# Patient Record
Sex: Female | Born: 1990 | Race: White | Hispanic: No | Marital: Married | Smoking: Former smoker
Health system: Southern US, Community
[De-identification: ages and names within clinical notes are randomized; demographics above are authoritative.]

## PROBLEM LIST (undated history)

## (undated) ENCOUNTER — Inpatient Hospital Stay (HOSPITAL_COMMUNITY): Payer: Self-pay

## (undated) DIAGNOSIS — F419 Anxiety disorder, unspecified: Secondary | ICD-10-CM

## (undated) DIAGNOSIS — G43909 Migraine, unspecified, not intractable, without status migrainosus: Secondary | ICD-10-CM

## (undated) DIAGNOSIS — T8859XA Other complications of anesthesia, initial encounter: Secondary | ICD-10-CM

## (undated) DIAGNOSIS — R945 Abnormal results of liver function studies: Secondary | ICD-10-CM

## (undated) DIAGNOSIS — F988 Other specified behavioral and emotional disorders with onset usually occurring in childhood and adolescence: Secondary | ICD-10-CM

## (undated) DIAGNOSIS — F32A Depression, unspecified: Secondary | ICD-10-CM

## (undated) DIAGNOSIS — F329 Major depressive disorder, single episode, unspecified: Secondary | ICD-10-CM

## (undated) DIAGNOSIS — E669 Obesity, unspecified: Secondary | ICD-10-CM

## (undated) DIAGNOSIS — F909 Attention-deficit hyperactivity disorder, unspecified type: Secondary | ICD-10-CM

## (undated) DIAGNOSIS — K5900 Constipation, unspecified: Secondary | ICD-10-CM

## (undated) DIAGNOSIS — M549 Dorsalgia, unspecified: Secondary | ICD-10-CM

## (undated) DIAGNOSIS — R011 Cardiac murmur, unspecified: Secondary | ICD-10-CM

## (undated) DIAGNOSIS — R7303 Prediabetes: Secondary | ICD-10-CM

## (undated) DIAGNOSIS — R079 Chest pain, unspecified: Secondary | ICD-10-CM

## (undated) DIAGNOSIS — K900 Celiac disease: Secondary | ICD-10-CM

## (undated) DIAGNOSIS — E559 Vitamin D deficiency, unspecified: Secondary | ICD-10-CM

## (undated) DIAGNOSIS — K760 Fatty (change of) liver, not elsewhere classified: Secondary | ICD-10-CM

## (undated) HISTORY — DX: Major depressive disorder, single episode, unspecified: F32.9

## (undated) HISTORY — DX: Constipation, unspecified: K59.00

## (undated) HISTORY — DX: Dorsalgia, unspecified: M54.9

## (undated) HISTORY — DX: Prediabetes: R73.03

## (undated) HISTORY — DX: Obesity, unspecified: E66.9

## (undated) HISTORY — DX: Attention-deficit hyperactivity disorder, unspecified type: F90.9

## (undated) HISTORY — DX: Anxiety disorder, unspecified: F41.9

## (undated) HISTORY — DX: Chest pain, unspecified: R07.9

## (undated) HISTORY — DX: Vitamin D deficiency, unspecified: E55.9

## (undated) HISTORY — DX: Abnormal results of liver function studies: R94.5

## (undated) HISTORY — DX: Cardiac murmur, unspecified: R01.1

## (undated) HISTORY — DX: Celiac disease: K90.0

## (undated) HISTORY — DX: Depression, unspecified: F32.A

## (undated) HISTORY — DX: Migraine, unspecified, not intractable, without status migrainosus: G43.909

## (undated) HISTORY — DX: Fatty (change of) liver, not elsewhere classified: K76.0

## (undated) HISTORY — DX: Other specified behavioral and emotional disorders with onset usually occurring in childhood and adolescence: F98.8

## (undated) HISTORY — DX: Other complications of anesthesia, initial encounter: T88.59XA

---
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
## 2012-07-09 ENCOUNTER — Emergency Department (HOSPITAL_BASED_OUTPATIENT_CLINIC_OR_DEPARTMENT_OTHER): Payer: BC Managed Care – PPO

## 2012-07-09 ENCOUNTER — Emergency Department (HOSPITAL_BASED_OUTPATIENT_CLINIC_OR_DEPARTMENT_OTHER)
Admission: EM | Admit: 2012-07-09 | Discharge: 2012-07-09 | Disposition: A | Discharge status: Home / Self Care | Payer: BC Managed Care – PPO | Attending: Emergency Medicine | Admitting: Emergency Medicine

## 2012-07-09 ENCOUNTER — Encounter (HOSPITAL_BASED_OUTPATIENT_CLINIC_OR_DEPARTMENT_OTHER): Payer: Self-pay | Admitting: *Deleted

## 2012-07-09 DIAGNOSIS — T07XXXA Unspecified multiple injuries, initial encounter: Secondary | ICD-10-CM

## 2012-07-09 DIAGNOSIS — R079 Chest pain, unspecified: Secondary | ICD-10-CM | POA: Insufficient documentation

## 2012-07-09 DIAGNOSIS — IMO0002 Reserved for concepts with insufficient information to code with codable children: Secondary | ICD-10-CM | POA: Insufficient documentation

## 2012-07-09 DIAGNOSIS — Y92410 Unspecified street and highway as the place of occurrence of the external cause: Secondary | ICD-10-CM | POA: Insufficient documentation

## 2012-07-09 MED ORDER — IBUPROFEN 800 MG PO TABS
800.0000 mg | ORAL_TABLET | Freq: Once | ORAL | Status: AC
  Administered 2012-07-09: 800 mg via ORAL
  Filled 2012-07-09: qty 1

## 2012-07-09 MED ORDER — METHOCARBAMOL 500 MG PO TABS: 1000.0000 mg | ORAL_TABLET | Freq: Four times a day (QID) | ORAL | Status: AC

## 2012-07-09 MED ORDER — IBUPROFEN 800 MG PO TABS: 800.0000 mg | ORAL_TABLET | Freq: Three times a day (TID) | ORAL | Status: AC | PRN

## 2012-07-09 NOTE — ED Notes (Signed)
Pt. Is in no distress with c/o scrapes on the inner L leg with noted gauze and tape.  Pt. Also has slight burn on the L wrist and c/o L thumb pain.
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
## 2012-07-11 NOTE — ED Provider Notes (Signed)
Medical screening examination/treatment/procedure(s) were performed by non-physician practitioner and as supervising physician I was immediately available for consultation/collaboration.    Dot Lanes, MD 07/11/12 2109

## 2014-05-30 ENCOUNTER — Ambulatory Visit (INDEPENDENT_AMBULATORY_CARE_PROVIDER_SITE_OTHER): Payer: 59 | Admitting: Family

## 2014-05-30 ENCOUNTER — Encounter: Payer: Self-pay | Admitting: Family

## 2014-05-30 ENCOUNTER — Ambulatory Visit (HOSPITAL_BASED_OUTPATIENT_CLINIC_OR_DEPARTMENT_OTHER)
Admission: RE | Admit: 2014-05-30 | Discharge: 2014-05-30 | Disposition: A | Discharge status: Home / Self Care | Payer: 59 | Source: Ambulatory Visit | Attending: Family | Admitting: Family

## 2014-05-30 VITALS — BP 100/78 | HR 70 | Temp 98.3°F | Resp 16 | Ht 69.5 in | Wt 240.0 lb

## 2014-05-30 DIAGNOSIS — E049 Nontoxic goiter, unspecified: Secondary | ICD-10-CM

## 2014-05-30 DIAGNOSIS — E010 Iodine-deficiency related diffuse (endemic) goiter: Secondary | ICD-10-CM | POA: Insufficient documentation

## 2014-05-30 DIAGNOSIS — G43909 Migraine, unspecified, not intractable, without status migrainosus: Secondary | ICD-10-CM | POA: Insufficient documentation

## 2014-05-30 DIAGNOSIS — Z0000 Encounter for general adult medical examination without abnormal findings: Secondary | ICD-10-CM | POA: Insufficient documentation

## 2014-05-30 DIAGNOSIS — E65 Localized adiposity: Secondary | ICD-10-CM | POA: Insufficient documentation

## 2014-05-30 DIAGNOSIS — H00019 Hordeolum externum unspecified eye, unspecified eyelid: Secondary | ICD-10-CM

## 2014-05-30 DIAGNOSIS — F909 Attention-deficit hyperactivity disorder, unspecified type: Secondary | ICD-10-CM | POA: Insufficient documentation

## 2014-05-30 LAB — CBC WITH DIFFERENTIAL/PLATELET
BASOS ABS: 0 10*3/uL (ref 0.0–0.1)
Basophils Relative: 0 % (ref 0–1)
EOS ABS: 0.1 10*3/uL (ref 0.0–0.7)
Eosinophils Relative: 1 % (ref 0–5)
HEMATOCRIT: 38.7 % (ref 36.0–46.0)
HEMOGLOBIN: 13.3 g/dL (ref 12.0–15.0)
LYMPHS PCT: 30 % (ref 12–46)
Lymphs Abs: 2.3 10*3/uL (ref 0.7–4.0)
MCH: 28.4 pg (ref 26.0–34.0)
MCHC: 34.4 g/dL (ref 30.0–36.0)
MCV: 82.5 fL (ref 78.0–100.0)
Monocytes Absolute: 0.6 10*3/uL (ref 0.1–1.0)
Monocytes Relative: 8 % (ref 3–12)
NEUTROS ABS: 4.6 10*3/uL (ref 1.7–7.7)
NEUTROS PCT: 61 % (ref 43–77)
Platelets: 313 10*3/uL (ref 150–400)
RBC: 4.69 MIL/uL (ref 3.87–5.11)
RDW: 13.4 % (ref 11.5–15.5)
WBC: 7.5 10*3/uL (ref 4.0–10.5)

## 2014-05-30 MED ORDER — DEXMETHYLPHENIDATE HCL ER 30 MG PO CP24: 1.0000 | ORAL_CAPSULE | Freq: Every day | ORAL | Status: DC

## 2014-05-30 MED ORDER — SUMATRIPTAN SUCCINATE 50 MG PO TABS: ORAL_TABLET | ORAL | Status: DC

## 2014-05-30 NOTE — Assessment & Plan Note (Signed)
Uncontrolled, add imitrex prn.

## 2014-05-30 NOTE — Assessment & Plan Note (Signed)
Advised pt on weight loss and to call if area enlarges or if she develops pain.

## 2014-05-30 NOTE — Assessment & Plan Note (Signed)
Discussed healthy diet, exercise, weight loss. Will request records from her previous provider. Pt reports shots are up to date. Form filled for camp.

## 2014-05-30 NOTE — Patient Instructions (Signed)
Please complete lab work prior to leaving. Complete neck ultrasound on the first floor.  Start imitrex as needed for migraines. Follow up in 3 months. Welcome to [facility]!

## 2014-05-30 NOTE — Progress Notes (Signed)
Subjective:    Patient ID: Kathryn Huff, female    DOB: December 18, 1991, 23 y.o.   MRN: [id number]  HPI  Ms. Kathryn Huff is a 23 yr old female who presents today to establish care.  1) ADHD- maintained on Focalin. Was followed at [facility].  Reports that she was diagnosed in 6th grade. Has been on current dose of focalin since freshman year of high school.  Works as Museum/gallery curator for ED.  2) Lipoma- Not causing pain.  First noticed about 1 year ago.  Not enlarging.    3) lesion on eye- not causing pain.    4) Migraines- started in HS. In HS she was treated with Nadolol which she stopped due to weight gain. Uses ibuprofen prn.  She has 3 a month.    OB/GYN prescribes nuvaring- followed at [facility].  Reports chicken pox age 68.  Patient presents today for complete physical.  Immunizations: reports up to date with pediatrician Diet: reports diet is healthy Exercise: bike rides once a week, gym 5 x a week. Body mass index is 34.95 kg/(m^2). Reports that her weight gain started Junior year of high school.   Pap Smear: reports that this was performed in July 2014- normal per pt.     Review of Systems  Constitutional: Negative for unexpected weight change.  HENT: Negative for hearing loss and postnasal drip.   Eyes: Negative for visual disturbance.  Respiratory: Negative for cough and shortness of breath.   Cardiovascular: Negative for chest pain and leg swelling.  Gastrointestinal: Negative for vomiting, diarrhea and constipation.  Genitourinary: Negative for dysuria, frequency and menstrual problem.  Musculoskeletal: Negative for arthralgias and joint swelling.  Skin:       Reports hx of eczema, uses steroid creams prn  Neurological: Positive for headaches.  Hematological: Negative for adenopathy.  Psychiatric/Behavioral:       Denies depression/anxiety   Past Medical History  Diagnosis Date  . ADD (attention deficit disorder)     History   Social History  . Marital  Status: Single    Spouse Name: N/A    Number of Children: N/A  . Years of Education: N/A   Occupational History  . Not on file.   Social History Main Topics  . Smoking status: Former Smoker -- 4 years  . Smokeless tobacco: Never Used  . Alcohol Use: Yes     Comment: occasional  . Drug Use: Not on file  . Sexual Activity: Not on file   Other Topics Concern  . 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 No pertinent past surgical history.  Family History  Problem Relation Age of Onset  . Diabetes Sister 5    Type 1  . Celiac disease Sister   . Cancer Paternal Grandmother     breast  . Cancer Paternal Grandfather     throat  . Heart Problems Sister     left ventricular non-compaction  . Diabetes Mellitus II Maternal Grandfather   . Diabetes Mellitus II Maternal Grandmother   . Breast cancer Paternal Grandmother   . Cancer Paternal Grandfather     throat and lung, smoker    No Known Allergies  Current Outpatient Prescriptions on File Prior to Visit  Medication Sig Dispense Refill  . etonogestrel-ethinyl estradiol (NUVARING) 0.12-0.015 MG/24HR vaginal ring Place 1 each vaginally every 28 (twenty-eight) days. Insert vaginally and leave in place for 3 consecutive weeks, then remove for 1 week.       No current facility-administered medications on file prior to visit.    BP 100/78  Pulse 70  Temp(Src) 98.3 F (36.8 C) (Oral)  Resp 16  Ht 5' 9.5" (1.765 m)  Wt 240 lb 0.6 oz (108.881 kg)  BMI 34.95 kg/m2  SpO2 99%  LMP 05/30/2014        Objective:   Physical Exam Physical Exam  Constitutional: She is oriented to person, place, and time. She appears well-developed and well-nourished. No distress.  HENT: right upper lash line- noted to have  enlarged follicle- no erythema no swelling Head: Normocephalic and atraumatic.  Right Ear: Tympanic membrane and ear canal normal.  Left Ear: Tympanic membrane and ear canal normal.  Mouth/Throat: Oropharynx is clear and moist.  Eyes: Pupils are equal, round, and reactive to light. No scleral icterus.  Neck: Normal range of motion. ? Right sided thyromegaly? Cardiovascular: Normal rate and regular rhythm.   No murmur heard. Pulmonary/Chest: Effort normal and breath sounds normal. No respiratory distress. He has no wheezes. She has no rales. She exhibits no tenderness.  Abdominal: Soft. Bowel sounds are normal. He exhibits no distension and no mass. There is no tenderness. There is no rebound and no guarding.  Musculoskeletal: She exhibits no edema. she does have a prominent fat pad at the base of her neck posteriorly.  Soft without discrete edges Lymphadenopathy:    She has no cervical adenopathy.  Neurological: She is alert and oriented to person, place, and time. She has normal reflexes. She exhibits normal muscle tone. Coordination normal.  Skin: Skin is warm and dry.  Psychiatric: She has a normal mood and affect. Her behavior is normal. Judgment and thought content normal.  Breast/pelvic: deferred.          Assessment & Plan:          Assessment & Plan:

## 2014-05-30 NOTE — Assessment & Plan Note (Signed)
Controlled substance contract was signed today. Pt provided with refill.

## 2014-05-30 NOTE — Progress Notes (Signed)
Pre visit review using our clinic review tool, if applicable. No additional management support is needed unless otherwise documented below in the visit note. 

## 2014-05-30 NOTE — Assessment & Plan Note (Signed)
Mild, no erythema, advised pt to call if area becomes more swollen or painful.

## 2014-05-30 NOTE — Assessment & Plan Note (Signed)
Obtain TSH and thyroid ultrasound.

## 2014-05-31 LAB — HEPATIC FUNCTION PANEL
ALBUMIN: 3.9 g/dL (ref 3.5–5.2)
ALT: 16 U/L (ref 0–35)
AST: 16 U/L (ref 0–37)
Alkaline Phosphatase: 79 U/L (ref 39–117)
BILIRUBIN INDIRECT: 0.3 mg/dL (ref 0.2–1.2)
Bilirubin, Direct: 0.1 mg/dL (ref 0.0–0.3)
Total Bilirubin: 0.4 mg/dL (ref 0.2–1.2)
Total Protein: 6.6 g/dL (ref 6.0–8.3)

## 2014-05-31 LAB — URINALYSIS, ROUTINE W REFLEX MICROSCOPIC
Bilirubin Urine: NEGATIVE
Glucose, UA: NEGATIVE mg/dL
Ketones, ur: NEGATIVE mg/dL
Leukocytes, UA: NEGATIVE
Nitrite: NEGATIVE
Protein, ur: NEGATIVE mg/dL
SPECIFIC GRAVITY, URINE: 1.024 (ref 1.005–1.030)
Urobilinogen, UA: 1 mg/dL (ref 0.0–1.0)
pH: 6 (ref 5.0–8.0)

## 2014-05-31 LAB — URINALYSIS, MICROSCOPIC ONLY
BACTERIA UA: NONE SEEN
CASTS: NONE SEEN
CRYSTALS: NONE SEEN

## 2014-05-31 LAB — TSH: TSH: 2.976 u[IU]/mL (ref 0.350–4.500)

## 2014-05-31 LAB — BASIC METABOLIC PANEL WITH GFR
BUN: 11 mg/dL (ref 6–23)
CHLORIDE: 105 meq/L (ref 96–112)
CO2: 25 mEq/L (ref 19–32)
CREATININE: 0.79 mg/dL (ref 0.50–1.10)
Calcium: 9 mg/dL (ref 8.4–10.5)
GFR, Est African American: >89 mL/min
GFR, Est Non African American: >89 mL/min
Glucose, Bld: 91 mg/dL (ref 70–99)
Potassium: 4.1 mEq/L (ref 3.5–5.3)
SODIUM: 138 meq/L (ref 135–145)

## 2014-05-31 LAB — LIPID PANEL
CHOL/HDL RATIO: 3.1 ratio
CHOLESTEROL: 170 mg/dL (ref 0–200)
HDL: 54 mg/dL (ref >39)
LDL CALC: 68 mg/dL (ref 0–99)
Triglycerides: 238 mg/dL — ABNORMAL HIGH (ref <150)
VLDL: 48 mg/dL — ABNORMAL HIGH (ref 0–40)

## 2014-05-31 LAB — HIV ANTIBODY (ROUTINE TESTING W REFLEX): HIV: NONREACTIVE

## 2014-07-02 ENCOUNTER — Telehealth: Payer: Self-pay | Admitting: *Deleted

## 2014-07-02 NOTE — Telephone Encounter (Signed)
Pt dropped off physical exam form requesting to be completed by 06/04/14 @ 7:30am. Form requires documentation of vision and hearing. Vision was not tested at pt's last visit. Left message for pt to return my call. Will need to bring pt in for nurse visit vision / hearing check.

## 2014-07-04 ENCOUNTER — Ambulatory Visit (INDEPENDENT_AMBULATORY_CARE_PROVIDER_SITE_OTHER): Payer: 59 | Admitting: Family

## 2014-07-04 DIAGNOSIS — Z0000 Encounter for general adult medical examination without abnormal findings: Secondary | ICD-10-CM

## 2014-07-04 NOTE — Telephone Encounter (Signed)
Patient came in office around 7:10am on 07.16.15 to have hearing & vision screening done, when she asked about the completion of the form being done also at that time she was informed that Forms require time to be completed by provider and she had only given [name] less than 24 hours to have this done; also that her provider was not in the office on Thursdays. She stated that she needed them that day to not lose her place in class at school; apologized and explained to the patient that she needed to have brung in the paperwork when she had received it [facility] for one week]. Pt placed call to mother, thanked [name] and took the paperwork with her/SLS

## 2014-07-04 NOTE — Progress Notes (Signed)
Pt here to complete hearing / vision screening for college form. Pt will pick up form on Tuesday morning.

## 2014-07-08 ENCOUNTER — Telehealth: Payer: Self-pay | Admitting: Family

## 2014-07-08 DIAGNOSIS — Z0000 Encounter for general adult medical examination without abnormal findings: Secondary | ICD-10-CM

## 2014-07-08 NOTE — Telephone Encounter (Signed)
Patient states that she needs a TB bloodtest for school. Does patient need to be seen or can she just come in for labs? Thanks!

## 2014-07-08 NOTE — Telephone Encounter (Signed)
Pt notified and order signed.

## 2014-07-08 NOTE — Telephone Encounter (Signed)
See lab order

## 2014-07-14 ENCOUNTER — Encounter: Payer: Self-pay | Admitting: Family

## 2014-07-14 LAB — QUANTIFERON TB GOLD ASSAY (BLOOD)
INTERFERON GAMMA RELEASE ASSAY: NEGATIVE
QUANTIFERON NIL VALUE: 0.02 [IU]/mL
Quantiferon Tb Ag Minus Nil Value: 0.08 IU/mL
TB Ag value: 0.1 IU/mL

## 2014-07-16 NOTE — Telephone Encounter (Signed)
Please advise pt that tb testing is negative.

## 2014-07-18 NOTE — Telephone Encounter (Signed)
Left message for pt to return my call.

## 2014-09-02 ENCOUNTER — Ambulatory Visit: Payer: 59 | Admitting: Family

## 2014-09-02 ENCOUNTER — Telehealth: Payer: Self-pay | Admitting: Family

## 2014-09-02 DIAGNOSIS — Z0289 Encounter for other administrative examinations: Secondary | ICD-10-CM

## 2014-09-02 MED ORDER — DEXMETHYLPHENIDATE HCL ER 30 MG PO CP24: 1.0000 | ORAL_CAPSULE | Freq: Every day | ORAL | Status: DC

## 2014-09-02 NOTE — Telephone Encounter (Signed)
Signed.

## 2014-09-02 NOTE — Telephone Encounter (Signed)
focalin 30 mg  She only has one pill left

## 2014-09-02 NOTE — Telephone Encounter (Signed)
Pt r/s today's appt for 09/10/14. Last Rx printed 05/30/14. Rx printed and forwarded to Provider for signature.

## 2014-09-02 NOTE — Telephone Encounter (Signed)
Left message for pt to return my call. Rx sent to front desk for pick up.

## 2014-09-03 NOTE — Telephone Encounter (Signed)
Notified pt. 

## 2014-09-10 ENCOUNTER — Encounter: Payer: Self-pay | Admitting: Family

## 2014-09-10 ENCOUNTER — Ambulatory Visit (INDEPENDENT_AMBULATORY_CARE_PROVIDER_SITE_OTHER): Payer: 59 | Admitting: Family

## 2014-09-10 VITALS — BP 104/63 | HR 69 | Temp 98.3°F | Resp 16 | Ht 69.5 in | Wt 240.0 lb

## 2014-09-10 DIAGNOSIS — E010 Iodine-deficiency related diffuse (endemic) goiter: Secondary | ICD-10-CM

## 2014-09-10 DIAGNOSIS — F909 Attention-deficit hyperactivity disorder, unspecified type: Secondary | ICD-10-CM

## 2014-09-10 DIAGNOSIS — E781 Pure hyperglyceridemia: Secondary | ICD-10-CM | POA: Insufficient documentation

## 2014-09-10 DIAGNOSIS — G43909 Migraine, unspecified, not intractable, without status migrainosus: Secondary | ICD-10-CM

## 2014-09-10 DIAGNOSIS — E049 Nontoxic goiter, unspecified: Secondary | ICD-10-CM

## 2014-09-10 DIAGNOSIS — Z23 Encounter for immunization: Secondary | ICD-10-CM

## 2014-09-10 NOTE — Patient Instructions (Signed)
Please return fasting for follow up cholesterol and Urine toxicology.

## 2014-09-10 NOTE — Assessment & Plan Note (Signed)
Stable on focalin. She will return for urine drug tox per protocol.

## 2014-09-10 NOTE — Progress Notes (Signed)
Subjective:    Patient ID: Kathryn Huff, female    DOB: 01/09/1991, 23 y.o.   MRN: [id number]  HPI  Ms. Kathryn Huff is a 23 yr old female who presents today for follow up.  1) Migraine- added imitrex last visit.  Reports that imitrex is "really helping."  2) hypertriglyceridemia- trigs 238 last visit. Reports improved diet and addition of fish oil.   3) Thyromegaly- US showed normal sized thyroid and TSH was normal.    4) ADHD- maintained on focalin. Reports well controlled.     Review of Systems    see HPI  Past Medical History  Diagnosis Date  . ADD (attention deficit disorder)     History   Social History  . Marital Status: Single    Spouse Name: N/A    Number of Children: N/A  . Years of Education: N/A   Occupational History  . Not on file.   Social History Main Topics  . Smoking status: Former Smoker -- 4 years  . Smokeless tobacco: Never Used  . Alcohol Use: Yes     Comment: occasional  . Drug Use: Not on file  . Sexual Activity: Not on file   Other Topics Concern  . 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  Family History  Problem Relation Age of Onset  . Diabetes Sister 5    Type 1  . Celiac disease Sister   . Cancer Paternal Grandmother     breast  . Cancer Paternal Grandfather     throat  . Heart Problems Sister     left ventricular non-compaction  . Diabetes Mellitus II Maternal Grandfather   . Diabetes Mellitus II Maternal Grandmother   . Breast cancer Paternal Grandmother   . Cancer Paternal Grandfather     throat and lung, smoker    No Known Allergies  Current Outpatient Prescriptions on File Prior to Visit  Medication Sig Dispense Refill  . Dexmethylphenidate HCl (FOCALIN XR) 30 MG CP24 Take 1  capsule (30 mg total) by mouth daily.  30 capsule  0  . etonogestrel-ethinyl estradiol (NUVARING) 0.12-0.015 MG/24HR vaginal ring Place 1 each vaginally every 28 (twenty-eight) days. Insert vaginally and leave in place for 3 consecutive weeks, then remove for 1 week.      . SUMAtriptan (IMITREX) 50 MG tablet One tab at start of migraine. May repeat in 2 hours if headache persists or recurs.  10 tablet  2   No current facility-administered medications on file prior to visit.    BP 104/63  Pulse 69  Temp(Src) 98.3 F (36.8 C) (Oral)  Resp 16  Ht 5' 9.5" (1.765 m)  Wt 240 lb (108.863 kg)  BMI 34.95 kg/m2  SpO2 99%    Objective:   Physical Exam  Constitutional: She is oriented to person, place, and time. She appears well-developed and well-nourished. No distress.  HENT:  Head: Normocephalic and atraumatic.  Cardiovascular: Normal rate and regular rhythm.   No murmur heard. Pulmonary/Chest: Effort normal and breath sounds normal. No respiratory distress. She has no wheezes. She has no rales. She exhibits no tenderness.  Neurological: She is alert and oriented to  person, place, and time.  Psychiatric: She has a normal mood and affect. Her behavior is normal. Judgment and thought content normal.          Assessment & Plan:  She reports that she had 2D echo 2-3 yrs ago which was performed at [facility]. Reports that this was normal. (+ family hx of cardiac non-compaction). Will request 2D echo.

## 2014-09-10 NOTE — Progress Notes (Signed)
Pre visit review using our clinic review tool, if applicable. No additional management support is needed unless otherwise documented below in the visit note/SLS  

## 2014-09-10 NOTE — Assessment & Plan Note (Signed)
Stable with PRN imitrex.

## 2014-09-10 NOTE — Assessment & Plan Note (Signed)
US showed normal thyroid.

## 2014-09-10 NOTE — Assessment & Plan Note (Signed)
Improved diet, return fasting for follow up lipid panel.  Continue fish oil.

## 2014-09-12 ENCOUNTER — Other Ambulatory Visit: Payer: 59

## 2014-11-11 ENCOUNTER — Telehealth: Payer: Self-pay | Admitting: Family

## 2014-11-11 NOTE — Telephone Encounter (Signed)
Last rx printed 09/02/14 and pt has f/u 02/2015. Father is not on current HIPPA. Left pt message to return my call. Need to verify it is ok to release Rx to her father and does she want [name] to add him as a HIPPA contact?

## 2014-11-11 NOTE — Telephone Encounter (Signed)
Caller name: Chrissie Noa Relation to pt: father Call back number: [phone] Pharmacy:  Reason for call:   Patient father requesting a new focalin rx

## 2014-11-12 MED ORDER — DEXMETHYLPHENIDATE HCL ER 30 MG PO CP24: 1.0000 | ORAL_CAPSULE | Freq: Every day | ORAL | Status: DC

## 2014-11-12 NOTE — Telephone Encounter (Signed)
Spoke with pt and received verbal ok to release HIPPA to father and mother. Updated HIPPA contact. She is aware that Rx will not be signed until Monday and pt voices understanding. Rx printed and forwarded to PRovider for signature.

## 2015-02-12 ENCOUNTER — Telehealth: Payer: Self-pay | Admitting: Family

## 2015-02-12 MED ORDER — DEXMETHYLPHENIDATE HCL ER 30 MG PO CP24: 1.0000 | ORAL_CAPSULE | Freq: Every day | ORAL | Status: DC

## 2015-02-12 NOTE — Telephone Encounter (Signed)
Rx placed at front desk for pick up and left message with Debbie.

## 2015-02-12 NOTE — Telephone Encounter (Signed)
Caller name:Brookelle Maines Relationship to patient:Self  Pharmacy: [facility]  Reason for call: PT requesting refill of  Dexmethylphenidate HCl (FOCALIN XR) 30 MG CP24 [[id number]]

## 2015-02-12 NOTE — Telephone Encounter (Signed)
Last rx printed 11/12/14 and pt has f/u 03/02/15. Rx printed and forwarded to Provider for signature.

## 2015-03-02 ENCOUNTER — Ambulatory Visit: Payer: 59 | Admitting: Family

## 2015-03-06 ENCOUNTER — Ambulatory Visit (INDEPENDENT_AMBULATORY_CARE_PROVIDER_SITE_OTHER): Payer: 59 | Admitting: Family

## 2015-03-06 ENCOUNTER — Encounter: Payer: Self-pay | Admitting: Family

## 2015-03-06 VITALS — BP 116/79 | HR 76 | Temp 97.8°F | Resp 18 | Ht 69.5 in | Wt 249.6 lb

## 2015-03-06 DIAGNOSIS — G43909 Migraine, unspecified, not intractable, without status migrainosus: Secondary | ICD-10-CM

## 2015-03-06 DIAGNOSIS — Z832 Family history of diseases of the blood and blood-forming organs and certain disorders involving the immune mechanism: Secondary | ICD-10-CM

## 2015-03-06 DIAGNOSIS — E781 Pure hyperglyceridemia: Secondary | ICD-10-CM

## 2015-03-06 DIAGNOSIS — F909 Attention-deficit hyperactivity disorder, unspecified type: Secondary | ICD-10-CM

## 2015-03-06 DIAGNOSIS — Z8379 Family history of other diseases of the digestive system: Secondary | ICD-10-CM

## 2015-03-06 LAB — LIPID PANEL
CHOLESTEROL: 142 mg/dL (ref 0–200)
HDL: 50.6 mg/dL (ref >39.00)
LDL CALC: 63 mg/dL (ref 0–99)
NonHDL: 91.4
TRIGLYCERIDES: 142 mg/dL (ref 0.0–149.0)
Total CHOL/HDL Ratio: 3
VLDL: 28.4 mg/dL (ref 0.0–40.0)

## 2015-03-06 MED ORDER — SUMATRIPTAN SUCCINATE 50 MG PO TABS: ORAL_TABLET | ORAL | Status: DC

## 2015-03-06 MED ORDER — DEXMETHYLPHENIDATE HCL ER 30 MG PO CP24: 1.0000 | ORAL_CAPSULE | Freq: Every day | ORAL | Status: DC

## 2015-03-06 NOTE — Progress Notes (Signed)
Pre visit review using our clinic review tool, if applicable. No additional management support is needed unless otherwise documented below in the visit note. 

## 2015-03-06 NOTE — Progress Notes (Signed)
Subjective:    Patient ID: Kathryn Huff, female    DOB: Oct 31, 1991, 24 y.o.   MRN: [id number]  HPI  Kathryn Huff is a 24 yr old female who presents today for follow of multiple medical problems:  1) ADHD- reports that she is doing well on current dose of focalin, full time student and working, only takes on the days she has school.  2) Migraines- good relief with imitrex.  Has migraines 1x a week, trying to reduce caffeine.  3) Hypertriglyceridemia- not taking fish oil. Lab Results  Component Value Date   CHOL 170 05/30/2014   HDL 54 05/30/2014   LDLCALC 68 05/30/2014   TRIG 238* 05/30/2014   CHOLHDL 3.1 05/30/2014   4) Family hx of factor V leiden- would like testing.   Sister has celiac disease- denies abdominal bloating, abdominal pain, does have chronic constipation. She would like further testing.   Review of Systems See HPI  Past Medical History  Diagnosis Date  . ADD (attention deficit disorder)     History   Social History  . Marital Status: Single    Spouse Name: N/A  . Number of Children: N/A  . Years of Education: N/A   Occupational History  . Not on file.   Social History Main Topics  . Smoking status: Former Smoker -- 4 years  . Smokeless tobacco: Never Used  . Alcohol Use: Yes     Comment: occasional  . Drug Use: Not on file  . Sexual Activity: Not on file   Other Topics Concern  . 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 No pertinent past surgical history.  Family History  Problem Relation Age of Onset  . Diabetes Sister 5    Type 1  . Celiac disease Sister   . Cancer Paternal Grandmother     breast  . Breast cancer Paternal Grandmother   . Heart Problems Sister     left ventricular non-compaction  . Diabetes  Mellitus II Maternal Grandfather   . Diabetes Mellitus II Maternal Grandmother   . Factor V Leiden deficiency Maternal Grandmother   . Cancer Paternal Grandfather     throat/throat and lung, smoker  . Factor V Leiden deficiency Mother     No Known Allergies  Current Outpatient Prescriptions on File Prior to Visit  Medication Sig Dispense Refill  . Dexmethylphenidate HCl (FOCALIN XR) 30 MG CP24 Take 1 capsule (30 mg total) by mouth daily. 30 capsule 0  . etonogestrel-ethinyl estradiol (NUVARING) 0.12-0.015 MG/24HR vaginal ring Place 1 each vaginally every 28 (twenty-eight) days. Insert vaginally and leave in place for 3 consecutive weeks, then remove for 1 week.    . SUMAtriptan (IMITREX) 50 MG tablet One tab at start of migraine. May repeat in 2 hours if headache persists or recurs. 10 tablet 2   No current facility-administered medications on file prior to visit.    BP 116/79 mmHg  Pulse 76  Temp(Src) 97.8 F (36.6 C) (Oral)  Resp 18  Ht 5' 9.5" (1.765 m)  Wt 249 lb 9.6 oz (113.218 kg)  BMI 36.34 kg/m2  SpO2 99%  LMP 02/05/2015       Objective:  Physical Exam  Constitutional: She is oriented to person, place, and time. She appears well-developed and well-nourished.  Cardiovascular: Normal rate, regular rhythm and normal heart sounds.   No murmur heard. Pulmonary/Chest: Effort normal and breath sounds normal. No respiratory distress. She has no wheezes.  Abdominal: Soft. She exhibits no distension.  Musculoskeletal: She exhibits no edema.  Neurological: She is alert and oriented to person, place, and time.  Psychiatric: She has a normal mood and affect. Her behavior is normal. Judgment and thought content normal.          Assessment & Plan:  Due to family hx of factor V leiden, will obtain testing to rule out.   Will also obtain testing due to family hx of celiac disease

## 2015-03-06 NOTE — Patient Instructions (Signed)
Please complete lab work prior to leaving. Follow up in 3 months.  

## 2015-03-07 ENCOUNTER — Encounter: Payer: Self-pay | Admitting: Family

## 2015-03-07 NOTE — Assessment & Plan Note (Signed)
Well controlled, continue current dose of focalin.

## 2015-03-07 NOTE — Assessment & Plan Note (Signed)
Stable with prn use of imitrex.

## 2015-03-09 LAB — GLIADIN ANTIBODIES, SERUM
Gliadin IgA: 20 Units — ABNORMAL HIGH (ref <20)
Gliadin IgG: 28 Units — ABNORMAL HIGH (ref <20)

## 2015-03-09 LAB — TISSUE TRANSGLUTAMINASE, IGA: TISSUE TRANSGLUTAMINASE AB, IGA: 3 U/mL (ref <4)

## 2015-03-10 LAB — FACTOR 5 LEIDEN

## 2015-03-10 LAB — RETICULIN ANTIBODIES, IGA W TITER: Reticulin Ab, IgA: NEGATIVE

## 2015-03-12 ENCOUNTER — Telehealth: Payer: Self-pay | Admitting: Family

## 2015-03-13 ENCOUNTER — Encounter: Payer: Self-pay | Admitting: Family

## 2015-03-20 NOTE — Telephone Encounter (Signed)
Opened in error

## 2015-03-27 NOTE — Telephone Encounter (Signed)
pls contact pt re: unread message.

## 2015-03-30 NOTE — Telephone Encounter (Signed)
Mailed letter °

## 2015-04-06 ENCOUNTER — Telehealth: Payer: Self-pay | Admitting: Family

## 2015-04-06 DIAGNOSIS — Z8379 Family history of other diseases of the digestive system: Secondary | ICD-10-CM

## 2015-04-06 NOTE — Telephone Encounter (Signed)
Caller name:  Lashya Relation to pt: self Call back number: [phone] Pharmacy:  Reason for call:   Patient received letter that we sent. Is wanting to proceed with GI for further eval.

## 2015-06-05 ENCOUNTER — Ambulatory Visit: Payer: 59 | Admitting: Family

## 2015-06-08 ENCOUNTER — Ambulatory Visit (INDEPENDENT_AMBULATORY_CARE_PROVIDER_SITE_OTHER): Payer: 59 | Admitting: Family

## 2015-06-08 ENCOUNTER — Encounter: Payer: Self-pay | Admitting: Family

## 2015-06-08 VITALS — BP 104/80 | HR 84 | Temp 98.1°F | Resp 16 | Ht 69.5 in | Wt 259.8 lb

## 2015-06-08 DIAGNOSIS — G43909 Migraine, unspecified, not intractable, without status migrainosus: Secondary | ICD-10-CM | POA: Diagnosis not present

## 2015-06-08 DIAGNOSIS — F909 Attention-deficit hyperactivity disorder, unspecified type: Secondary | ICD-10-CM | POA: Diagnosis not present

## 2015-06-08 MED ORDER — SUMATRIPTAN SUCCINATE 50 MG PO TABS: ORAL_TABLET | ORAL | Status: DC

## 2015-06-08 MED ORDER — DEXMETHYLPHENIDATE HCL ER 30 MG PO CP24: 1.0000 | ORAL_CAPSULE | Freq: Every day | ORAL | Status: DC

## 2015-06-08 NOTE — Progress Notes (Signed)
Pre visit review using our clinic review tool, if applicable. No additional management support is needed unless otherwise documented below in the visit note. 

## 2015-06-08 NOTE — Progress Notes (Signed)
Subjective:    Patient ID: Kathryn Huff, female    DOB: [birth year], 24 y.o.   MRN: [id number]  HPI   [name] is a 24 yr old female who presents today for follow up of ADHD.  She is maintained on focalin XR. She is taking only 4 days a week on the days when she has class and clinical. Her last dose was this AM.    She was tested for Factor V leiden last visit and tested negative for the mutation.   GI- has not yet scheduled GI apt due to school apt to follow up on abnormal celiac testing   Migraines- reports migraines are rare.  Uses imitrex which helps.   Review of Systems     Past Medical History  Diagnosis Date  . ADD (attention deficit disorder)     History   Social History  . Marital Status: Single    Spouse Name: N/A  . Number of Children: N/A  . Years of Education: N/A   Occupational History  . Not on file.   Social History Main Topics  . Smoking status: Former Smoker -- 4 years  . Smokeless tobacco: Never Used  . Alcohol Use: Yes     Comment: occasional  . Drug Use: Not on file  . Sexual Activity: Not on file   Other Topics Concern  . 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  Family History  Problem Relation Age of Onset  . Diabetes Sister 5    Type 1  . Celiac disease Sister   . Cancer Paternal Grandmother     breast  . Breast cancer Paternal Grandmother   . Heart Problems Sister     left ventricular non-compaction  . Diabetes Mellitus II Maternal Grandfather   . Diabetes Mellitus II Maternal Grandmother   . Factor V Leiden deficiency Maternal Grandmother   . Cancer Paternal Grandfather     throat/throat and lung, smoker  . Factor V Leiden deficiency Mother     No Known Allergies  Current Outpatient  Prescriptions on File Prior to Visit  Medication Sig Dispense Refill  . Dexmethylphenidate HCl (FOCALIN XR) 30 MG CP24 Take 1 capsule (30 mg total) by mouth daily. Do not refill prior to 2/25 30 capsule 0  . etonogestrel-ethinyl estradiol (NUVARING) 0.12-0.015 MG/24HR vaginal ring Place 1 each vaginally every 28 (twenty-eight) days. Insert vaginally and leave in place for 3 consecutive weeks, then remove for 1 week.    . SUMAtriptan (IMITREX) 50 MG tablet One tab at start of migraine. May repeat in 2 hours if headache persists or recurs. 10 tablet 2   No current facility-administered medications on file prior to visit.    BP 104/80 mmHg  Pulse 84  Temp(Src) 98.1 F (36.7 C) (Oral)  Resp 16  Ht 5' 9.5" (1.765 m)  Wt 259 lb 12.8 oz (117.845 kg)  BMI 37.83 kg/m2  LMP 06/02/2015    Objective:   Physical Exam  Constitutional: She is oriented to person, place, and time. She appears well-developed and well-nourished.  HENT:  Head: Normocephalic.  Cardiovascular: Normal rate, regular rhythm and normal heart sounds.  No murmur heard. Pulmonary/Chest: Effort normal and breath sounds normal. No respiratory distress. She has no wheezes.  Neurological: She is alert and oriented to person, place, and time.  Psychiatric: She has a normal mood and affect. Her behavior is normal. Judgment and thought content normal.          Assessment & Plan:

## 2015-06-08 NOTE — Assessment & Plan Note (Signed)
Stable in prn imitrex.

## 2015-06-08 NOTE — Assessment & Plan Note (Addendum)
Stable on focalin. Continue same.

## 2015-06-08 NOTE — Patient Instructions (Addendum)
Please complete lab work prior to leaving. (UDS). Schedule annual physical in August.

## 2015-06-30 ENCOUNTER — Telehealth: Payer: Self-pay | Admitting: Family

## 2015-06-30 NOTE — Telephone Encounter (Addendum)
pre visit letter mailed 06/30/15 °

## 2015-07-20 ENCOUNTER — Telehealth: Payer: Self-pay | Admitting: Behavioral Health

## 2015-07-20 NOTE — Telephone Encounter (Signed)
Unable to reach patient at time of Pre-Visit Call.  Left message for patient to return call when available.    

## 2015-07-21 ENCOUNTER — Encounter: Payer: 59 | Admitting: Family

## 2015-07-21 DIAGNOSIS — Z0289 Encounter for other administrative examinations: Secondary | ICD-10-CM

## 2015-07-22 ENCOUNTER — Telehealth: Payer: Self-pay | Admitting: Family

## 2015-07-22 ENCOUNTER — Ambulatory Visit (INDEPENDENT_AMBULATORY_CARE_PROVIDER_SITE_OTHER): Payer: 59

## 2015-07-22 ENCOUNTER — Other Ambulatory Visit: Payer: 59

## 2015-07-22 DIAGNOSIS — Z111 Encounter for screening for respiratory tuberculosis: Secondary | ICD-10-CM

## 2015-07-22 NOTE — Telephone Encounter (Signed)
Patient forgot about her appointment for 07/21/2015 patient [facility] 08/26/2015

## 2015-07-22 NOTE — Progress Notes (Signed)
Pre visit review using our clinic review tool, if applicable. No additional management support is needed unless otherwise documented below in the visit note. 

## 2015-07-23 NOTE — Telephone Encounter (Signed)
Charge for no show?

## 2015-07-23 NOTE — Telephone Encounter (Signed)
Yes

## 2015-07-25 LAB — QUANTIFERON TB GOLD ASSAY (BLOOD)
Interferon Gamma Release Assay: NEGATIVE
Mitogen value: 9.15 IU/mL
Quantiferon Nil Value: 0.02 IU/mL
Quantiferon Tb Ag Minus Nil Value: 0.06 IU/mL
TB Ag value: 0.08 IU/mL

## 2015-07-27 NOTE — Progress Notes (Signed)
Notified pt and she voices understanding. 

## 2015-08-05 ENCOUNTER — Telehealth: Payer: Self-pay | Admitting: Family

## 2015-08-05 NOTE — Telephone Encounter (Signed)
pre visit letter mailed 08/05/15

## 2015-08-07 ENCOUNTER — Ambulatory Visit (INDEPENDENT_AMBULATORY_CARE_PROVIDER_SITE_OTHER): Payer: 59 | Admitting: Physician Assistant

## 2015-08-07 ENCOUNTER — Encounter: Payer: Self-pay | Admitting: Physician Assistant

## 2015-08-07 VITALS — BP 110/90 | HR 94 | Temp 98.2°F | Ht 69.5 in | Wt 258.8 lb

## 2015-08-07 DIAGNOSIS — N3000 Acute cystitis without hematuria: Secondary | ICD-10-CM

## 2015-08-07 LAB — POCT URINALYSIS DIPSTICK
BILIRUBIN UA: NEGATIVE
Glucose, UA: NEGATIVE
KETONES UA: NEGATIVE
Nitrite, UA: POSITIVE
PH UA: 6
SPEC GRAV UA: 1.02
Urobilinogen, UA: 1

## 2015-08-07 MED ORDER — NITROFURANTOIN MACROCRYSTAL 100 MG PO CAPS: 100.0000 mg | ORAL_CAPSULE | Freq: Four times a day (QID) | ORAL | Status: DC

## 2015-08-07 MED ORDER — FLUCONAZOLE 150 MG PO TABS: 150.0000 mg | ORAL_TABLET | Freq: Once | ORAL | Status: DC

## 2015-08-07 NOTE — Progress Notes (Signed)
  PCP:O'SULLIVAN,MELISSA S., NP Chief Complaint  Patient presents with  . Urinary Frequency    low back pain,slight burning after urining-sxs started on Wed night    Current Issues:  Presents with 2 days of dysuria, urinary urgency and urinary frequency Associated symptoms include:  chills, cloudy urine and low back pain  There is a previous history of of similar symptoms. Sexually active:  Yes with female.   No concern for STI.  Prior to Admission medications   Medication Sig Start Date End Date Taking? Authorizing Provider  Dexmethylphenidate HCl (FOCALIN XR) 30 MG CP24 Take 1 capsule (30 mg total) by mouth daily. Do not refill prior to 2/25 06/08/15  Yes Debbrah Alar, NP  etonogestrel-ethinyl estradiol (NUVARING) 0.12-0.015 MG/24HR vaginal ring Place 1 each vaginally every 28 (twenty-eight) days. Insert vaginally and leave in place for 3 consecutive weeks, then remove for 1 week.   Yes Historical Provider, MD  SUMAtriptan (IMITREX) 50 MG tablet One tab at start of migraine. May repeat in 2 hours if headache persists or recurs. 06/08/15  Yes Debbrah Alar, NP   Pertinent ROS are listed in the HPI.  PE:  BP 110/90 mmHg  Pulse 94  Temp(Src) 98.2 F (36.8 C) (Oral)  Ht 5' 9.5" (1.765 m)  Wt 258 lb 12.8 oz (117.391 kg)  BMI 37.68 kg/m2  SpO2 98%  LMP 07/28/2015  General appearance: alert, cooperative, appears stated age and no distress Lungs: clear to auscultation bilaterally Heart: regular rate and rhythm, S1, S2 normal, no murmur, click, rub or gallop Abdomen: soft, non-tender; bowel sounds normal; no masses,  no organomegaly   Results for orders placed or performed in visit on 07/22/15  Quantiferon tb gold assay  Result Value Ref Range   Interferon Gamma Release Assay NEGATIVE NEGATIVE   TB Ag value 0.08 IU/mL   Quantiferon Nil Value 0.02 IU/mL   Mitogen value 9.15 IU/mL   Quantiferon Tb Ag Minus Nil Value 0.06 IU/mL    Assessment and Plan:   Problem List  Items Addressed This Visit      Genitourinary   Acute cystitis without hematuria [N30.00] - Primary    Rx Macrobid. Urine sent for culture. Increase fluids, AZO, probiotic. Will alter antibiotic regimen based on results.      Relevant Medications   nitrofurantoin (MACRODANTIN) 100 MG capsule   fluconazole (DIFLUCAN) 150 MG tablet

## 2015-08-07 NOTE — Patient Instructions (Signed)
Your symptoms are consistent with a bladder infection, also called acute cystitis. Please take your antibiotic (Macrobid) as directed until all pills are gone.  Stay very well hydrated.  Consider a daily probiotic (Align, Culturelle, or Activia) to help prevent stomach upset caused by the antibiotic.  Taking a probiotic daily may also help prevent recurrent UTIs.  Also consider taking AZO (Phenazopyridine) tablets to help decrease pain with urination.  I will call you with your urine testing results.  We will change antibiotics if indicated.  Call or return to clinic if symptoms are not resolved by completion of antibiotic.   Urinary Tract Infection A urinary tract infection (UTI) can occur any place along the urinary tract. The tract includes the kidneys, ureters, bladder, and urethra. A type of germ called bacteria often causes a UTI. UTIs are often helped with antibiotic medicine.  HOME CARE   If given, take antibiotics as told by your doctor. Finish them even if you start to feel better.  Drink enough fluids to keep your pee (urine) clear or pale yellow.  Avoid tea, drinks with caffeine, and bubbly (carbonated) drinks.  Pee often. Avoid holding your pee in for a long time.  Pee before and after having sex (intercourse).  Wipe from front to back after you poop (bowel movement) if you are a woman. Use each tissue only once. GET HELP RIGHT AWAY IF:   You have back pain.  You have lower belly (abdominal) pain.  You have chills.  You feel sick to your stomach (nauseous).  You throw up (vomit).  Your burning or discomfort with peeing does not go away.  You have a fever.  Your symptoms are not better in 3 days. MAKE SURE YOU:   Understand these instructions.  Will watch your condition.  Will get help right away if you are not doing well or get worse. Document Released: 05/23/2008 Document Revised: 08/29/2012 Document Reviewed: 07/05/2012 [facility] Patient Information 2015  [place], [place]. This information is not intended to replace advice given to you by your health care provider. Make sure you discuss any questions you have with your health care provider.

## 2015-08-07 NOTE — Progress Notes (Signed)
Pre visit review using our clinic review tool, if applicable. No additional management support is needed unless otherwise documented below in the visit note. 

## 2015-08-07 NOTE — Assessment & Plan Note (Signed)
Rx Macrobid. Urine sent for culture. Increase fluids, AZO, probiotic. Will alter antibiotic regimen based on results.

## 2015-08-07 NOTE — Addendum Note (Signed)
Addended by: Harl Bowie on: 08/07/2015 04:39 PM   Modules accepted: Orders

## 2015-08-10 LAB — CULTURE, URINE COMPREHENSIVE: Colony Count: >=100000

## 2015-08-11 ENCOUNTER — Telehealth: Payer: Self-pay | Admitting: Family

## 2015-08-11 NOTE — Telephone Encounter (Signed)
°  Relation to PN:DLOP Call back number:[phone] Pharmacy:  Reason for call: pt is returning the call regarding her lab results, pt states that she is in [place] and not available until 3:00 please call back

## 2015-08-11 NOTE — Telephone Encounter (Signed)
Patient informed of her urine test results and PCP instructions.

## 2015-08-25 ENCOUNTER — Telehealth: Payer: Self-pay | Admitting: Behavioral Health

## 2015-08-25 NOTE — Telephone Encounter (Signed)
Unable to reach patient at time of Pre-Visit Call.  Left message for patient to return call when available.    

## 2015-08-26 ENCOUNTER — Ambulatory Visit (INDEPENDENT_AMBULATORY_CARE_PROVIDER_SITE_OTHER): Payer: 59 | Admitting: Family

## 2015-08-26 ENCOUNTER — Telehealth: Payer: Self-pay | Admitting: Family

## 2015-08-26 ENCOUNTER — Encounter: Payer: Self-pay | Admitting: Family

## 2015-08-26 VITALS — BP 102/70 | HR 74 | Temp 98.2°F | Resp 18 | Ht 69.5 in | Wt 253.0 lb

## 2015-08-26 DIAGNOSIS — Z23 Encounter for immunization: Secondary | ICD-10-CM

## 2015-08-26 DIAGNOSIS — Z0000 Encounter for general adult medical examination without abnormal findings: Secondary | ICD-10-CM | POA: Diagnosis not present

## 2015-08-26 LAB — URINALYSIS, ROUTINE W REFLEX MICROSCOPIC
Bilirubin Urine: NEGATIVE
Ketones, ur: NEGATIVE
Leukocytes, UA: NEGATIVE
Nitrite: NEGATIVE
PH: 6 (ref 5.0–8.0)
SPECIFIC GRAVITY, URINE: 1.025 (ref 1.000–1.030)
Total Protein, Urine: NEGATIVE
UROBILINOGEN UA: 0.2 (ref 0.0–1.0)
Urine Glucose: NEGATIVE
WBC, UA: NONE SEEN (ref 0–?)

## 2015-08-26 LAB — CBC WITH DIFFERENTIAL/PLATELET
BASOS ABS: 0 10*3/uL (ref 0.0–0.1)
Basophils Relative: 0.5 % (ref 0.0–3.0)
EOS ABS: 0.1 10*3/uL (ref 0.0–0.7)
Eosinophils Relative: 0.8 % (ref 0.0–5.0)
HEMATOCRIT: 38.8 % (ref 36.0–46.0)
HEMOGLOBIN: 13 g/dL (ref 12.0–15.0)
LYMPHS PCT: 28.1 % (ref 12.0–46.0)
Lymphs Abs: 2.6 10*3/uL (ref 0.7–4.0)
MCHC: 33.5 g/dL (ref 30.0–36.0)
MCV: 84.9 fl (ref 78.0–100.0)
Monocytes Absolute: 0.7 10*3/uL (ref 0.1–1.0)
Monocytes Relative: 7.3 % (ref 3.0–12.0)
NEUTROS ABS: 5.8 10*3/uL (ref 1.4–7.7)
Neutrophils Relative %: 63.3 % (ref 43.0–77.0)
PLATELETS: 276 10*3/uL (ref 150.0–400.0)
RBC: 4.57 Mil/uL (ref 3.87–5.11)
RDW: 13.3 % (ref 11.5–15.5)
WBC: 9.2 10*3/uL (ref 4.0–10.5)

## 2015-08-26 LAB — BASIC METABOLIC PANEL
BUN: 14 mg/dL (ref 6–23)
CHLORIDE: 105 meq/L (ref 96–112)
CO2: 27 mEq/L (ref 19–32)
Calcium: 9.1 mg/dL (ref 8.4–10.5)
Creatinine, Ser: 0.76 mg/dL (ref 0.40–1.20)
GFR: 99.66 mL/min (ref >60.00)
Glucose, Bld: 101 mg/dL — ABNORMAL HIGH (ref 70–99)
POTASSIUM: 4.4 meq/L (ref 3.5–5.1)
Sodium: 139 mEq/L (ref 135–145)

## 2015-08-26 LAB — TSH: TSH: 3.72 u[IU]/mL (ref 0.35–4.50)

## 2015-08-26 LAB — LIPID PANEL
CHOLESTEROL: 179 mg/dL (ref 0–200)
HDL: 51.7 mg/dL (ref >39.00)
LDL CALC: 96 mg/dL (ref 0–99)
NonHDL: 127.65
TRIGLYCERIDES: 157 mg/dL — AB (ref 0.0–149.0)
Total CHOL/HDL Ratio: 3
VLDL: 31.4 mg/dL (ref 0.0–40.0)

## 2015-08-26 LAB — HEPATIC FUNCTION PANEL
ALBUMIN: 3.7 g/dL (ref 3.5–5.2)
ALK PHOS: 91 U/L (ref 39–117)
ALT: 22 U/L (ref 0–35)
AST: 15 U/L (ref 0–37)
BILIRUBIN DIRECT: 0.1 mg/dL (ref 0.0–0.3)
TOTAL PROTEIN: 6.9 g/dL (ref 6.0–8.3)
Total Bilirubin: 0.4 mg/dL (ref 0.2–1.2)

## 2015-08-26 NOTE — Assessment & Plan Note (Signed)
Encouraged pt to continue healthy diet, exercise weight loss. Flu shot today.  Obtain routine lab work.

## 2015-08-26 NOTE — Patient Instructions (Signed)
Please complete lab work prior to leaving. Keep up the good work with healthy diet, exercise, weight loss efforts.  Follow up in 1 year for complete physical, sooner if problems/concerns.

## 2015-08-26 NOTE — Progress Notes (Signed)
Subjective:    Patient ID: Kathryn Huff, female    DOB: Apr 28, 1991, 24 y.o.   MRN: [id number]  HPI  Kathryn Huff is a 24 yr old female who presents today for cpx.  Immunizations: flu shot today. Unsure of last tetanus- believes up to date.  Diet: reports that she has been watching her diet Exercise:wi fit and walking.  Wt Readings from Last 3 Encounters:  08/26/15 253 lb (114.76 kg)  08/07/15 258 lb 12.8 oz (117.391 kg)  06/08/15 259 lb 12.8 oz (117.845 kg)  Pap Smear: 9/15 Vision:  Up to date  Review of Systems  Constitutional: Negative for unexpected weight change.  HENT: Negative for hearing loss.        Mild nasal congestion/uri  Eyes: Negative for visual disturbance.  Respiratory: Negative for cough and shortness of breath.   Cardiovascular: Negative for chest pain.  Gastrointestinal: Negative for nausea, vomiting and diarrhea.  Genitourinary: Negative for dysuria and frequency.  Musculoskeletal: Negative for myalgias and arthralgias.  Skin: Negative for rash.  Neurological: Negative for headaches.  Hematological: Negative for adenopathy.  Psychiatric/Behavioral:       Denies depression/anxiety   Past Medical History  Diagnosis Date  . ADD (attention deficit disorder)     Social History   Social History  . Marital Status: Single    Spouse Name: N/A  . Number of Children: N/A  . Years of Education: N/A   Occupational History  . Not on file.   Social History Main Topics  . Smoking status: Former Smoker -- 4 years  . Smokeless tobacco: Never Used  . Alcohol Use: Yes     Comment: occasional  . Drug Use: Not on file  . Sexual Activity: Not on file   Other Topics Concern  . 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  Family  History  Problem Relation Age of Onset  . Diabetes Sister 5    Type 1  . Celiac disease Sister   . Cancer Paternal Grandmother     breast  . Breast cancer Paternal Grandmother   . Heart Problems Sister     left ventricular non-compaction  . Diabetes Mellitus II Maternal Grandfather   . Diabetes Mellitus II Maternal Grandmother   . Factor V Leiden deficiency Maternal Grandmother   . Cancer Paternal Grandfather     throat/throat and lung, smoker  . Factor V Leiden deficiency Mother     No Known Allergies  Current Outpatient Prescriptions on File Prior to Visit  Medication Sig Dispense Refill  . Dexmethylphenidate HCl (FOCALIN XR) 30 MG CP24 Take 1 capsule (30 mg total) by mouth daily. Do not refill prior to 2/25 30 capsule 0  . etonogestrel-ethinyl estradiol (NUVARING) 0.12-0.015 MG/24HR vaginal ring Place 1 each vaginally every 28 (twenty-eight) days. Insert vaginally and leave in place for 3 consecutive weeks, then remove for 1 week.    . SUMAtriptan (IMITREX) 50 MG tablet One tab at start of migraine. May repeat in 2 hours if headache persists or recurs. 10 tablet 5   No current facility-administered medications on file prior to visit.    BP 102/70 mmHg  Pulse 74  Temp(Src) 98.2 F (36.8 C) (Oral)  Resp 18  Ht 5' 9.5" (1.765 m)  Wt 253 lb (114.76 kg)  BMI 36.84 kg/m2  SpO2 99%  LMP 08/26/2015       Objective:   Physical Exam  Physical Exam  Constitutional: pleasant overweight white female. She is oriented to person, place, and time. She appears well-developed and well-nourished. No distress.  HENT:  Head: Normocephalic and atraumatic.  Right Ear: Tympanic membrane and ear canal normal.  Left Ear: Tympanic membrane and ear canal normal.  Mouth/Throat: Oropharynx is clear and moist.  Eyes: Pupils are equal, round, and reactive to light. No scleral icterus.  Neck: Normal range of motion. No thyromegaly present.  Cardiovascular: Normal rate and regular rhythm.     No murmur heard. Pulmonary/Chest: Effort normal and breath sounds normal. No respiratory distress. He has no wheezes. She has no rales. She exhibits no tenderness.  Abdominal: Soft. Bowel sounds are normal. He exhibits no distension and no mass. There is no tenderness. There is no rebound and no guarding.  Musculoskeletal: She exhibits no edema.  Lymphadenopathy:    She has no cervical adenopathy.  Neurological: She is alert and oriented to person, place, and time. She has normal patellar reflexes. She exhibits normal muscle tone. Coordination normal.  Skin: Skin is warm and dry.  Psychiatric: She has a normal mood and affect. Her behavior is normal. Judgment and thought content normal.  Breasts: Examined lying Right: Without masses, retractions, discharge or axillary adenopathy.  Left: Without masses, retractions, discharge or axillary adenopathy.         Assessment & Plan:         Assessment & Plan:

## 2015-08-26 NOTE — Progress Notes (Signed)
Pre visit review using our clinic review tool, if applicable. No additional management support is needed unless otherwise documented below in the visit note. 

## 2015-08-26 NOTE — Telephone Encounter (Signed)
Opened in error

## 2015-08-27 ENCOUNTER — Encounter: Payer: Self-pay | Admitting: Family

## 2015-10-28 ENCOUNTER — Telehealth: Payer: Self-pay | Admitting: Family

## 2015-10-28 MED ORDER — DEXMETHYLPHENIDATE HCL ER 30 MG PO CP24: 1.0000 | ORAL_CAPSULE | Freq: Every day | ORAL | Status: DC

## 2015-10-28 NOTE — Telephone Encounter (Signed)
Pt needing refill on Dexmethylphenidate. Taking last one today. Was not using regularly before but is back to using daily. Please call cell # [phone] when RX is ready for pick up.

## 2015-10-28 NOTE — Telephone Encounter (Signed)
Rx placed at front desk for pick up. Left detailed message on cell# re: below and to be prepared to leave UDS at time of pick up.

## 2015-10-28 NOTE — Telephone Encounter (Signed)
Rx printed. Pt due for repeat UDS and will need to provide sample when she picks up Rx.  Rx sent to PCP for signature.

## 2015-12-20 DIAGNOSIS — E559 Vitamin D deficiency, unspecified: Secondary | ICD-10-CM

## 2015-12-20 HISTORY — DX: Vitamin D deficiency, unspecified: E55.9

## 2015-12-22 DIAGNOSIS — Z113 Encounter for screening for infections with a predominantly sexual mode of transmission: Secondary | ICD-10-CM | POA: Diagnosis not present

## 2015-12-22 DIAGNOSIS — Z01419 Encounter for gynecological examination (general) (routine) without abnormal findings: Secondary | ICD-10-CM | POA: Diagnosis not present

## 2016-01-28 ENCOUNTER — Telehealth: Payer: Self-pay | Admitting: Family

## 2016-01-28 ENCOUNTER — Other Ambulatory Visit: Payer: Self-pay | Admitting: Family

## 2016-01-29 MED ORDER — DEXMETHYLPHENIDATE HCL ER 30 MG PO CP24: 1.0000 | ORAL_CAPSULE | Freq: Every day | ORAL | Status: DC

## 2016-01-29 NOTE — Telephone Encounter (Signed)
Last Rx 10/28/15, Next uds due now and pt has f/u 02/23/16 and will do UDS at that time. Rx printed and forwarded to PCP for signature.

## 2016-01-29 NOTE — Telephone Encounter (Signed)
Rx placed at front desk for pick up and notification sent to pt via mychart.

## 2016-02-05 MED ORDER — DEXMETHYLPHENIDATE HCL ER 30 MG PO CP24: 1.0000 | ORAL_CAPSULE | Freq: Every day | ORAL | Status: DC

## 2016-02-05 MED FILL — DEXMETHYLPHENIDATE ER 30 MG: 30 | 30 days supply | Qty: 30 | Fill #0

## 2016-02-05 NOTE — Telephone Encounter (Signed)
Rx printed on 01/29/16 had incorrect notation "not to fill before 2/25". Last Rx was 10/27/16. Rx reprinted and signed by PA, Hassell Done and given to pt.

## 2016-02-05 NOTE — Addendum Note (Signed)
Addended by: Kelle Darting A on: 02/05/2016 04:21 PM   Modules accepted: Orders

## 2016-02-11 MED FILL — NUVARING VAGINAL RING: 0.12-0.015 | 84 days supply | Qty: 3 | Fill #0

## 2016-02-23 ENCOUNTER — Encounter: Payer: Self-pay | Admitting: Family

## 2016-02-23 ENCOUNTER — Ambulatory Visit (INDEPENDENT_AMBULATORY_CARE_PROVIDER_SITE_OTHER): Payer: 59 | Admitting: Family

## 2016-02-23 ENCOUNTER — Ambulatory Visit (HOSPITAL_BASED_OUTPATIENT_CLINIC_OR_DEPARTMENT_OTHER)
Admission: RE | Admit: 2016-02-23 | Discharge: 2016-02-23 | Disposition: A | Discharge status: Home / Self Care | Payer: 59 | Source: Ambulatory Visit | Attending: Family | Admitting: Family

## 2016-02-23 VITALS — BP 120/66 | HR 69 | Temp 98.1°F | Ht 70.0 in | Wt 258.1 lb

## 2016-02-23 DIAGNOSIS — E781 Pure hyperglyceridemia: Secondary | ICD-10-CM

## 2016-02-23 DIAGNOSIS — M7989 Other specified soft tissue disorders: Secondary | ICD-10-CM | POA: Diagnosis not present

## 2016-02-23 DIAGNOSIS — M79642 Pain in left hand: Secondary | ICD-10-CM | POA: Diagnosis not present

## 2016-02-23 DIAGNOSIS — F909 Attention-deficit hyperactivity disorder, unspecified type: Secondary | ICD-10-CM

## 2016-02-23 DIAGNOSIS — G43909 Migraine, unspecified, not intractable, without status migrainosus: Secondary | ICD-10-CM | POA: Diagnosis not present

## 2016-02-23 MED ORDER — SUMATRIPTAN SUCCINATE 50 MG PO TABS: ORAL_TABLET | ORAL | Status: DC

## 2016-02-23 MED ORDER — DEXMETHYLPHENIDATE HCL ER 30 MG PO CP24: 1.0000 | ORAL_CAPSULE | Freq: Every day | ORAL | Status: DC

## 2016-02-23 MED FILL — SUMATRIPTAN SUCC 50 MG TAB: 50 | 30 days supply | Qty: 9 | Fill #0

## 2016-02-23 NOTE — Patient Instructions (Signed)
Please complete x ray on the first floor. Work on diet/exercise/weight loss.

## 2016-02-23 NOTE — Assessment & Plan Note (Signed)
Stable with prn use of imitrex.

## 2016-02-23 NOTE — Assessment & Plan Note (Signed)
Discussed diet  

## 2016-02-23 NOTE — Assessment & Plan Note (Signed)
Stable on  Focalin, continue same.

## 2016-02-23 NOTE — Progress Notes (Signed)
Pre visit review using our clinic review tool, if applicable. No additional management support is needed unless otherwise documented below in the visit note. 

## 2016-02-23 NOTE — Progress Notes (Signed)
Subjective:    Patient ID: Kathryn Huff, female    DOB: [birth year], 25 y.o.   MRN: [id number]  HPI  Kathryn Huff is a 25 yr old female who presents today for follow up.  1) ADHD- maintained on focalin. reporrts that she is taking focalin daily and this continues to help her focus.   2) Migraines- uses imitrex prn.  Uses imitrex 2 x a month.  Imitrex controls her HA's.   3) Hypertriglyceridemia- reports that her diet is poor due to stress.  She is exercising.  Wt Readings from Last 3 Encounters:  02/23/16 258 lb 2 oz (117.085 kg)  08/26/15 253 lb (114.76 kg)  08/07/15 258 lb 12.8 oz (117.391 kg)   4) L hand injury- closed hand in a sliding door yesterday.  Increased pain with flexion. Reports dull pain in the left 3rd/4th fingers which radiate to the left wrist.     Review of Systems    see HPI  Past Medical History  Diagnosis Date  . ADD (attention deficit disorder)     Social History   Social History  . Marital Status: Single    Spouse Name: N/A  . Number of Children: N/A  . Years of Education: N/A   Occupational History  . Not on file.   Social History Main Topics  . Smoking status: Former Smoker -- 4 years  . Smokeless tobacco: Never Used  . Alcohol Use: Yes     Comment: occasional  . Drug Use: Not on file  . Sexual Activity: Not on file   Other Topics Concern  . 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  Family History  Problem Relation Age of Onset  . Diabetes Sister 5    Type 1  . Celiac disease Sister   . Cancer Paternal Grandmother     breast  . Breast cancer Paternal Grandmother   . Heart Problems Sister     left ventricular non-compaction  . Diabetes Mellitus II Maternal Grandfather   . Diabetes Mellitus  II Maternal Grandmother   . Factor V Leiden deficiency Maternal Grandmother   . Cancer Paternal Grandfather     throat/throat and lung, smoker  . Factor V Leiden deficiency Mother     No Known Allergies  Current Outpatient Prescriptions on File Prior to Visit  Medication Sig Dispense Refill  . Dexmethylphenidate HCl (FOCALIN XR) 30 MG CP24 Take 1 capsule (30 mg total) by mouth daily. 30 capsule 0  . etonogestrel-ethinyl estradiol (NUVARING) 0.12-0.015 MG/24HR vaginal ring Place 1 each vaginally every 28 (twenty-eight) days. Insert vaginally and leave in place for 3 consecutive weeks, then remove for 1 week.    . SUMAtriptan (IMITREX) 50 MG tablet One tab at start of migraine. May repeat in 2 hours if headache persists or recurs. 10 tablet 5   No current facility-administered medications on file prior to visit.    BP 120/66 mmHg  Pulse 69  Temp(Src) 98.1 F (36.7 C) (Oral)  Ht 5' 10"  (1.778 m)  Wt 258 lb 2 oz (117.085 kg)  BMI 37.04 kg/m2  SpO2 95%  LMP 02/08/2016 (Exact Date)    Objective:  Physical Exam  Constitutional: She is oriented to person, place, and time. She appears well-developed and well-nourished.  Cardiovascular: Normal rate, regular rhythm and normal heart sounds.   No murmur heard. Pulmonary/Chest: Effort normal and breath sounds normal. No respiratory distress. She has no wheezes.  Musculoskeletal:  + bruising/swelling left dorsal hand.  Neurological: She is alert and oriented to person, place, and time.  Psychiatric: She has a normal mood and affect. Her behavior is normal. Judgment and thought content normal.          Assessment & Plan:  L hand pain- will obtain x ray to rule out fracture.

## 2016-04-19 ENCOUNTER — Telehealth: Payer: Self-pay | Admitting: Family

## 2016-04-19 MED ORDER — DEXMETHYLPHENIDATE HCL ER 30 MG PO CP24: 1.0000 | ORAL_CAPSULE | Freq: Every day | ORAL | Status: DC

## 2016-04-19 NOTE — Telephone Encounter (Signed)
Caller name: Self  Can be reached: [phone]  Pharmacy:  [facility] [place] [facility] [place] [phone] (Phone) [phone] (Fax)         Reason for call: Refill on Dexmethylphenidate HCl (FOCALIN XR) 30 MG CP24 [[id number]]

## 2016-04-19 NOTE — Telephone Encounter (Signed)
Last focalin Rx 02/23/16, #30 Last UDS:  10/2015 and due now for repeat Next OV:  09/14/16  Rx printed and forwarded to PCP for signature. Will need UDS when she picks up Rx.

## 2016-04-19 NOTE — Telephone Encounter (Signed)
Rx placed at front desk for pick up and pt has been notified.

## 2016-05-02 ENCOUNTER — Other Ambulatory Visit: Payer: 59

## 2016-05-02 DIAGNOSIS — Z79899 Other long term (current) drug therapy: Secondary | ICD-10-CM | POA: Diagnosis not present

## 2016-05-02 MED FILL — DEXMETHYLPHENIDATE ER 30 MG: 30 | 30 days supply | Qty: 30 | Fill #0

## 2016-05-05 MED FILL — NUVARING VAGINAL RING: 0.12-0.015 | 84 days supply | Qty: 3 | Fill #1

## 2016-05-18 ENCOUNTER — Encounter: Payer: Self-pay | Admitting: Family

## 2016-06-09 ENCOUNTER — Other Ambulatory Visit (HOSPITAL_COMMUNITY)
Admission: RE | Admit: 2016-06-09 | Discharge: 2016-06-09 | Disposition: A | Discharge status: Home / Self Care | Payer: 59 | Source: Ambulatory Visit | Attending: Family Medicine | Admitting: Family Medicine

## 2016-06-09 ENCOUNTER — Ambulatory Visit (INDEPENDENT_AMBULATORY_CARE_PROVIDER_SITE_OTHER): Payer: 59 | Admitting: Family Medicine

## 2016-06-09 ENCOUNTER — Encounter: Payer: Self-pay | Admitting: Family Medicine

## 2016-06-09 VITALS — BP 100/78 | HR 89 | Temp 97.8°F | Ht 69.5 in | Wt 263.8 lb

## 2016-06-09 DIAGNOSIS — R300 Dysuria: Secondary | ICD-10-CM | POA: Diagnosis not present

## 2016-06-09 DIAGNOSIS — N920 Excessive and frequent menstruation with regular cycle: Secondary | ICD-10-CM

## 2016-06-09 DIAGNOSIS — N898 Other specified noninflammatory disorders of vagina: Secondary | ICD-10-CM | POA: Diagnosis not present

## 2016-06-09 DIAGNOSIS — N760 Acute vaginitis: Secondary | ICD-10-CM | POA: Insufficient documentation

## 2016-06-09 LAB — POCT URINALYSIS DIP (MANUAL ENTRY)
Bilirubin, UA: NEGATIVE
GLUCOSE UA: NEGATIVE
Ketones, POC UA: NEGATIVE
Leukocytes, UA: NEGATIVE
NITRITE UA: NEGATIVE
Protein Ur, POC: NEGATIVE
Spec Grav, UA: >=1.03
UROBILINOGEN UA: 4
pH, UA: 6

## 2016-06-09 LAB — POCT URINE PREGNANCY: PREG TEST UR: NEGATIVE

## 2016-06-09 MED ORDER — FLUCONAZOLE 150 MG PO TABS: 150.0000 mg | ORAL_TABLET | Freq: Once | ORAL | Status: DC

## 2016-06-09 MED ORDER — NITROFURANTOIN MONOHYD MACRO 100 MG PO CAPS: 100.0000 mg | ORAL_CAPSULE | Freq: Two times a day (BID) | ORAL | Status: DC

## 2016-06-09 MED FILL — NITROFURANTOIN MONO-MCR 100: 100 | 7 days supply | Qty: 14 | Fill #0

## 2016-06-09 MED FILL — FLUCONAZOLE 150 MG TABLET: 150 | 7 days supply | Qty: 2 | Fill #0

## 2016-06-09 NOTE — Progress Notes (Signed)
Ed[facility] Endave at [facility] [address] [place], [place] [address] [phone]  Date:  06/09/2016   Name:  Latima Hamza   DOB:  July 07, 1991   MRN:  [id number]  PCP:  Nance Pear., NP    Chief Complaint: Urinary Tract Infection   History of Present Illness:  Bentli Llorente is a 25 y.o. very pleasant female patient who presents with the following:  Generally healthy young lady with history of ADHD. Here today with compalint of possible UTI.  She noted dysuria about 5 days ago- took cranberry juice and this resolved.  She then noted onset of possible yeast infection with discharge a couple of days later. She started an OTC monistat 7 day pack yesterday.  This am she felt painful urination again, became concerned and decided to come in  No vomiting or fever.  She did have some lower belly pain last night.   No back pain LMP - she uses the ring continuously. She just placed her 2nd continuous ring a week ago and is having some spotting this week.    She is getting married in about 2 months.  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 Acute cystitis without hematuria [N30.00] 08/07/2015  . Hypertriglyceridemia 09/10/2014  . Routine general medical examination at a health care facility 05/30/2014  . ADHD (attention deficit hyperactivity disorder) 05/30/2014  . Fat pad 05/30/2014  . Migraine 05/30/2014    Past Medical History  Diagnosis Date  . ADD (attention deficit disorder)     No past surgical history on file.  Social History  Substance Use Topics  . Smoking status: Former Smoker -- 4 years  . Smokeless tobacco: Never Used  . Alcohol Use: Yes     Comment: occasional    Family History  Problem Relation Age of Onset  . Diabetes Sister 5    Type 1  . Celiac disease Sister   . Cancer Paternal Grandmother     breast  . Breast cancer Paternal  Grandmother   . Heart Problems Sister     left ventricular non-compaction  . Diabetes Mellitus II Maternal Grandfather   . Diabetes Mellitus II Maternal Grandmother   . Factor V Leiden deficiency Maternal Grandmother   . Cancer Paternal Grandfather     throat/throat and lung, smoker  . Factor V Leiden deficiency Mother     No Known Allergies  Medication list has been reviewed and updated.  Current Outpatient Prescriptions on File Prior to Visit  Medication Sig Dispense Refill  . Dexmethylphenidate HCl (FOCALIN XR) 30 MG CP24 Take 1 capsule (30 mg total) by mouth daily. Do not fill prior to 03/02/16 30 capsule 0  . etonogestrel-ethinyl estradiol (NUVARING) 0.12-0.015 MG/24HR vaginal ring Place 1 each vaginally every 28 (twenty-eight) days. Insert vaginally and leave in place for 3 consecutive weeks, then remove for 1 week.    . SUMAtriptan (IMITREX) 50 MG tablet One tab at start of migraine. May repeat in 2 hours if headache persists or recurs. 10 tablet 5   No current facility-administered medications on file prior to visit.    Review of Systems: Filed Vitals:   06/09/16 1459  BP: 100/78  Pulse: 89  Temp: 97.8 F (36.6 C)    As per HPI- otherwise negative.   Physical Examination: There were no vitals filed for this visit.Ideal Body  Weight:    GEN: WDWN, NAD, Non-toxic, A & O x 3, looks well HEENT: Atraumatic, Normocephalic. Neck supple. No masses, No LAD. Ears and Nose: No external deformity. CV: RRR, No M/G/R. No JVD. No thrill. No extra heart sounds. PULM: CTA B, no wheezes, crackles, rhonchi. No retractions. No resp. distress. No accessory muscle use. ABD: S, NT, ND EXTR: No c/c/e NEURO Normal gait.  PSYCH: Normally interactive. Conversant. Not depressed or anxious appearing.  Calm demeanor.  Breast: normal exam, no masses/ dimpling/ discharge Pelvic: normal vulva, no vaginal lesions. Uterus normal, no CMT, no adnexal tendereness or masses She does have slight  bright red spotting from the cervix.  The vagina is slightly inflamed with some thick white discharge  Results for orders placed or performed in visit on 06/09/16  POCT urinalysis dipstick  Result Value Ref Range   Color, UA yellow yellow   Clarity, UA clear clear   Glucose, UA negative negative   Bilirubin, UA negative negative   Ketones, POC UA negative negative   Spec Grav, UA >=1.030    Blood, UA small (A) negative   pH, UA 6.0    Protein Ur, POC negative negative   Urobilinogen, UA 4.0    Nitrite, UA Negative Negative   Leukocytes, UA Negative Negative  POCT urine pregnancy  Result Value Ref Range   Preg Test, Ur Negative Negative     Assessment and Plan: Dysuria - Plan: POCT urinalysis dipstick, Urine culture, nitrofurantoin, macrocrystal-monohydrate, (MACROBID) 100 MG capsule  Menstrual spotting - Plan: POCT urine pregnancy  Vaginal discharge - Plan: Cervicovaginal ancillary only, fluconazole (DIFLUCAN) 150 MG tablet  Here today with sx of UTI and also possibly yeast vaginitis.  Will treat with macrobid and also diflucan while we await further lab results.   She will let me know if not feeling better in the next few days- Sooner if worse.     Signed Lamar Blinks, MD

## 2016-06-09 NOTE — Progress Notes (Signed)
Pre visit review using our clinic review tool, if applicable. No additional management support is needed unless otherwise documented below in the visit note. 

## 2016-06-09 NOTE — Patient Instructions (Signed)
We will treat you for a UTI and also yeast infection with macrobid and diflucan.   You may be spotting from using the ring continuously.  If this is bothersome you can remove the ring early, have a full bleed and then place a new ring in one week.   I will be in touch with your yeast infection test and urine culture

## 2016-06-10 LAB — URINE CULTURE

## 2016-06-13 LAB — CERVICOVAGINAL ANCILLARY ONLY: WET PREP (BD AFFIRM): NEGATIVE

## 2016-06-23 ENCOUNTER — Encounter: Payer: Self-pay | Admitting: Family Medicine

## 2016-07-29 MED FILL — NUVARING VAGINAL RING: 0.12-0.015 | 84 days supply | Qty: 3 | Fill #2

## 2016-09-14 ENCOUNTER — Encounter: Payer: Self-pay | Admitting: Family

## 2016-09-14 ENCOUNTER — Ambulatory Visit (INDEPENDENT_AMBULATORY_CARE_PROVIDER_SITE_OTHER): Payer: 59 | Admitting: Family

## 2016-09-14 VITALS — BP 104/58 | HR 72 | Temp 97.7°F | Resp 18 | Ht 70.0 in | Wt 267.8 lb

## 2016-09-14 DIAGNOSIS — E781 Pure hyperglyceridemia: Secondary | ICD-10-CM

## 2016-09-14 DIAGNOSIS — E785 Hyperlipidemia, unspecified: Secondary | ICD-10-CM | POA: Diagnosis not present

## 2016-09-14 DIAGNOSIS — F909 Attention-deficit hyperactivity disorder, unspecified type: Secondary | ICD-10-CM | POA: Diagnosis not present

## 2016-09-14 DIAGNOSIS — G43909 Migraine, unspecified, not intractable, without status migrainosus: Secondary | ICD-10-CM | POA: Diagnosis not present

## 2016-09-14 LAB — LIPID PANEL
CHOL/HDL RATIO: 3
CHOLESTEROL: 146 mg/dL (ref 0–200)
HDL: 54.6 mg/dL (ref >39.00)
LDL Cholesterol: 57 mg/dL (ref 0–99)
NonHDL: 91.1
TRIGLYCERIDES: 171 mg/dL — AB (ref 0.0–149.0)
VLDL: 34.2 mg/dL (ref 0.0–40.0)

## 2016-09-14 MED ORDER — SUMATRIPTAN SUCCINATE 50 MG PO TABS: ORAL_TABLET | ORAL | 5 refills | Status: DC

## 2016-09-14 NOTE — Assessment & Plan Note (Signed)
She has had some weight gain. We discussed diet today. She recently got married.  They plan to begin cooking healthier meals.

## 2016-09-14 NOTE — Assessment & Plan Note (Signed)
Continues to have HA's but not severe.  Refill provided for prn imitrex.

## 2016-09-14 NOTE — Progress Notes (Signed)
Subjective:    Patient ID: Kathryn Huff, female    DOB: November 21, 1991, 25 y.o.   MRN: [id number]  HPI  Kathryn Huff is a 25 yr old female who presents today for follow up.  1) ADHD- pt is maintained on focalin xr. ony using some times.    2) Hypertriglyceridemia-  Lab Results  Component Value Date   CHOL 179 08/26/2015   HDL 51.70 08/26/2015   LDLCALC 96 08/26/2015   TRIG 157.0 (H) 08/26/2015   CHOLHDL 3 08/26/2015   3) Migraine-  Reports that she has had more frequent headaches. She ran out of imitrex but has managing with ibuprofen.   Wt Readings from Last 3 Encounters:  09/14/16 267 lb 12.8 oz (121.5 kg)  06/09/16 263 lb 12.8 oz (119.7 kg)  02/23/16 258 lb 2 oz (117.1 kg)      Review of Systems See HPI  Past Medical History:  Diagnosis Date  . ADD (attention deficit disorder)      Social History   Social History  . Marital status: Single    Spouse name: N/A  . Number of children: N/A  . Years of education: N/A   Occupational History  . Not on file.   Social History Main Topics  . Smoking status: Former Smoker    Years: 4.00  . Smokeless tobacco: Never Used  . Alcohol use Yes     Comment: occasional  . Drug use: Unknown  . Sexual activity: Not on file   Other Topics Concern  . 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  Family History  Problem Relation Age of Onset  . Diabetes Sister 5    Type 1  . Celiac disease Sister   . Cancer Paternal Grandmother     breast  . Breast cancer Paternal Grandmother   . Heart Problems Sister     left ventricular non-compaction  . Diabetes Mellitus II Maternal Grandfather   . Diabetes Mellitus II Maternal Grandmother   . Factor V Leiden deficiency Maternal Grandmother   .  Cancer Paternal Grandfather     throat/throat and lung, smoker  . Factor V Leiden deficiency Mother     No Known Allergies  Current Outpatient Prescriptions on File Prior to Visit  Medication Sig Dispense Refill  . Dexmethylphenidate HCl (FOCALIN XR) 30 MG CP24 Take 1 capsule (30 mg total) by mouth daily. Do not fill prior to 03/02/16 30 capsule 0  . etonogestrel-ethinyl estradiol (NUVARING) 0.12-0.015 MG/24HR vaginal ring Place 1 each vaginally every 28 (twenty-eight) days. Insert vaginally and leave in place for 3 consecutive weeks, then remove for 1 week.     No current facility-administered medications on file prior to visit.     BP (!) 104/58 (BP Location: Right Arm, Cuff Size: Large)   Pulse 72   Temp 97.7 F (36.5 C) (Oral)   Resp 18   Ht 5' 10"  (1.778 m)   Wt 267 lb 12.8 oz (121.5 kg)   SpO2 97% Comment: room air  BMI 38.43 kg/m       Objective:   Physical Exam  Constitutional: She is oriented to person, place, and time.  She appears well-developed and well-nourished.  HENT:  Head: Normocephalic and atraumatic.  Cardiovascular: Normal rate, regular rhythm and normal heart sounds.   No murmur heard. Pulmonary/Chest: Effort normal and breath sounds normal. No respiratory distress. She has no wheezes.  Neurological: She is alert and oriented to person, place, and time.  Skin: Skin is warm and dry.  Psychiatric: She has a normal mood and affect. Her behavior is normal. Judgment and thought content normal.          Assessment & Plan:  Had a flu shot at work.

## 2016-09-14 NOTE — Assessment & Plan Note (Signed)
Stable. Only using focalin as needed, has not felt that she has needed it recently.

## 2016-09-14 NOTE — Progress Notes (Signed)
Pre visit review using our clinic review tool, if applicable. No additional management support is needed unless otherwise documented below in the visit note. 

## 2016-09-14 NOTE — Patient Instructions (Signed)
Please complete lab work prior to leaving.   

## 2016-09-14 NOTE — Addendum Note (Signed)
Addended by: Peggyann Shoals on: 09/14/2016 08:27 AM   Modules accepted: Orders

## 2016-10-21 MED FILL — NUVARING VAGINAL RING: 0.12-0.015 | 84 days supply | Qty: 3 | Fill #3

## 2017-01-12 MED FILL — NUVARING VAGINAL RING: 0.12-0.015 | 28 days supply | Qty: 1 | Fill #0

## 2017-02-09 ENCOUNTER — Encounter (INDEPENDENT_AMBULATORY_CARE_PROVIDER_SITE_OTHER): Payer: Self-pay | Admitting: Family Medicine

## 2017-02-16 LAB — HM PAP SMEAR: HM PAP: NORMAL

## 2017-02-23 DIAGNOSIS — Z3169 Encounter for other general counseling and advice on procreation: Secondary | ICD-10-CM | POA: Diagnosis not present

## 2017-02-23 DIAGNOSIS — Z3049 Encounter for surveillance of other contraceptives: Secondary | ICD-10-CM | POA: Diagnosis not present

## 2017-02-23 DIAGNOSIS — Z113 Encounter for screening for infections with a predominantly sexual mode of transmission: Secondary | ICD-10-CM | POA: Diagnosis not present

## 2017-02-23 DIAGNOSIS — Z01419 Encounter for gynecological examination (general) (routine) without abnormal findings: Secondary | ICD-10-CM | POA: Diagnosis not present

## 2017-02-23 DIAGNOSIS — Z6841 Body Mass Index (BMI) 40.0 and over, adult: Secondary | ICD-10-CM | POA: Diagnosis not present

## 2017-02-23 MED FILL — NUVARING VAGINAL RING: 0.12-0.015 | 84 days supply | Qty: 3 | Fill #0

## 2017-02-24 MED FILL — VIT D2 1.25 MG (50,000 UNIT: 1.25 MG | 84 days supply | Qty: 12 | Fill #0

## 2017-03-01 ENCOUNTER — Encounter (INDEPENDENT_AMBULATORY_CARE_PROVIDER_SITE_OTHER): Payer: Self-pay | Admitting: Family Medicine

## 2017-03-01 ENCOUNTER — Ambulatory Visit (INDEPENDENT_AMBULATORY_CARE_PROVIDER_SITE_OTHER): Payer: 59 | Admitting: Family Medicine

## 2017-03-01 VITALS — BP 117/83 | HR 84 | Temp 97.5°F | Resp 18 | Ht 70.0 in | Wt 275.0 lb

## 2017-03-01 DIAGNOSIS — Z6839 Body mass index (BMI) 39.0-39.9, adult: Secondary | ICD-10-CM | POA: Diagnosis not present

## 2017-03-01 DIAGNOSIS — E559 Vitamin D deficiency, unspecified: Secondary | ICD-10-CM

## 2017-03-01 DIAGNOSIS — Z9189 Other specified personal risk factors, not elsewhere classified: Secondary | ICD-10-CM | POA: Diagnosis not present

## 2017-03-01 DIAGNOSIS — E785 Hyperlipidemia, unspecified: Secondary | ICD-10-CM | POA: Diagnosis not present

## 2017-03-01 DIAGNOSIS — Z1389 Encounter for screening for other disorder: Secondary | ICD-10-CM | POA: Diagnosis not present

## 2017-03-01 DIAGNOSIS — E669 Obesity, unspecified: Secondary | ICD-10-CM

## 2017-03-01 DIAGNOSIS — R5383 Other fatigue: Secondary | ICD-10-CM | POA: Diagnosis not present

## 2017-03-01 DIAGNOSIS — Z1331 Encounter for screening for depression: Secondary | ICD-10-CM

## 2017-03-01 DIAGNOSIS — Z0289 Encounter for other administrative examinations: Secondary | ICD-10-CM

## 2017-03-01 DIAGNOSIS — R0602 Shortness of breath: Secondary | ICD-10-CM | POA: Diagnosis not present

## 2017-03-01 NOTE — Progress Notes (Signed)
Office: [phone]  /  Fax: [phone]   HPI:   Chief Complaint: OBESITY  Kathryn Huff (MR# [id number]) is a 26 y.o. female who presents on 03/01/2017 for obesity evaluation and treatment. Current BMI is Body mass index is 39.46 kg/m.Marland Kitchen Kathryn Huff has struggled with obesity for years and has been unsuccessful in either losing weight or maintaining long term weight loss. Kathryn Huff attended our information session and states she is currently in the action stage of change and ready to dedicate time achieving and maintaining a healthier weight.  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 she is a picky eater and doesn't like to eat healthier foods  she has significant food cravings issues  she snacks frequently in the evenings she is frequently drinking liquids with calories she frequently makes poor food choices she has problems with excessive hunger  she frequently eats larger portions than normal  she has binge eating behaviors she struggles with emotional eating    Fatigue Kathryn Huff feels her energy is lower than it should be. This has worsened with weight gain and has not worsened recently. Devina admits to daytime somnolence and  admits to waking up still tired. Patient is at risk for obstructive sleep apnea. Patent has a history of symptoms of daytime fatigue and morning headache. Patient generally gets 8 or 9 hours of sleep per night, and states they generally have restless sleep. Snoring is present. Apneic episodes are not present. Epworth Sleepiness Score is 10  Dyspnea on exertion Kathryn Huff notes increasing shortness of breath with exercising and seems to be worsening over time with weight gain. She notes getting out of breath sooner with activity than she used to. This has not gotten worse recently. Kathryn Huff denies orthopnea.  Vitamin D deficiency Kathryn Huff has a  diagnosis of vitamin D deficiency. She is currently taking vit D and denies nausea, vomiting or muscle weakness.  Hyperlipidemia Kathryn Huff has hyperlipidemia and has been trying to improve her cholesterol levels with intensive lifestyle modification including a low saturated fat diet, exercise and weight loss. She is not currently on Statin. She denies any chest pain, claudication or myalgias.  At risk for diabetes Kathryn Huff is at higher than average risk for developing diabetes due to her obesity. She currently denies polyuria or polydipsia.  Depression Screen Kathryn Huff Food and Mood (modified PHQ-9) score was  Depression screen PHQ 2/9 03/01/2017  Decreased Interest 1  Down, Depressed, Hopeless 2  PHQ - 2 Score 3  Altered sleeping 1  Tired, decreased energy 3  Change in appetite 2  Feeling bad or failure about yourself  3  Trouble concentrating 3  Moving slowly or fidgety/restless 1  Suicidal thoughts 1  PHQ-9 Score 17    ALLERGIES: No Known Allergies  MEDICATIONS: Current Outpatient Prescriptions on File Prior to Visit  Medication Sig Dispense Refill  . Dexmethylphenidate HCl (FOCALIN XR) 30 MG CP24 Take 1 capsule (30 mg total) by mouth daily. Do not fill prior to 03/02/16 30 capsule 0  . etonogestrel-ethinyl estradiol (NUVARING) 0.12-0.015 MG/24HR vaginal ring Place 1 each vaginally every 28 (twenty-eight) days. Insert vaginally and leave in place for 3 consecutive weeks, then remove for 1 week.    . SUMAtriptan (IMITREX) 50 MG tablet One tab at start of migraine. May repeat in 2 hours if headache persists or recurs. 10 tablet 5   No current facility-administered medications on  file prior to visit.     PAST MEDICAL HISTORY: Past Medical History:  Diagnosis Date  . ADD (attention deficit disorder)   . Anxiety   . Back pain   . Chest pain   . Constipation   . Depression   . Fatty liver   . Liver function abnormality    HIGH  . Migraines   . Obesity   . Vitamin D deficiency      PAST SURGICAL HISTORY: History reviewed. No pertinent surgical history.  SOCIAL HISTORY: Social History  Substance Use Topics  . Smoking status: Former Smoker    Years: 4.00  . Smokeless tobacco: Never Used  . Alcohol use Yes     Comment: occasional    FAMILY HISTORY: Family History  Problem Relation Age of Onset  . Diabetes Sister 5    Type 1  . Celiac disease Sister   . Diabetes Mellitus II Maternal Grandmother   . Factor V Leiden deficiency Maternal Grandmother   . Factor V Leiden deficiency Mother   . Obesity Mother   . Hyperlipidemia Father   . Sleep apnea Father   . Obesity Father   . Cancer Paternal Grandmother     breast  . Breast cancer Paternal Grandmother   . Heart Problems Sister     left ventricular non-compaction  . Diabetes Mellitus II Maternal Grandfather   . Cancer Paternal Grandfather     throat/throat and lung, smoker    ROS: Review of Systems  Constitutional: Positive for malaise/fatigue.  HENT:       Dry Mouth  Eyes:       Wear Glasses or Contacts  Respiratory: Positive for shortness of breath (with activity).   Cardiovascular: Positive for chest pain (chest pain/discomfort). Negative for orthopnea and claudication.  Gastrointestinal: Positive for constipation, heartburn and nausea. Negative for vomiting.  Genitourinary: Negative for frequency.  Musculoskeletal: Positive for back pain.       Negative muscle weakness  Skin: Positive for itching.       dryness  Neurological: Positive for headaches.  Endo/Heme/Allergies: Negative for polydipsia. Bruises/bleeds easily (easy bruising).       Heat/Cold Intolerance  Psychiatric/Behavioral: Positive for depression.       Stress    PHYSICAL EXAM: Blood pressure 117/83, pulse 84, temperature 97.5 F (36.4 C), temperature source Oral, resp. rate 18, height 5' 10"  (1.778 m), weight 275 lb (124.7 kg), last menstrual period 02/06/2017, SpO2 97 %. Body mass index is 39.46 kg/m. Physical Exam   Constitutional: She is oriented to person, place, and time. She appears well-developed and well-nourished.  Cardiovascular: Normal rate.   Pulmonary/Chest: Effort normal.  Musculoskeletal: Normal range of motion.  Neurological: She is oriented to person, place, and time.  Skin: Skin is warm and dry.  Psychiatric: She has a normal mood and affect. Her behavior is normal.  Vitals reviewed.   RECENT LABS AND TESTS: BMET    Component Value Date/Time   NA 139 08/26/2015 0751   K 4.4 08/26/2015 0751   CL 105 08/26/2015 0751   CO2 27 08/26/2015 0751   GLUCOSE 101 (H) 08/26/2015 0751   BUN 14 08/26/2015 0751   CREATININE 0.76 08/26/2015 0751   CREATININE 0.79 05/30/2014 0859   CALCIUM 9.1 08/26/2015 0751   GFRNONAA >89 05/30/2014 0859   GFRAA >89 05/30/2014 0859   No results found for: HGBA1C No results found for: INSULIN CBC    Component Value Date/Time   WBC 9.2 08/26/2015 0751  RBC 4.57 08/26/2015 0751   HGB 13.0 08/26/2015 0751   HCT 38.8 08/26/2015 0751   PLT 276.0 08/26/2015 0751   MCV 84.9 08/26/2015 0751   MCH 28.4 05/30/2014 0859   MCHC 33.5 08/26/2015 0751   RDW 13.3 08/26/2015 0751   LYMPHSABS 2.6 08/26/2015 0751   MONOABS 0.7 08/26/2015 0751   EOSABS 0.1 08/26/2015 0751   BASOSABS 0.0 08/26/2015 0751   Iron/TIBC/Ferritin/ %Sat No results found for: IRON, TIBC, FERRITIN, IRONPCTSAT Lipid Panel     Component Value Date/Time   CHOL 146 09/14/2016 0827   TRIG 171.0 (H) 09/14/2016 0827   HDL 54.60 09/14/2016 0827   CHOLHDL 3 09/14/2016 0827   VLDL 34.2 09/14/2016 0827   LDLCALC 57 09/14/2016 0827   Hepatic Function Panel     Component Value Date/Time   PROT 6.9 08/26/2015 0751   ALBUMIN 3.7 08/26/2015 0751   AST 15 08/26/2015 0751   ALT 22 08/26/2015 0751   ALKPHOS 91 08/26/2015 0751   BILITOT 0.4 08/26/2015 0751   BILIDIR 0.1 08/26/2015 0751   IBILI 0.3 05/30/2014 0859      Component Value Date/Time   TSH 3.72 08/26/2015 0751   TSH 2.976  05/30/2014 0859    ECG  shows NSR with a rate of 89 BPM INDIRECT CALORIMETER done today shows a VO2 of 329 and a REE of 2292.    ASSESSMENT AND PLAN: Other fatigue - Plan: EKG 12-Lead, Vitamin B12, CBC With Differential, Comprehensive metabolic panel, Folate, T3, T4, free, TSH  Shortness of breath on exertion  Hyperlipidemia, unspecified hyperlipidemia type - Plan: Lipid Panel With LDL/HDL Ratio  Vitamin D deficiency - Plan: VITAMIN D 25 Hydroxy (Vit-D Deficiency, Fractures)  Depression screening  At risk for diabetes mellitus - Plan: Hemoglobin A1c, Insulin, random  Class 2 obesity without serious comorbidity with body mass index (BMI) of 39.0 to 39.9 in adult, unspecified obesity type  PLAN:  Fatigue Nikitia was informed that her fatigue may be related to obesity, depression or many other causes. Labs will be ordered, and in the meanwhile Blossie has agreed to work on diet, exercise and weight loss to help with fatigue. Proper sleep hygiene was discussed including the need for 7-8 hours of quality sleep each night. A sleep study was not ordered based on symptoms and Epworth score.  Dyspnea on exertion Malayah's shortness of breath appears to be obesity related and exercise induced. She has agreed to work on weight loss and gradually increase exercise to treat her exercise induced shortness of breath. If Shalonda follows our instructions and loses weight without improvement of her shortness of breath, we will plan to refer to pulmonology. We will monitor this condition regularly. Krisinda agrees to this plan.  Vitamin D Deficiency Kathryn Huff was informed that low vitamin D levels contributes to fatigue and are associated with obesity, breast, and colon cancer. She agrees to continue prescription Vit D @50 ,000 IU every week and we will check labs and will follow up for routine testing of vitamin D, at least 2-3 times per year. She was informed of the risk of over-replacement of vitamin D and  agrees to not increase her dose unless he discusses this with [name] first.  Hyperlipidemia Kathryn Huff was informed of the American Heart Association Guidelines emphasizing intensive lifestyle modifications as the first line treatment for hyperlipidemia. We discussed many lifestyle modifications today in depth, and Bonne will continue to work on decreasing saturated fats such as fatty red meat, butter and many fried foods.  She will also increase vegetables and lean protein in her diet and continue to work on exercise and weight loss efforts.  Hyperlipidemia Kathryn Huff was informed of the American Heart Association Guidelines emphasizing intensive lifestyle modifications as the first line treatment for hyperlipidemia. We discussed many lifestyle modifications today in depth, and Kathryn Huff will continue to work on decreasing saturated fats such as fatty red meat, butter and many fried foods. She will also increase vegetables and lean protein in her diet and continue to work on exercise and weight loss efforts.  Diabetes risk counselling Kathryn Huff was given extended (at least 15 minutes) diabetes prevention counseling today. She is 26 y.o. female and has risk factors for diabetes including obesity. We discussed intensive lifestyle modifications today with an emphasis on weight loss as well as increasing exercise and decreasing simple carbohydrates in her diet.  Depression Screen Kathryn Huff had a strongly positive depression screening. Depression is commonly associated with obesity and often results in emotional eating behaviors. We will monitor this closely and work on CBT to help improve the non-hunger eating patterns. Referral to Psychology may be required if no improvement is seen as she continues in our clinic.  Obesity Kathryn Huff is currently in the action stage of change and her goal is to continue with weight loss efforts She has agreed to follow the Category 3 plan Kathryn Huff has been instructed to work up to a goal of 150  minutes of combined cardio and strengthening exercise per week for weight loss and overall health benefits. We discussed the following Behavioral Modification Stratagies today: increasing lean protein intake, increasing vegetables, increasing lower sugar fruits, work on meal planning and easy cooking plans, emotional eating strategies, avoiding temptations and decrease liquid calories  Kathryn Huff has agreed to follow up with our clinic in 2 weeks. She was informed of the importance of frequent follow up visits to maximize her success with intensive lifestyle modifications for her multiple health conditions. She was informed we would discuss her lab results at her next visit unless there is a critical issue that needs to be addressed sooner. Kathryn Huff agreed to keep her next visit at the agreed upon time to discuss these results.  I, Doreene Nest, am acting as scribe for Dennard Nip, MD  I have reviewed the above documentation for accuracy and completeness, and I agree with the above. -Dennard Nip, MD

## 2017-03-02 LAB — VITAMIN D 25 HYDROXY (VIT D DEFICIENCY, FRACTURES): VIT D 25 HYDROXY: 16.2 ng/mL — AB (ref 30.0–100.0)

## 2017-03-02 LAB — T4, FREE: Free T4: 1.07 ng/dL (ref 0.82–1.77)

## 2017-03-02 LAB — COMPREHENSIVE METABOLIC PANEL
A/G RATIO: 1.4 (ref 1.2–2.2)
ALT: 45 IU/L — AB (ref 0–32)
AST: 30 IU/L (ref 0–40)
Albumin: 3.9 g/dL (ref 3.5–5.5)
Alkaline Phosphatase: 94 IU/L (ref 39–117)
BUN / CREAT RATIO: 15 (ref 9–23)
BUN: 13 mg/dL (ref 6–20)
Bilirubin Total: 0.4 mg/dL (ref 0.0–1.2)
CALCIUM: 9.1 mg/dL (ref 8.7–10.2)
CHLORIDE: 102 mmol/L (ref 96–106)
CO2: 21 mmol/L (ref 18–29)
Creatinine, Ser: 0.86 mg/dL (ref 0.57–1.00)
GFR, EST AFRICAN AMERICAN: 109 mL/min/{1.73_m2} (ref >59)
GFR, EST NON AFRICAN AMERICAN: 94 mL/min/{1.73_m2} (ref >59)
Globulin, Total: 2.7 g/dL (ref 1.5–4.5)
Glucose: 97 mg/dL (ref 65–99)
POTASSIUM: 4.6 mmol/L (ref 3.5–5.2)
Sodium: 137 mmol/L (ref 134–144)
TOTAL PROTEIN: 6.6 g/dL (ref 6.0–8.5)

## 2017-03-02 LAB — CBC WITH DIFFERENTIAL
BASOS: 0 %
Basophils Absolute: 0 10*3/uL (ref 0.0–0.2)
EOS (ABSOLUTE): 0.1 10*3/uL (ref 0.0–0.4)
Eos: 1 %
Hematocrit: 38.6 % (ref 34.0–46.6)
Hemoglobin: 13.2 g/dL (ref 11.1–15.9)
IMMATURE GRANS (ABS): 0 10*3/uL (ref 0.0–0.1)
Immature Granulocytes: 0 %
LYMPHS: 21 %
Lymphocytes Absolute: 2.3 10*3/uL (ref 0.7–3.1)
MCH: 28.4 pg (ref 26.6–33.0)
MCHC: 34.2 g/dL (ref 31.5–35.7)
MCV: 83 fL (ref 79–97)
Monocytes Absolute: 0.7 10*3/uL (ref 0.1–0.9)
Monocytes: 6 %
NEUTROS PCT: 72 %
Neutrophils Absolute: 7.7 10*3/uL — ABNORMAL HIGH (ref 1.4–7.0)
RBC: 4.64 x10E6/uL (ref 3.77–5.28)
RDW: 13.8 % (ref 12.3–15.4)
WBC: 10.6 10*3/uL (ref 3.4–10.8)

## 2017-03-02 LAB — HEMOGLOBIN A1C
ESTIMATED AVERAGE GLUCOSE: 108 mg/dL
Hgb A1c MFr Bld: 5.4 % (ref 4.8–5.6)

## 2017-03-02 LAB — LIPID PANEL WITH LDL/HDL RATIO
Cholesterol, Total: 182 mg/dL (ref 100–199)
HDL: 46 mg/dL (ref >39)
LDL Calculated: 102 mg/dL — ABNORMAL HIGH (ref 0–99)
LDL/HDL RATIO: 2.2 ratio (ref 0.0–3.2)
Triglycerides: 170 mg/dL — ABNORMAL HIGH (ref 0–149)
VLDL Cholesterol Cal: 34 mg/dL (ref 5–40)

## 2017-03-02 LAB — FOLATE: Folate: 11.5 ng/mL (ref >3.0)

## 2017-03-02 LAB — VITAMIN B12: Vitamin B-12: 523 pg/mL (ref 232–1245)

## 2017-03-02 LAB — TSH: TSH: 3.2 u[IU]/mL (ref 0.450–4.500)

## 2017-03-02 LAB — T3: T3 TOTAL: 159 ng/dL (ref 71–180)

## 2017-03-02 LAB — INSULIN, RANDOM: INSULIN: 32.5 u[IU]/mL — ABNORMAL HIGH (ref 2.6–24.9)

## 2017-03-09 ENCOUNTER — Encounter (INDEPENDENT_AMBULATORY_CARE_PROVIDER_SITE_OTHER): Payer: Self-pay | Admitting: Family Medicine

## 2017-03-14 ENCOUNTER — Encounter: Payer: Self-pay | Admitting: Family

## 2017-03-14 ENCOUNTER — Ambulatory Visit (INDEPENDENT_AMBULATORY_CARE_PROVIDER_SITE_OTHER): Payer: 59 | Admitting: Family

## 2017-03-14 ENCOUNTER — Ambulatory Visit (INDEPENDENT_AMBULATORY_CARE_PROVIDER_SITE_OTHER): Payer: 59 | Admitting: Family Medicine

## 2017-03-14 VITALS — BP 109/74 | HR 87 | Temp 97.5°F | Ht 70.0 in | Wt 273.0 lb

## 2017-03-14 DIAGNOSIS — E669 Obesity, unspecified: Secondary | ICD-10-CM | POA: Diagnosis not present

## 2017-03-14 DIAGNOSIS — F909 Attention-deficit hyperactivity disorder, unspecified type: Secondary | ICD-10-CM

## 2017-03-14 DIAGNOSIS — E66812 Obesity, class 2: Secondary | ICD-10-CM

## 2017-03-14 DIAGNOSIS — Z6839 Body mass index (BMI) 39.0-39.9, adult: Secondary | ICD-10-CM

## 2017-03-14 DIAGNOSIS — Z9189 Other specified personal risk factors, not elsewhere classified: Secondary | ICD-10-CM

## 2017-03-14 DIAGNOSIS — E784 Other hyperlipidemia: Secondary | ICD-10-CM

## 2017-03-14 DIAGNOSIS — G43909 Migraine, unspecified, not intractable, without status migrainosus: Secondary | ICD-10-CM

## 2017-03-14 DIAGNOSIS — E559 Vitamin D deficiency, unspecified: Secondary | ICD-10-CM

## 2017-03-14 DIAGNOSIS — E8881 Metabolic syndrome: Secondary | ICD-10-CM | POA: Diagnosis not present

## 2017-03-14 DIAGNOSIS — E781 Pure hyperglyceridemia: Secondary | ICD-10-CM

## 2017-03-14 DIAGNOSIS — E88819 Insulin resistance, unspecified: Secondary | ICD-10-CM

## 2017-03-14 DIAGNOSIS — E7849 Other hyperlipidemia: Secondary | ICD-10-CM

## 2017-03-14 DIAGNOSIS — IMO0001 Reserved for inherently not codable concepts without codable children: Secondary | ICD-10-CM | POA: Insufficient documentation

## 2017-03-14 MED ORDER — METFORMIN HCL 500 MG PO TABS: 500.0000 mg | ORAL_TABLET | Freq: Every day | ORAL | 0 refills | Status: DC

## 2017-03-14 MED FILL — metFORMIN HCL 500 MG TABS: 500 | 30 days supply | Qty: 30 | Fill #0

## 2017-03-14 NOTE — Progress Notes (Signed)
Subjective:    Patient ID: Kathryn Huff, female    DOB: 06/15/1991, 26 y.o.   MRN: [id number]  HPI   Kathryn Huff is a 26 yr old female who presents today for follow up.  1) Migraine- stable.  Has not had any recent migraines.   2) ADHD- was using focalin prn. Reports that now that her wedding is over and she is finished with school her stress is much better and she is doing better. Wants to remain off of focalin.   3) Hypertriglyceridemia-  Lab Results  Component Value Date   CHOL 182 03/01/2017   HDL 46 03/01/2017   LDLCALC 102 (H) 03/01/2017   TRIG 170 (H) 03/01/2017   CHOLHDL 3 09/14/2016   4) Morbid obesity- she is working with Dr. Leafy Ro ([facility]). Last saw her today. Reports that she is interested in working on improving her health.   Wt Readings from Last 3 Encounters:  03/14/17 277 lb (125.6 kg)  03/14/17 273 lb (123.8 kg)  03/01/17 275 lb (124.7 kg)  Was started on glucophage for insulin resistance by Dr Leafy Ro.   Review of Systems Past Medical History:  Diagnosis Date  . ADD (attention deficit disorder)   . Anxiety   . Back pain   . Chest pain   . Constipation   . Depression   . Fatty liver   . Liver function abnormality    HIGH  . Migraines   . Obesity   . Vitamin D deficiency      Social History   Social History  . Marital status: Married    Spouse name: Erlene Quan  . Number of children: N/A  . Years of education: N/A   Occupational History  . XRAY tech & Admission Associate [employer] History Main Topics  . Smoking status: Former Smoker    Years: 4.00  . Smokeless tobacco: Never Used  . Alcohol use Yes     Comment: occasional  . Drug use: No  . Sexual activity: Yes    Partners: Male    Birth control/ protection: Other-see comments     Comment: nuvaring   Other Topics Concern  . 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  Family History  Problem Relation Age of Onset  . Diabetes Sister 5    Type 1  . Celiac disease Sister   . Diabetes Mellitus II Maternal Grandmother   . Factor V Leiden deficiency Maternal Grandmother   . Factor V Leiden deficiency Mother   . Obesity Mother   . Hyperlipidemia Father   . Sleep apnea Father   . Obesity Father   . Cancer Paternal Grandmother     breast  . Breast cancer Paternal Grandmother   . Heart Problems Sister     left ventricular non-compaction  . Diabetes Mellitus II Maternal Grandfather   . Cancer Paternal Grandfather     throat/throat and lung, smoker    No Known Allergies  Current Outpatient Prescriptions on File Prior to Visit  Medication Sig Dispense Refill  . ergocalciferol (VITAMIN D2) 50000 units capsule Take 50,000 Units by mouth once  a week.    . etonogestrel-ethinyl estradiol (NUVARING) 0.12-0.015 MG/24HR vaginal ring Place 1 each vaginally every 28 (twenty-eight) days. Insert vaginally and leave in place for 3 consecutive weeks, then remove for 1 week.    . SUMAtriptan (IMITREX) 50 MG tablet One tab at start of migraine. May repeat in 2 hours if headache persists or recurs. 10 tablet 5   No current facility-administered medications on file prior to visit.     BP 113/75 (BP Location: Right Arm, Patient Position: Sitting, Cuff Size: Large)   Pulse 75   Temp 98.6 F (37 C) (Oral)   Resp 16   Ht 6' (1.829 m)   Wt 277 lb (125.6 kg)   LMP 02/06/2017   SpO2 99%   BMI 37.57 kg/m       Objective:   Physical Exam  Constitutional: She is oriented to person, place, and time. She appears well-developed and well-nourished.  HENT:  Head: Normocephalic and atraumatic.  Neck: Neck supple. No thyromegaly present.  Cardiovascular: Normal rate, regular rhythm and normal heart sounds.   No  murmur heard. Pulmonary/Chest: Effort normal and breath sounds normal. No respiratory distress. She has no wheezes.  Musculoskeletal: She exhibits no edema.  Neurological: She is alert and oriented to person, place, and time.  Psychiatric: She has a normal mood and affect. Her behavior is normal. Judgment and thought content normal.          Assessment & Plan:

## 2017-03-14 NOTE — Assessment & Plan Note (Signed)
Working closely with Dr. Leafy Ro.

## 2017-03-14 NOTE — Assessment & Plan Note (Signed)
Stable, monitor.  °

## 2017-03-14 NOTE — Progress Notes (Signed)
Office: [phone]  /  Fax: [phone]   HPI:   Chief Complaint: OBESITY Kathryn Huff is here to discuss her progress with her obesity treatment plan. She is following her eating plan approximately 90 % of the time and states she is exercising 30 minutes 7 times per week. [name] did well with weight loss on category 3 plan. She didn't like dinner but did well with breakfast. Hunger was controlled, cravings were controlled. Her weight is 273 lb (123.8 kg) today and has had a weight loss of 2 pounds over a period of 2 weeks since her last visit. She has lost 2 lbs since starting treatment with [name].  Vitamin D deficiency [name] has a diagnosis of vitamin D deficiency. She just started vitamin D prescribed by her GYN but hasn't taken it yet. Her level is very low and she admits fatigue and denies nausea, vomiting or muscle weakness.  Hyperlipidemia [name] has hyperlipidemia, LDL has doubled in the last 6 months and triglycerides are >150. She has a strong family history of hyperlipidemia and coronary artery disease. She has been trying to improve her cholesterol levels with intensive lifestyle modification including a low saturated fat diet, exercise and weight loss. She denies any chest pain, claudication or myalgias.  Insulin Resistance [name] has a diagnosis of insulin resistance based on her significantly elevated fasting insulin level at 32.  Although [name]'s blood glucose readings and A1c are within normal limits, insulin resistance puts her at greater risk of metabolic syndrome and diabetes. She is not taking metformin currently and continues to work on diet and exercise to decrease risk of diabetes. She admits polyphagia.  At risk for diabetes [name] is at higher than average risk for developing diabetes due to her obesity. She currently denies polyuria or polydipsia.  Wt Readings from Last 500 Encounters:  03/14/17 273 lb (123.8 kg)  03/01/17 275 lb (124.7 kg)  09/14/16 267 lb 12.8 oz  (121.5 kg)  06/09/16 263 lb 12.8 oz (119.7 kg)  02/23/16 258 lb 2 oz (117.1 kg)  08/26/15 253 lb (114.8 kg)  08/07/15 258 lb 12.8 oz (117.4 kg)  06/08/15 259 lb 12.8 oz (117.8 kg)  03/06/15 249 lb 9.6 oz (113.2 kg)  09/10/14 240 lb (108.9 kg)  05/30/14 240 lb 0.6 oz (108.9 kg)  07/09/12 210 lb (95.3 kg)     ALLERGIES: No Known Allergies  MEDICATIONS: Current Outpatient Prescriptions on File Prior to Visit  Medication Sig Dispense Refill  . Dexmethylphenidate HCl (FOCALIN XR) 30 MG CP24 Take 1 capsule (30 mg total) by mouth daily. Do not fill prior to 25/15/17 30 capsule 0  . ergocalciferol (VITAMIN D2) 50000 units capsule Take 50,000 Units by mouth once a week.    . etonogestrel-ethinyl estradiol (NUVARING) 0.12-0.015 MG/24HR vaginal ring Place 1 each vaginally every 28 (twenty-eight) days. Insert vaginally and leave in place for 3 consecutive weeks, then remove for 1 week.    . SUMAtriptan (IMITREX) 50 MG tablet One tab at start of migraine. May repeat in 2 hours if headache persists or recurs. 10 tablet 5   No current facility-administered medications on file prior to visit.     PAST MEDICAL HISTORY: Past Medical History:  Diagnosis Date  . ADD (attention deficit disorder)   . Anxiety   . Back pain   . Chest pain   . Constipation   . Depression   . Fatty liver   . Liver function abnormality    HIGH  . Migraines   .  Obesity   . Vitamin D deficiency     PAST SURGICAL HISTORY: No past surgical history on file.  SOCIAL HISTORY: Social History  Substance Use Topics  . Smoking status: Former Smoker    Years: 4.00  . Smokeless tobacco: Never Used  . Alcohol use Yes     Comment: occasional    FAMILY HISTORY: Family History  Problem Relation Age of Onset  . Diabetes Sister 5    Type 1  . Celiac disease Sister   . Diabetes Mellitus II Maternal Grandmother   . Factor V Leiden deficiency Maternal Grandmother   . Factor V Leiden deficiency Mother   . Obesity  Mother   . Hyperlipidemia Father   . Sleep apnea Father   . Obesity Father   . Cancer Paternal Grandmother     breast  . Breast cancer Paternal Grandmother   . Heart Problems Sister     left ventricular non-compaction  . Diabetes Mellitus II Maternal Grandfather   . Cancer Paternal Grandfather     throat/throat and lung, smoker    ROS: Review of Systems  Constitutional: Positive for malaise/fatigue and weight loss.  Cardiovascular: Negative for chest pain and claudication.  Gastrointestinal: Negative for nausea and vomiting.  Genitourinary: Negative for frequency.  Musculoskeletal: Negative for myalgias.       Negative muscle weakness   Endo/Heme/Allergies: Negative for polydipsia.       Polyphagia    PHYSICAL EXAM: Blood pressure 109/74, pulse 87, temperature 97.5 F (36.4 C), temperature source Oral, height 5' 10"  (1.778 m), weight 273 lb (123.8 kg), last menstrual period 02/06/2017, SpO2 94 %. Body mass index is 39.17 kg/m. Physical Exam  Constitutional: She appears well-developed and well-nourished.  Cardiovascular: Normal rate.   Pulmonary/Chest: Effort normal.  Musculoskeletal: Normal range of motion.  Skin: Skin is warm and dry.  Psychiatric: She has a normal mood and affect. Her behavior is normal.  Vitals reviewed.   RECENT LABS AND TESTS: BMET    Component Value Date/Time   NA 137 03/01/2017 1041   K 4.6 03/01/2017 1041   CL 102 03/01/2017 1041   CO2 21 03/01/2017 1041   GLUCOSE 97 03/01/2017 1041   GLUCOSE 101 (H) 08/26/2015 0751   BUN 13 03/01/2017 1041   CREATININE 0.86 03/01/2017 1041   CREATININE 0.79 05/30/2014 0859   CALCIUM 9.1 03/01/2017 1041   GFRNONAA 94 03/01/2017 1041   GFRNONAA >89 05/30/2014 0859   GFRAA 109 03/01/2017 1041   GFRAA >89 05/30/2014 0859   Lab Results  Component Value Date   HGBA1C 5.4 03/01/2017   Lab Results  Component Value Date   INSULIN 32.5 (H) 03/01/2017   CBC    Component Value Date/Time   WBC 10.6  03/01/2017 1041   WBC 9.2 08/26/2015 0751   RBC 4.64 03/01/2017 1041   RBC 4.57 08/26/2015 0751   HGB 13.0 08/26/2015 0751   HCT 38.6 03/01/2017 1041   PLT 276.0 08/26/2015 0751   MCV 83 03/01/2017 1041   MCH 28.4 03/01/2017 1041   MCH 28.4 05/30/2014 0859   MCHC 34.2 03/01/2017 1041   MCHC 33.5 08/26/2015 0751   RDW 13.8 03/01/2017 1041   LYMPHSABS 2.3 03/01/2017 1041   MONOABS 0.7 08/26/2015 0751   EOSABS 0.1 03/01/2017 1041   BASOSABS 0.0 03/01/2017 1041   Iron/TIBC/Ferritin/ %Sat No results found for: IRON, TIBC, FERRITIN, IRONPCTSAT Lipid Panel     Component Value Date/Time   CHOL 182 03/01/2017 1041   TRIG  170 (H) 03/01/2017 1041   HDL 46 03/01/2017 1041   CHOLHDL 3 09/14/2016 0827   VLDL 34.2 09/14/2016 0827   LDLCALC 102 (H) 03/01/2017 1041   Hepatic Function Panel     Component Value Date/Time   PROT 6.6 03/01/2017 1041   ALBUMIN 3.9 03/01/2017 1041   AST 30 03/01/2017 1041   ALT 45 (H) 03/01/2017 1041   ALKPHOS 94 03/01/2017 1041   BILITOT 0.4 03/01/2017 1041   BILIDIR 0.1 08/26/2015 0751   IBILI 0.3 05/30/2014 0859      Component Value Date/Time   TSH 3.200 03/01/2017 1041   TSH 3.72 08/26/2015 0751   TSH 2.976 05/30/2014 0859    ASSESSMENT AND PLAN: Insulin resistance - Plan: metFORMIN (GLUCOPHAGE) 500 MG tablet  Vitamin D deficiency  Other hyperlipidemia  At risk for diabetes mellitus - Plan: metFORMIN (GLUCOPHAGE) 500 MG tablet  Class 2 obesity without serious comorbidity with body mass index (BMI) of 39.0 to 39.9 in adult, unspecified obesity type  PLAN:  Vitamin D Deficiency Punam was informed that low vitamin D levels contributes to fatigue and are associated with obesity, breast, and colon cancer. She was encouraged to start prescription Vit D @50 ,000 IU every week, no prescription needed today and we will re-check labs in 3 months and will follow up for routine testing of vitamin D, at least 2-3 times per year. She was informed of the  risk of over-replacement of vitamin D and agrees to not increase her dose unless he discusses this with [name] first and will follow up with our clinic in 2 weeks.  Hyperlipidemia Hilarie was informed of the American Heart Association Guidelines emphasizing intensive lifestyle modifications as the first line treatment for hyperlipidemia. We discussed many lifestyle modifications today in depth, and Raye will continue to work on decreasing saturated fats such as fatty red meat, butter and many fried foods. She will also increase vegetables and lean protein in her diet and continue to work on exercise and weight loss efforts. We will re-check labs in 3 months and follow.  Insulin Resistance Tori will continue to work on weight loss, exercise, and decreasing simple carbohydrates in her diet to help decrease the risk of diabetes. We dicussed metformin including benefits and risks. She was informed that eating too many simple carbohydrates or too many calories at one sitting increases the likelihood of GI side effects. Adjoa agrees to start metformin for now and prescription was written today for Metformin 500 mg every morning #30 with no refills. Cerise agreed to follow up with [name] as directed to monitor her progress.  Diabetes risk counselling Wesleigh was given extended (at least 30 minutes) diabetes prevention counseling today. She is 26 y.o. female and has risk factors for diabetes including obesity. We discussed intensive lifestyle modifications today with an emphasis on weight loss as well as increasing exercise and decreasing simple carbohydrates in her diet.  Obesity Lillionna is currently in the action stage of change. As such, her goal is to continue with weight loss efforts She has agreed to keep a food journal with 450 to 600 calories and 35+ grams of protein daily and follow the Category 3 plan Quina has been instructed to work up to a goal of 150 minutes of combined cardio and strengthening  exercise per week for weight loss and overall health benefits. We discussed the following Behavioral Modification Stratagies today: increasing lean protein intake, decreasing simple carbohydrates , increasing vegetables, increasing lower sugar fruits, increasing fiber rich foods,  decreasing sodium intake and dealing with family or coworker sabotage  Yobana has agreed to follow up with our clinic in 2 weeks. She was informed of the importance of frequent follow up visits to maximize her success with intensive lifestyle modifications for her multiple health conditions.  I, Doreene Nest, am acting as scribe for Dennard Nip, MD  I have reviewed the above documentation for accuracy and completeness, and I agree with the above. -Dennard Nip, MD

## 2017-03-14 NOTE — Assessment & Plan Note (Signed)
Improving, she is working on diet/exercise and weight loss.

## 2017-03-14 NOTE — Progress Notes (Signed)
Pre visit review using our clinic review tool, if applicable. No additional management support is needed unless otherwise documented below in the visit note. 

## 2017-03-14 NOTE — Assessment & Plan Note (Signed)
Stable off of meds.  Monitor.

## 2017-03-28 ENCOUNTER — Ambulatory Visit (INDEPENDENT_AMBULATORY_CARE_PROVIDER_SITE_OTHER): Payer: 59 | Admitting: Family Medicine

## 2017-03-28 VITALS — BP 126/85 | HR 90 | Temp 97.8°F | Resp 16 | Ht 70.0 in | Wt 268.0 lb

## 2017-03-28 DIAGNOSIS — R7303 Prediabetes: Secondary | ICD-10-CM | POA: Diagnosis not present

## 2017-03-28 DIAGNOSIS — E669 Obesity, unspecified: Secondary | ICD-10-CM

## 2017-03-28 DIAGNOSIS — Z6838 Body mass index (BMI) 38.0-38.9, adult: Secondary | ICD-10-CM

## 2017-03-28 NOTE — Progress Notes (Signed)
Office: [phone]  /  Fax: [phone]   HPI:   Chief Complaint: OBESITY Kathryn Huff is here to discuss her progress with her obesity treatment plan. She is following her eating plan approximately 75 % of the time and states she is exercising 0 minutes 0 times per week. Kathryn Huff continues to do well with weight loss but getting bored with lunch. She was given options to journal for dinner and did it a few times. She is counting steps and averaging 7 to 8,000 steps per day. Her weight is 268 lb (121.6 kg) today and has had a weight loss of 5 pounds over a period of 2 weeks since her last visit. She has lost 7 lbs since starting treatment with [name].  Pre-Diabetes Kathryn Huff has a diagnosis of prediabetes based on her elevated Hgb A1c and was informed this puts her at greater risk of developing diabetes. She started taking metformin and notes decreased polyphagia. She had mild GI upset with eating poor food choices. She continues to work on diet and exercise to decrease risk of diabetes. She denies any hypoglycemic episodes.   Wt Readings from Last 500 Encounters:  03/28/17 268 lb (121.6 kg)  03/14/17 277 lb (125.6 kg)  03/14/17 273 lb (123.8 kg)  03/01/17 275 lb (124.7 kg)  09/14/16 267 lb 12.8 oz (121.5 kg)  06/09/16 263 lb 12.8 oz (119.7 kg)  02/23/16 258 lb 2 oz (117.1 kg)  08/26/15 253 lb (114.8 kg)  08/07/15 258 lb 12.8 oz (117.4 kg)  06/08/15 259 lb 12.8 oz (117.8 kg)  03/06/15 249 lb 9.6 oz (113.2 kg)  09/10/14 240 lb (108.9 kg)  05/30/14 240 lb 0.6 oz (108.9 kg)  07/09/12 210 lb (95.3 kg)     ALLERGIES: No Known Allergies  MEDICATIONS: Current Outpatient Prescriptions on File Prior to Visit  Medication Sig Dispense Refill  . ergocalciferol (VITAMIN D2) 50000 units capsule Take 50,000 Units by mouth once a week.    . etonogestrel-ethinyl estradiol (NUVARING) 0.12-0.015 MG/24HR vaginal ring Place 1 each vaginally every 28 (twenty-eight) days. Insert vaginally and leave in place  for 3 consecutive weeks, then remove for 1 week.    . metFORMIN (GLUCOPHAGE) 500 MG tablet Take 1 tablet (500 mg total) by mouth daily with breakfast. 30 tablet 0  . SUMAtriptan (IMITREX) 50 MG tablet One tab at start of migraine. May repeat in 2 hours if headache persists or recurs. 10 tablet 5   No current facility-administered medications on file prior to visit.     PAST MEDICAL HISTORY: Past Medical History:  Diagnosis Date  . ADD (attention deficit disorder)   . Anxiety   . Back pain   . Chest pain   . Constipation   . Depression   . Fatty liver   . Liver function abnormality    HIGH  . Migraines   . Obesity   . Vitamin D deficiency     PAST SURGICAL HISTORY: No past surgical history on file.  SOCIAL HISTORY: Social History  Substance Use Topics  . Smoking status: Former Smoker    Years: 4.00  . Smokeless tobacco: Never Used  . Alcohol use Yes     Comment: occasional    FAMILY HISTORY: Family History  Problem Relation Age of Onset  . Diabetes Sister 5    Type 1  . Celiac disease Sister   . Diabetes Mellitus II Maternal Grandmother   . Factor V Leiden deficiency Maternal Grandmother   . Factor V Leiden deficiency  Mother   . Obesity Mother   . Hyperlipidemia Father   . Sleep apnea Father   . Obesity Father   . Cancer Paternal Grandmother     breast  . Breast cancer Paternal Grandmother   . Heart Problems Sister     left ventricular non-compaction  . Diabetes Mellitus II Maternal Grandfather   . Cancer Paternal Grandfather     throat/throat and lung, smoker    ROS: Review of Systems  Constitutional: Positive for weight loss.  Gastrointestinal: Positive for diarrhea (mild) and nausea (mild).  Endo/Heme/Allergies:       Polyphagia Negative hypoglycemia    PHYSICAL EXAM: Blood pressure 126/85, pulse 90, temperature 97.8 F (36.6 C), temperature source Oral, resp. rate 16, height 5' 10"  (1.778 m), weight 268 lb (121.6 kg), SpO2 97 %. Body mass  index is 38.45 kg/m. Physical Exam  Constitutional: She is oriented to person, place, and time. She appears well-developed and well-nourished.  Cardiovascular: Normal rate.   Pulmonary/Chest: Effort normal.  Musculoskeletal: Normal range of motion.  Neurological: She is oriented to person, place, and time.  Skin: Skin is warm and dry.  Psychiatric: She has a normal mood and affect. Her behavior is normal.  Vitals reviewed.   RECENT LABS AND TESTS: BMET    Component Value Date/Time   NA 137 03/01/2017 1041   K 4.6 03/01/2017 1041   CL 102 03/01/2017 1041   CO2 21 03/01/2017 1041   GLUCOSE 97 03/01/2017 1041   GLUCOSE 101 (H) 08/26/2015 0751   BUN 13 03/01/2017 1041   CREATININE 0.86 03/01/2017 1041   CREATININE 0.79 05/30/2014 0859   CALCIUM 9.1 03/01/2017 1041   GFRNONAA 94 03/01/2017 1041   GFRNONAA >89 05/30/2014 0859   GFRAA 109 03/01/2017 1041   GFRAA >89 05/30/2014 0859   Lab Results  Component Value Date   HGBA1C 5.4 03/01/2017   Lab Results  Component Value Date   INSULIN 32.5 (H) 03/01/2017   CBC    Component Value Date/Time   WBC 10.6 03/01/2017 1041   WBC 9.2 08/26/2015 0751   RBC 4.64 03/01/2017 1041   RBC 4.57 08/26/2015 0751   HGB 13.0 08/26/2015 0751   HCT 38.6 03/01/2017 1041   PLT 276.0 08/26/2015 0751   MCV 83 03/01/2017 1041   MCH 28.4 03/01/2017 1041   MCH 28.4 05/30/2014 0859   MCHC 34.2 03/01/2017 1041   MCHC 33.5 08/26/2015 0751   RDW 13.8 03/01/2017 1041   LYMPHSABS 2.3 03/01/2017 1041   MONOABS 0.7 08/26/2015 0751   EOSABS 0.1 03/01/2017 1041   BASOSABS 0.0 03/01/2017 1041   Iron/TIBC/Ferritin/ %Sat No results found for: IRON, TIBC, FERRITIN, IRONPCTSAT Lipid Panel     Component Value Date/Time   CHOL 182 03/01/2017 1041   TRIG 170 (H) 03/01/2017 1041   HDL 46 03/01/2017 1041   CHOLHDL 3 09/14/2016 0827   VLDL 34.2 09/14/2016 0827   LDLCALC 102 (H) 03/01/2017 1041   Hepatic Function Panel     Component Value Date/Time     PROT 6.6 03/01/2017 1041   ALBUMIN 3.9 03/01/2017 1041   AST 30 03/01/2017 1041   ALT 45 (H) 03/01/2017 1041   ALKPHOS 94 03/01/2017 1041   BILITOT 0.4 03/01/2017 1041   BILIDIR 0.1 08/26/2015 0751   IBILI 0.3 05/30/2014 0859      Component Value Date/Time   TSH 3.200 03/01/2017 1041   TSH 3.72 08/26/2015 0751   TSH 2.976 05/30/2014 0859    ASSESSMENT  AND PLAN: Prediabetes  Class 2 obesity without serious comorbidity with body mass index (BMI) of 38.0 to 38.9 in adult, unspecified obesity type  PLAN:  Pre-Diabetes Kathryn Huff will continue to work on weight loss, exercise, and decreasing simple carbohydrates in her diet to help decrease the risk of diabetes. We dicussed metformin including benefits and risks. She was informed that eating too many simple carbohydrates or too many calories at one sitting increases the likelihood of GI side effects. Kathryn Huff agrees to continue taking Metformin as directed and we will re-check labs in 2 months. Kathryn Huff agreed to follow up with [name] as directed to monitor her progress.  We spent > than 50% of the 15 minute visit on the counseling as documented in the note.  Obesity Kathryn Huff is currently in the action stage of change. As such, her goal is to continue with weight loss efforts She has agreed to keep a food journal with 400 to 600 calories and 35 grams of protein daily and follow the Category 2 plan Kathryn Huff has been instructed to work up to a goal of 150 minutes of combined cardio and strengthening exercise per week for weight loss and overall health benefits. We discussed the following Behavioral Modification Stratagies today: decreasing simple carbohydrates , increasing vegetables and fruit, increasing lower sugar fruits and dealing with family or coworker sabotage  Kathryn Huff has agreed to follow up with our clinic in 2 weeks. She was informed of the importance of frequent follow up visits to maximize her success with intensive lifestyle modifications  for her multiple health conditions.  I, Doreene Nest, am acting as scribe for Dennard Nip, MD  I have reviewed the above documentation for accuracy and completeness, and I agree with the above. -Dennard Nip, MD

## 2017-04-06 DIAGNOSIS — R740 Nonspecific elevation of levels of transaminase and lactic acid dehydrogenase [LDH]: Secondary | ICD-10-CM | POA: Diagnosis not present

## 2017-04-11 ENCOUNTER — Ambulatory Visit (INDEPENDENT_AMBULATORY_CARE_PROVIDER_SITE_OTHER): Payer: 59 | Admitting: Family Medicine

## 2017-04-11 VITALS — BP 118/86 | HR 88 | Temp 98.4°F | Ht 70.0 in | Wt 264.0 lb

## 2017-04-11 DIAGNOSIS — E669 Obesity, unspecified: Secondary | ICD-10-CM | POA: Diagnosis not present

## 2017-04-11 DIAGNOSIS — R7303 Prediabetes: Secondary | ICD-10-CM

## 2017-04-11 DIAGNOSIS — Z6837 Body mass index (BMI) 37.0-37.9, adult: Secondary | ICD-10-CM | POA: Diagnosis not present

## 2017-04-11 DIAGNOSIS — Z9189 Other specified personal risk factors, not elsewhere classified: Secondary | ICD-10-CM

## 2017-04-11 MED ORDER — METFORMIN HCL 500 MG PO TABS: 500.0000 mg | ORAL_TABLET | Freq: Every day | ORAL | 0 refills | Status: DC

## 2017-04-11 MED FILL — metFORMIN HCL 500 MG TABS: 500 | 30 days supply | Qty: 30 | Fill #0

## 2017-04-11 NOTE — Progress Notes (Signed)
Office: [phone]  /  Fax: [phone]   HPI:   Chief Complaint: OBESITY Kathryn Huff is here to discuss her progress with her obesity treatment plan. She is following her eating plan approximately 90 % of the time and states she is exercising 0 minutes 0 times per week. [name] continues to do well with weight loss. She is following the plan well but not exercising yet. Her weight is 264 lb (119.7 kg) today and has had a weight loss of 4 pounds over a period of 2 weeks since her last visit. She has lost 11 lbs since starting treatment with [name].  Pre-Diabetes Kathryn Huff has a diagnosis of prediabetes based on her elevated Hgb A1c and was informed this puts her at greater risk of developing diabetes. She is stable on metformin currently and continues to work on diet and exercise to decrease risk of diabetes. She is doing well on diet. She denies nausea, vomiting or hypoglycemia.  At risk for diabetes Kathryn Huff is at higher than average risk for developing diabetes due to her obesity and pre-diabetes. She currently denies polyuria or polydipsia.   Wt Readings from Last 500 Encounters:  04/11/17 264 lb (119.7 kg)  03/28/17 268 lb (121.6 kg)  03/14/17 277 lb (125.6 kg)  03/14/17 273 lb (123.8 kg)  03/01/17 275 lb (124.7 kg)  09/14/16 267 lb 12.8 oz (121.5 kg)  06/09/16 263 lb 12.8 oz (119.7 kg)  02/23/16 258 lb 2 oz (117.1 kg)  08/26/15 253 lb (114.8 kg)  08/07/15 258 lb 12.8 oz (117.4 kg)  06/08/15 259 lb 12.8 oz (117.8 kg)  03/06/15 249 lb 9.6 oz (113.2 kg)  09/10/14 240 lb (108.9 kg)  05/30/14 240 lb 0.6 oz (108.9 kg)  07/09/12 210 lb (95.3 kg)     ALLERGIES: No Known Allergies  MEDICATIONS: Current Outpatient Prescriptions on File Prior to Visit  Medication Sig Dispense Refill  . ergocalciferol (VITAMIN D2) 50000 units capsule Take 50,000 Units by mouth once a week.    . etonogestrel-ethinyl estradiol (NUVARING) 0.12-0.015 MG/24HR vaginal ring Place 1 each vaginally every 28  (twenty-eight) days. Insert vaginally and leave in place for 3 consecutive weeks, then remove for 1 week.    . SUMAtriptan (IMITREX) 50 MG tablet One tab at start of migraine. May repeat in 2 hours if headache persists or recurs. 10 tablet 5   No current facility-administered medications on file prior to visit.     PAST MEDICAL HISTORY: Past Medical History:  Diagnosis Date  . ADD (attention deficit disorder)   . Anxiety   . Back pain   . Chest pain   . Constipation   . Depression   . Fatty liver   . Liver function abnormality    HIGH  . Migraines   . Obesity   . Vitamin D deficiency     PAST SURGICAL HISTORY: No past surgical history on file.  SOCIAL HISTORY: Social History  Substance Use Topics  . Smoking status: Former Smoker    Years: 4.00  . Smokeless tobacco: Never Used  . Alcohol use Yes     Comment: occasional    FAMILY HISTORY: Family History  Problem Relation Age of Onset  . Diabetes Sister 5    Type 1  . Celiac disease Sister   . Diabetes Mellitus II Maternal Grandmother   . Factor V Leiden deficiency Maternal Grandmother   . Factor V Leiden deficiency Mother   . Obesity Mother   . Hyperlipidemia Father   .  Sleep apnea Father   . Obesity Father   . Cancer Paternal Grandmother     breast  . Breast cancer Paternal Grandmother   . Heart Problems Sister     left ventricular non-compaction  . Diabetes Mellitus II Maternal Grandfather   . Cancer Paternal Grandfather     throat/throat and lung, smoker    ROS: Review of Systems  Constitutional: Positive for weight loss.  Gastrointestinal: Negative for nausea and vomiting.  Genitourinary: Negative for frequency.  Endo/Heme/Allergies: Negative for polydipsia.       Negative hypoglycemia    PHYSICAL EXAM: Blood pressure 118/86, pulse 88, temperature 98.4 F (36.9 C), temperature source Oral, height 5' 10"  (1.778 m), weight 264 lb (119.7 kg), last menstrual period 04/03/2017, SpO2 96 %. Body mass  index is 37.88 kg/m. Physical Exam  Constitutional: She is oriented to person, place, and time. She appears well-developed and well-nourished.  Cardiovascular: Normal rate.   Pulmonary/Chest: Effort normal.  Musculoskeletal: Normal range of motion.  Neurological: She is oriented to person, place, and time.  Skin: Skin is warm and dry.  Psychiatric: She has a normal mood and affect. Her behavior is normal.  Vitals reviewed.   RECENT LABS AND TESTS: BMET    Component Value Date/Time   NA 137 03/01/2017 1041   K 4.6 03/01/2017 1041   CL 102 03/01/2017 1041   CO2 21 03/01/2017 1041   GLUCOSE 97 03/01/2017 1041   GLUCOSE 101 (H) 08/26/2015 0751   BUN 13 03/01/2017 1041   CREATININE 0.86 03/01/2017 1041   CREATININE 0.79 05/30/2014 0859   CALCIUM 9.1 03/01/2017 1041   GFRNONAA 94 03/01/2017 1041   GFRNONAA >89 05/30/2014 0859   GFRAA 109 03/01/2017 1041   GFRAA >89 05/30/2014 0859   Lab Results  Component Value Date   HGBA1C 5.4 03/01/2017   Lab Results  Component Value Date   INSULIN 32.5 (H) 03/01/2017   CBC    Component Value Date/Time   WBC 10.6 03/01/2017 1041   WBC 9.2 08/26/2015 0751   RBC 4.64 03/01/2017 1041   RBC 4.57 08/26/2015 0751   HGB 13.0 08/26/2015 0751   HCT 38.6 03/01/2017 1041   PLT 276.0 08/26/2015 0751   MCV 83 03/01/2017 1041   MCH 28.4 03/01/2017 1041   MCH 28.4 05/30/2014 0859   MCHC 34.2 03/01/2017 1041   MCHC 33.5 08/26/2015 0751   RDW 13.8 03/01/2017 1041   LYMPHSABS 2.3 03/01/2017 1041   MONOABS 0.7 08/26/2015 0751   EOSABS 0.1 03/01/2017 1041   BASOSABS 0.0 03/01/2017 1041   Iron/TIBC/Ferritin/ %Sat No results found for: IRON, TIBC, FERRITIN, IRONPCTSAT Lipid Panel     Component Value Date/Time   CHOL 182 03/01/2017 1041   TRIG 170 (H) 03/01/2017 1041   HDL 46 03/01/2017 1041   CHOLHDL 3 09/14/2016 0827   VLDL 34.2 09/14/2016 0827   LDLCALC 102 (H) 03/01/2017 1041   Hepatic Function Panel     Component Value Date/Time     PROT 6.6 03/01/2017 1041   ALBUMIN 3.9 03/01/2017 1041   AST 30 03/01/2017 1041   ALT 45 (H) 03/01/2017 1041   ALKPHOS 94 03/01/2017 1041   BILITOT 0.4 03/01/2017 1041   BILIDIR 0.1 08/26/2015 0751   IBILI 0.3 05/30/2014 0859      Component Value Date/Time   TSH 3.200 03/01/2017 1041   TSH 3.72 08/26/2015 0751   TSH 2.976 05/30/2014 0859    ASSESSMENT AND PLAN: Prediabetes  At risk for diabetes  mellitus - Plan: metFORMIN (GLUCOPHAGE) 500 MG tablet  Class 2 obesity without serious comorbidity with body mass index (BMI) of 37.0 to 37.9 in adult, unspecified obesity type  PLAN:  Pre-Diabetes Kathryn Huff will continue to work on weight loss, exercise, and decreasing simple carbohydrates in her diet to help decrease the risk of diabetes. We dicussed metformin including benefits and risks. She was informed that eating too many simple carbohydrates or too many calories at one sitting increases the likelihood of GI side effects. Kathryn Huff agrees to continue to take metformin for now and a prescription was written today for 1 month refill. Kathryn Huff agreed to follow up with [name] as directed to monitor her progress.  Diabetes risk counselling Kathryn Huff was given extended (at least 15 minutes) diabetes prevention counseling today. She is 26 y.o. female and has risk factors for diabetes including obesity and pre-diabetes. We discussed intensive lifestyle modifications today with an emphasis on weight loss as well as increasing exercise and decreasing simple carbohydrates in her diet.  Obesity Kathryn Huff is currently in the action stage of change. As such, her goal is to continue with weight loss efforts She has agreed to keep a food journal with 400 to 600 calories and 35 grams of protein daily and follow the Category 2 plan Kathryn Huff has been instructed to work up to a goal of 150 minutes of combined cardio and strengthening exercise per week or start walking with husband on trails on weekends for weight loss  and overall health benefits. We discussed the following Behavioral Modification Stratagies today: increasing lean protein intake and decreasing simple carbohydrates   Kathryn Huff has agreed to follow up with our clinic in 2 weeks. She was informed of the importance of frequent follow up visits to maximize her success with intensive lifestyle modifications for her multiple health conditions.  I, Doreene Nest, am acting as scribe for Dennard Nip, MD  I have reviewed the above documentation for accuracy and completeness, and I agree with the above. -Dennard Nip, MD

## 2017-04-12 ENCOUNTER — Ambulatory Visit: Payer: Self-pay | Admitting: Family

## 2017-04-25 ENCOUNTER — Ambulatory Visit (INDEPENDENT_AMBULATORY_CARE_PROVIDER_SITE_OTHER): Payer: 59 | Admitting: Family Medicine

## 2017-04-25 VITALS — BP 119/81 | HR 81 | Temp 97.8°F | Ht 70.0 in | Wt 262.0 lb

## 2017-04-25 DIAGNOSIS — Z6837 Body mass index (BMI) 37.0-37.9, adult: Secondary | ICD-10-CM | POA: Diagnosis not present

## 2017-04-25 DIAGNOSIS — K5909 Other constipation: Secondary | ICD-10-CM

## 2017-04-25 DIAGNOSIS — F3289 Other specified depressive episodes: Secondary | ICD-10-CM

## 2017-04-25 DIAGNOSIS — E669 Obesity, unspecified: Secondary | ICD-10-CM

## 2017-04-25 MED ORDER — DOCUSATE SODIUM 100 MG PO CAPS
100.0000 mg | ORAL_CAPSULE | Freq: Two times a day (BID) | ORAL | 0 refills | Status: DC
Start: 2017-04-25 — End: 2017-08-31

## 2017-04-25 MED ORDER — BUPROPION HCL ER (SR) 150 MG PO TB12: 150.0000 mg | ORAL_TABLET | Freq: Every day | ORAL | 0 refills | Status: DC

## 2017-04-25 MED FILL — BUPROPION SR 150 MG TABLET: 150 | 30 days supply | Qty: 30 | Fill #0

## 2017-04-25 MED FILL — DOK 100 MG SOFTGEL: 100 | 50 days supply | Qty: 100 | Fill #0

## 2017-04-25 NOTE — Progress Notes (Signed)
Office: [phone]  /  Fax: [phone]   HPI:   Chief Complaint: OBESITY Kathryn Huff is here to discuss her progress with her obesity treatment plan. She is on the  keep a food journal with 450 to 600 calories and 35 grams of protein at supper daily and follow the Category 2 plan and is following her eating plan approximately 75 % of the time. She states she is walking 20 minutes 2 times per week. Kathryn Huff continues to do well with weight loss. She notes hunger increased while at work but not at home. She eats to stay awake on night shift. Her weight is 262 lb (118.8 kg) today and has had a weight loss of 2 pounds over a period of 2 weeks since her last visit. She has lost 13 lbs since starting treatment with [name].  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 She often snacks when she is not hungry. Kathryn Huff sometimes feels she is out of control and then feels guilty that she made poor food choices. She has been working on behavior modification techniques to help reduce her emotional eating and has been somewhat successful. She shows no sign of suicidal or homicidal ideations.  Constipation Kathryn Huff notes constipation for the last few weeks, worse since attempting weight loss. She states BM are less frequent, only 1 time per week and are hard and painful with abdominal cramping and she often strains to go.   Depression screen [facility] 2/9 03/01/2017 09/14/2016  Decreased Interest 1 0  Down, Depressed, Hopeless 2 0  PHQ - 2 Score 3 0  Altered sleeping 1 -  Tired, decreased energy 3 -  Change in appetite 2 -  Feeling bad or failure about yourself  3 -  Trouble concentrating 3 -  Moving slowly or fidgety/restless 1 -  Suicidal thoughts 1 -  PHQ-9 Score 17 -     Wt Readings from Last 500 Encounters:  04/25/17 262 lb (118.8 kg)    04/11/17 264 lb (119.7 kg)  03/28/17 268 lb (121.6 kg)  03/14/17 277 lb (125.6 kg)  03/14/17 273 lb (123.8 kg)  03/01/17 275 lb (124.7 kg)  09/14/16 267 lb 12.8 oz (121.5 kg)  06/09/16 263 lb 12.8 oz (119.7 kg)  02/23/16 258 lb 2 oz (117.1 kg)  08/26/15 253 lb (114.8 kg)  08/07/15 258 lb 12.8 oz (117.4 kg)  06/08/15 259 lb 12.8 oz (117.8 kg)  03/06/15 249 lb 9.6 oz (113.2 kg)  09/10/14 240 lb (108.9 kg)  05/30/14 240 lb 0.6 oz (108.9 kg)  07/09/12 210 lb (95.3 kg)     ALLERGIES: No Known Allergies  MEDICATIONS: Current Outpatient Prescriptions on File Prior to Visit  Medication Sig Dispense Refill  . ergocalciferol (VITAMIN D2) 50000 units capsule Take 50,000 Units by mouth once a week.    . etonogestrel-ethinyl estradiol (NUVARING) 0.12-0.015 MG/24HR vaginal ring Place 1 each vaginally every 28 (twenty-eight) days. Insert vaginally and leave in place for 3 consecutive weeks, then remove for 1 week.    . metFORMIN (GLUCOPHAGE) 500 MG tablet Take 1 tablet (500 mg total) by mouth daily with breakfast. 30 tablet 0  . SUMAtriptan (IMITREX) 50 MG tablet One tab at start of migraine. May repeat in 2 hours if headache persists or recurs. 10 tablet 5   No current facility-administered medications on file prior to  visit.     PAST MEDICAL HISTORY: Past Medical History:  Diagnosis Date  . ADD (attention deficit disorder)   . Anxiety   . Back pain   . Chest pain   . Constipation   . Depression   . Fatty liver   . Liver function abnormality    HIGH  . Migraines   . Obesity   . Vitamin D deficiency     PAST SURGICAL HISTORY: No past surgical history on file.  SOCIAL HISTORY: Social History  Substance Use Topics  . Smoking status: Former Smoker    Years: 4.00  . Smokeless tobacco: Never Used  . Alcohol use Yes     Comment: occasional    FAMILY HISTORY: Family History  Problem Relation Age of Onset  . Diabetes Sister 5    Type 1  . Celiac disease Sister   .  Diabetes Mellitus II Maternal Grandmother   . Factor V Leiden deficiency Maternal Grandmother   . Factor V Leiden deficiency Mother   . Obesity Mother   . Hyperlipidemia Father   . Sleep apnea Father   . Obesity Father   . Cancer Paternal Grandmother     breast  . Breast cancer Paternal Grandmother   . Heart Problems Sister     left ventricular non-compaction  . Diabetes Mellitus II Maternal Grandfather   . Cancer Paternal Grandfather     throat/throat and lung, smoker    ROS: Review of Systems  Constitutional: Positive for weight loss.  Gastrointestinal: Positive for constipation.       Abdominal cramping  Psychiatric/Behavioral: Positive for depression. Negative for suicidal ideas.    PHYSICAL EXAM: Blood pressure 119/81, pulse 81, temperature 97.8 F (36.6 C), temperature source Oral, height 5' 10"  (1.778 m), weight 262 lb (118.8 kg), last menstrual period 04/03/2017, SpO2 96 %. Body mass index is 37.59 kg/m. Physical Exam  Constitutional: She is oriented to person, place, and time. She appears well-developed and well-nourished.  Cardiovascular: Normal rate.   Pulmonary/Chest: Effort normal.  Musculoskeletal: Normal range of motion.  Neurological: She is oriented to person, place, and time.  Skin: Skin is warm and dry.  Psychiatric: She has a normal mood and affect. Her behavior is normal.  Vitals reviewed.   RECENT LABS AND TESTS: BMET    Component Value Date/Time   NA 137 03/01/2017 1041   K 4.6 03/01/2017 1041   CL 102 03/01/2017 1041   CO2 21 03/01/2017 1041   GLUCOSE 97 03/01/2017 1041   GLUCOSE 101 (H) 08/26/2015 0751   BUN 13 03/01/2017 1041   CREATININE 0.86 03/01/2017 1041   CREATININE 0.79 05/30/2014 0859   CALCIUM 9.1 03/01/2017 1041   GFRNONAA 94 03/01/2017 1041   GFRNONAA >89 05/30/2014 0859   GFRAA 109 03/01/2017 1041   GFRAA >89 05/30/2014 0859   Lab Results  Component Value Date   HGBA1C 5.4 03/01/2017   Lab Results  Component Value  Date   INSULIN 32.5 (H) 03/01/2017   CBC    Component Value Date/Time   WBC 10.6 03/01/2017 1041   WBC 9.2 08/26/2015 0751   RBC 4.64 03/01/2017 1041   RBC 4.57 08/26/2015 0751   HGB 13.0 08/26/2015 0751   HCT 38.6 03/01/2017 1041   PLT 276.0 08/26/2015 0751   MCV 83 03/01/2017 1041   MCH 28.4 03/01/2017 1041   MCH 28.4 05/30/2014 0859   MCHC 34.2 03/01/2017 1041   MCHC 33.5 08/26/2015 0751   RDW 13.8 03/01/2017 1041  LYMPHSABS 2.3 03/01/2017 1041   MONOABS 0.7 08/26/2015 0751   EOSABS 0.1 03/01/2017 1041   BASOSABS 0.0 03/01/2017 1041   Iron/TIBC/Ferritin/ %Sat No results found for: IRON, TIBC, FERRITIN, IRONPCTSAT Lipid Panel     Component Value Date/Time   CHOL 182 03/01/2017 1041   TRIG 170 (H) 03/01/2017 1041   HDL 46 03/01/2017 1041   CHOLHDL 3 09/14/2016 0827   VLDL 34.2 09/14/2016 0827   LDLCALC 102 (H) 03/01/2017 1041   Hepatic Function Panel     Component Value Date/Time   PROT 6.6 03/01/2017 1041   ALBUMIN 3.9 03/01/2017 1041   AST 30 03/01/2017 1041   ALT 45 (H) 03/01/2017 1041   ALKPHOS 94 03/01/2017 1041   BILITOT 0.4 03/01/2017 1041   BILIDIR 0.1 08/26/2015 0751   IBILI 0.3 05/30/2014 0859      Component Value Date/Time   TSH 3.200 03/01/2017 1041   TSH 3.72 08/26/2015 0751   TSH 2.976 05/30/2014 0859    ASSESSMENT AND PLAN: Other depression - Plan: buPROPion (WELLBUTRIN SR) 150 MG 12 hr tablet  Other constipation - Plan: docusate sodium (COLACE) 100 MG capsule  Class 2 obesity without serious comorbidity with body mass index (BMI) of 37.0 to 37.9 in adult, unspecified obesity type  PLAN:  Depression with Emotional Eating Behaviors We discussed behavior modification techniques today to help Kathryn Huff deal with her emotional eating and depression. She has agreed to start to take Wellbutrin SR 150 mg every morning #30 with no refills and agreed to follow up as directed.  Constipation Kathryn Huff was informed decrease bowel movement frequency  is normal while losing weight, but stools should not be hard or painful. She was advised to increase her H20 intake and work on increasing her fiber intake. High fiber foods were discussed today. Kathryn Huff agrees to start Colace 100 mg bid #60 with no refills and follow up with our clinic in 2 weeks.  Obesity Kathryn Huff is currently in the action stage of change. As such, her goal is to continue with weight loss efforts She has agreed to keep a food journal with 450 to 600 calories and 35 grams of protein for dinner and follow the Category 2 plan Kathryn Huff has been instructed to work up to a goal of 150 minutes of combined cardio and strengthening exercise per week for weight loss and overall health benefits. We discussed the following Behavioral Modification Stratagies today: increasing lean protein intake, decreasing simple carbohydrates  and work on meal planning and easy cooking plans  Kathryn Huff has agreed to follow up with our clinic in 2 weeks. She was informed of the importance of frequent follow up visits to maximize her success with intensive lifestyle modifications for her multiple health conditions.  I, Doreene Nest, am acting as scribe for Dennard Nip, MD  I have reviewed the above documentation for accuracy and completeness, and I agree with the above. -Dennard Nip, MD

## 2017-05-09 ENCOUNTER — Ambulatory Visit (INDEPENDENT_AMBULATORY_CARE_PROVIDER_SITE_OTHER): Payer: 59 | Admitting: Family Medicine

## 2017-05-09 VITALS — BP 119/79 | HR 103 | Temp 98.0°F | Ht 70.0 in | Wt 264.0 lb

## 2017-05-09 DIAGNOSIS — Z6837 Body mass index (BMI) 37.0-37.9, adult: Secondary | ICD-10-CM | POA: Diagnosis not present

## 2017-05-09 DIAGNOSIS — Z9189 Other specified personal risk factors, not elsewhere classified: Secondary | ICD-10-CM | POA: Diagnosis not present

## 2017-05-09 DIAGNOSIS — E669 Obesity, unspecified: Secondary | ICD-10-CM | POA: Diagnosis not present

## 2017-05-09 DIAGNOSIS — E8881 Metabolic syndrome: Secondary | ICD-10-CM | POA: Diagnosis not present

## 2017-05-09 MED ORDER — METFORMIN HCL 500 MG PO TABS: 500.0000 mg | ORAL_TABLET | Freq: Every day | ORAL | 0 refills | Status: DC

## 2017-05-09 NOTE — Progress Notes (Signed)
Office: [phone]  /  Fax: [phone]   HPI:   Chief Complaint: OBESITY Kathryn Huff is here to discuss her progress with her obesity treatment plan. She is on the  follow the Category 2 plan and is following her eating plan approximately 80 % of the time. She states she is walking 15 to 20 minutes 3 times per week. Kathryn Huff has retained some fluid. She is journaling more for dinner but getting bored with breakfast and would like more freedom. Her weight is 264 lb (119.7 kg) today and has had a weight gain of 2 pounds over a period of 2 weeks since her last visit. She has lost 11 lbs since starting treatment with [name].  Insulin Resistance Kathryn Huff has a diagnosis of insulin resistance based on her elevated fasting insulin level >5 and still struggling with polyphagia. Although Kathryn Huff's blood glucose readings are still under good control, insulin resistance puts her at greater risk of metabolic syndrome and diabetes. She is taking metformin currently and continues to work on diet and exercise to decrease risk of diabetes.  At risk for diabetes Kathryn Huff is at higher than average risk for developing diabetes due to her obesity and insulin resistance. She currently denies polyuria or polydipsia.   ALLERGIES: No Known Allergies  MEDICATIONS: Current Outpatient Prescriptions on File Prior to Visit  Medication Sig Dispense Refill  . buPROPion (WELLBUTRIN SR) 150 MG 12 hr tablet Take 1 tablet (150 mg total) by mouth daily. 30 tablet 0  . docusate sodium (COLACE) 100 MG capsule Take 1 capsule (100 mg total) by mouth 2 (two) times daily. 60 capsule 0  . ergocalciferol (VITAMIN D2) 50000 units capsule Take 50,000 Units by mouth once a week.    . etonogestrel-ethinyl estradiol (NUVARING) 0.12-0.015 MG/24HR vaginal ring Place 1 each vaginally every 28 (twenty-eight) days. Insert vaginally and leave in place for 3 consecutive weeks, then remove for 1 week.    . metFORMIN (GLUCOPHAGE) 500 MG tablet Take 1  tablet (500 mg total) by mouth daily with breakfast. 30 tablet 0  . SUMAtriptan (IMITREX) 50 MG tablet One tab at start of migraine. May repeat in 2 hours if headache persists or recurs. 10 tablet 5   No current facility-administered medications on file prior to visit.     PAST MEDICAL HISTORY: Past Medical History:  Diagnosis Date  . ADD (attention deficit disorder)   . Anxiety   . Back pain   . Chest pain   . Constipation   . Depression   . Fatty liver   . Liver function abnormality    HIGH  . Migraines   . Obesity   . Vitamin D deficiency     PAST SURGICAL HISTORY: No past surgical history on file.  SOCIAL HISTORY: Social History  Substance Use Topics  . Smoking status: Former Smoker    Years: 4.00  . Smokeless tobacco: Never Used  . Alcohol use Yes     Comment: occasional    FAMILY HISTORY: Family History  Problem Relation Age of Onset  . Diabetes Sister 5       Type 1  . Celiac disease Sister   . Diabetes Mellitus II Maternal Grandmother   . Factor V Leiden deficiency Maternal Grandmother   . Factor V Leiden deficiency Mother   . Obesity Mother   . Hyperlipidemia Father   . Sleep apnea Father   . Obesity Father   . Cancer Paternal Grandmother        breast  .  Breast cancer Paternal Grandmother   . Heart Problems Sister        left ventricular non-compaction  . Diabetes Mellitus II Maternal Grandfather   . Cancer Paternal Grandfather        throat/throat and lung, smoker    ROS: Review of Systems  Constitutional: Negative for weight loss.  Genitourinary: Negative for frequency.  Endo/Heme/Allergies: Negative for polydipsia.       Polyphagia    PHYSICAL EXAM: Blood pressure 119/79, pulse (!) 103, temperature 98 F (36.7 C), temperature source Oral, height 5' 10"  (1.778 m), weight 264 lb (119.7 kg), SpO2 98 %. Body mass index is 37.88 kg/m. Physical Exam  Constitutional: She is oriented to person, place, and time. She appears well-developed  and well-nourished.  Cardiovascular: Normal rate.   Pulmonary/Chest: Effort normal.  Musculoskeletal: Normal range of motion.  Neurological: She is oriented to person, place, and time.  Skin: Skin is warm and dry.  Psychiatric: She has a normal mood and affect. Her behavior is normal.  Vitals reviewed.   RECENT LABS AND TESTS: BMET    Component Value Date/Time   NA 137 03/01/2017 1041   K 4.6 03/01/2017 1041   CL 102 03/01/2017 1041   CO2 21 03/01/2017 1041   GLUCOSE 97 03/01/2017 1041   GLUCOSE 101 (H) 08/26/2015 0751   BUN 13 03/01/2017 1041   CREATININE 0.86 03/01/2017 1041   CREATININE 0.79 05/30/2014 0859   CALCIUM 9.1 03/01/2017 1041   GFRNONAA 94 03/01/2017 1041   GFRNONAA >89 05/30/2014 0859   GFRAA 109 03/01/2017 1041   GFRAA >89 05/30/2014 0859   Lab Results  Component Value Date   HGBA1C 5.4 03/01/2017   Lab Results  Component Value Date   INSULIN 32.5 (H) 03/01/2017   CBC    Component Value Date/Time   WBC 10.6 03/01/2017 1041   WBC 9.2 08/26/2015 0751   RBC 4.64 03/01/2017 1041   RBC 4.57 08/26/2015 0751   HGB 13.0 08/26/2015 0751   HCT 38.6 03/01/2017 1041   PLT 276.0 08/26/2015 0751   MCV 83 03/01/2017 1041   MCH 28.4 03/01/2017 1041   MCH 28.4 05/30/2014 0859   MCHC 34.2 03/01/2017 1041   MCHC 33.5 08/26/2015 0751   RDW 13.8 03/01/2017 1041   LYMPHSABS 2.3 03/01/2017 1041   MONOABS 0.7 08/26/2015 0751   EOSABS 0.1 03/01/2017 1041   BASOSABS 0.0 03/01/2017 1041   Iron/TIBC/Ferritin/ %Sat No results found for: IRON, TIBC, FERRITIN, IRONPCTSAT Lipid Panel     Component Value Date/Time   CHOL 182 03/01/2017 1041   TRIG 170 (H) 03/01/2017 1041   HDL 46 03/01/2017 1041   CHOLHDL 3 09/14/2016 0827   VLDL 34.2 09/14/2016 0827   LDLCALC 102 (H) 03/01/2017 1041   Hepatic Function Panel     Component Value Date/Time   PROT 6.6 03/01/2017 1041   ALBUMIN 3.9 03/01/2017 1041   AST 30 03/01/2017 1041   ALT 45 (H) 03/01/2017 1041   ALKPHOS  94 03/01/2017 1041   BILITOT 0.4 03/01/2017 1041   BILIDIR 0.1 08/26/2015 0751   IBILI 0.3 05/30/2014 0859      Component Value Date/Time   TSH 3.200 03/01/2017 1041   TSH 3.72 08/26/2015 0751   TSH 2.976 05/30/2014 0859    ASSESSMENT AND PLAN: Insulin resistance - Plan: metFORMIN (GLUCOPHAGE) 500 MG tablet  At risk for diabetes mellitus  Class 2 obesity without serious comorbidity with body mass index (BMI) of 37.0 to 37.9 in adult,  unspecified obesity type  PLAN:  Insulin Resistance Kathryn Huff will continue to work on weight loss, exercise, and decreasing simple carbohydrates in her diet to help decrease the risk of diabetes. We dicussed metformin including benefits and risks. She was informed that eating too many simple carbohydrates or too many calories at one sitting increases the likelihood of GI side effects. Kathryn Huff requested metformin for now and prescription was written today for 1 month refill. Kathryn Huff agreed to follow up with [name] as directed to monitor her progress.  Diabetes risk counselling Kathryn Huff was given extended (at least 15 minutes) diabetes prevention counseling today. She is 26 y.o. female and has risk factors for diabetes including obesity and insulin resistance. We discussed intensive lifestyle modifications today with an emphasis on weight loss as well as increasing exercise and decreasing simple carbohydrates in her diet.  Obesity Kathryn Huff is currently in the action stage of change. As such, her goal is to continue with weight loss efforts She has agreed to keep a food journal with 1300 to 1600 calories and 100+ grams of protein  Kathryn Huff has been instructed to work up to a goal of 150 minutes of combined cardio and strengthening exercise per week for weight loss and overall health benefits. We discussed the following Behavioral Modification Strategies today: increasing lean protein intake and decreasing simple carbohydrates   Kathryn Huff has agreed to follow up with our  clinic in 2 weeks. She was informed of the importance of frequent follow up visits to maximize her success with intensive lifestyle modifications for her multiple health conditions.  I, Kathryn Huff, am acting as scribe for Kathryn Nip, MD  I have reviewed the above documentation for accuracy and completeness, and I agree with the above. -Kathryn Nip, MD  OBESITY BEHAVIORAL INTERVENTION VISIT  Today's visit was # 6 out of 22.  Starting weight: 275 Starting date: 03/01/17 Todays weight : 264 Total lbs lost to date: 11 (Patients must lose 7 lbs in the first 6 months to continue with counseling)   ASK: We discussed the diagnosis of obesity with Kathryn Huff today and Kathryn Huff agreed to give [name] permission to discuss obesity behavioral modification therapy today.  ASSESS: Kathryn Huff has the diagnosis of obesity and her BMI today is 13 Kathryn Huff is in the action stage of change   ADVISE: Kathryn Huff was educated on the multiple health risks of obesity as well as the benefit of weight loss to improve her health. She was advised of the need for long term treatment and the importance of lifestyle modifications.  AGREE: Multiple dietary modification options and treatment options were discussed and  Kathryn Huff agreed to keep a food journal with 1300 to 1600 calories and 100+ grams of protein  We discussed the following Behavioral Modification Strategies today: increasing lean protein intake and decreasing simple carbohydrates

## 2017-05-23 ENCOUNTER — Ambulatory Visit (INDEPENDENT_AMBULATORY_CARE_PROVIDER_SITE_OTHER): Payer: 59 | Admitting: Family Medicine

## 2017-05-23 VITALS — BP 108/74 | HR 89 | Temp 98.0°F | Ht 70.0 in | Wt 258.0 lb

## 2017-05-23 DIAGNOSIS — E8881 Metabolic syndrome: Secondary | ICD-10-CM | POA: Diagnosis not present

## 2017-05-23 DIAGNOSIS — Z9189 Other specified personal risk factors, not elsewhere classified: Secondary | ICD-10-CM

## 2017-05-23 DIAGNOSIS — E669 Obesity, unspecified: Secondary | ICD-10-CM | POA: Diagnosis not present

## 2017-05-23 DIAGNOSIS — Z6837 Body mass index (BMI) 37.0-37.9, adult: Secondary | ICD-10-CM

## 2017-05-23 DIAGNOSIS — E559 Vitamin D deficiency, unspecified: Secondary | ICD-10-CM | POA: Diagnosis not present

## 2017-05-23 NOTE — Progress Notes (Signed)
Office: [phone]  /  Fax: [phone]   HPI:   Chief Complaint: OBESITY Kathryn Huff is here to discuss her progress with her obesity treatment plan. She is on the  follow the Category 3 plan and is following her eating plan approximately 95 % of the time. She states she is walking 20 to 30 minutes 4 times per week. Kathryn Huff continues to do well with weight loss. She has not been journaling all meals but is getting better at getting all her proteins. Her weight is 258 lb (117 kg) today and has had a weight loss of 6 pounds over a period of 2 weeks since her last visit. She has lost 17 lbs since starting treatment with [name].  Vitamin D deficiency Kathryn Huff has a diagnosis of vitamin D deficiency. She is currently taking vit D and denies nausea, vomiting or muscle weakness.  Insulin Resistance Kathryn Huff has a diagnosis of insulin resistance based on her elevated fasting insulin level >5. Although Kathryn Huff's blood glucose readings are still under good control, insulin resistance puts her at greater risk of metabolic syndrome and diabetes. She is taking metformin currently and continues to work on diet and exercise to decrease risk of diabetes. She denies polyphagia.  At risk for diabetes Kathryn Huff is at higher than average risk for developing diabetes due to her obesity and insulin resistance. She currently denies polyuria or polydipsia.  ALLERGIES: No Known Allergies  MEDICATIONS: Current Outpatient Prescriptions on File Prior to Visit  Medication Sig Dispense Refill   buPROPion (WELLBUTRIN SR) 150 MG 12 hr tablet Take 1 tablet (150 mg total) by mouth daily. 30 tablet 0   docusate sodium (COLACE) 100 MG capsule Take 1 capsule (100 mg total) by mouth 2 (two) times daily. 60 capsule 0   ergocalciferol (VITAMIN D2) 50000 units capsule Take 50,000 Units by mouth once a week.     etonogestrel-ethinyl estradiol (NUVARING) 0.12-0.015 MG/24HR vaginal ring Place 1 each vaginally every 28 (twenty-eight)  days. Insert vaginally and leave in place for 3 consecutive weeks, then remove for 1 week.     metFORMIN (GLUCOPHAGE) 500 MG tablet Take 1 tablet (500 mg total) by mouth daily with breakfast. 30 tablet 0   SUMAtriptan (IMITREX) 50 MG tablet One tab at start of migraine. May repeat in 2 hours if headache persists or recurs. 10 tablet 5   No current facility-administered medications on file prior to visit.     PAST MEDICAL HISTORY: Past Medical History:  Diagnosis Date   ADD (attention deficit disorder)    Anxiety    Back pain    Chest pain    Constipation    Depression    Fatty liver    Liver function abnormality    HIGH   Migraines    Obesity    Vitamin D deficiency     PAST SURGICAL HISTORY: No past surgical history on file.  SOCIAL HISTORY: Social History  Substance Use Topics   Smoking status: Former Smoker    Years: 4.00   Smokeless tobacco: Never Used   Alcohol use Yes     Comment: occasional    FAMILY HISTORY: Family History  Problem Relation Age of Onset   Diabetes Sister 5       Type 1   Celiac disease Sister    Diabetes Mellitus II Maternal Grandmother    Factor V Leiden deficiency Maternal Grandmother    Factor V Leiden deficiency Mother    Obesity Mother    Hyperlipidemia Father  Sleep apnea Father    Obesity Father    Cancer Paternal Grandmother        breast   Breast cancer Paternal Grandmother    Heart Problems Sister        left ventricular non-compaction   Diabetes Mellitus II Maternal Grandfather    Cancer Paternal Grandfather        throat/throat and lung, smoker    ROS: Review of Systems  Constitutional: Positive for weight loss.  Gastrointestinal: Negative for nausea and vomiting.  Genitourinary: Negative for frequency.  Musculoskeletal:       Negative muscle weakness  Endo/Heme/Allergies: Negative for polydipsia.       Negative polyphagia    PHYSICAL EXAM: Blood pressure 108/74, pulse 89,  temperature 98 F (36.7 C), temperature source Oral, height 5' 10"  (1.778 m), weight 258 lb (117 kg), SpO2 99 %. Body mass index is 37.02 kg/m. Physical Exam  Constitutional: She is oriented to person, place, and time. She appears well-developed and well-nourished.  Cardiovascular: Normal rate.   Pulmonary/Chest: Effort normal.  Musculoskeletal: Normal range of motion.  Neurological: She is oriented to person, place, and time.  Skin: Skin is warm and dry.  Psychiatric: She has a normal mood and affect. Her behavior is normal.  Vitals reviewed.   RECENT LABS AND TESTS: BMET    Component Value Date/Time   NA 137 03/01/2017 1041   K 4.6 03/01/2017 1041   CL 102 03/01/2017 1041   CO2 21 03/01/2017 1041   GLUCOSE 97 03/01/2017 1041   GLUCOSE 101 (H) 08/26/2015 0751   BUN 13 03/01/2017 1041   CREATININE 0.86 03/01/2017 1041   CREATININE 0.79 05/30/2014 0859   CALCIUM 9.1 03/01/2017 1041   GFRNONAA 94 03/01/2017 1041   GFRNONAA >89 05/30/2014 0859   GFRAA 109 03/01/2017 1041   GFRAA >89 05/30/2014 0859   Lab Results  Component Value Date   HGBA1C 5.4 03/01/2017   Lab Results  Component Value Date   INSULIN 32.5 (H) 03/01/2017   CBC    Component Value Date/Time   WBC 10.6 03/01/2017 1041   WBC 9.2 08/26/2015 0751   RBC 4.64 03/01/2017 1041   RBC 4.57 08/26/2015 0751   HGB 13.0 08/26/2015 0751   HCT 38.6 03/01/2017 1041   PLT 276.0 08/26/2015 0751   MCV 83 03/01/2017 1041   MCH 28.4 03/01/2017 1041   MCH 28.4 05/30/2014 0859   MCHC 34.2 03/01/2017 1041   MCHC 33.5 08/26/2015 0751   RDW 13.8 03/01/2017 1041   LYMPHSABS 2.3 03/01/2017 1041   MONOABS 0.7 08/26/2015 0751   EOSABS 0.1 03/01/2017 1041   BASOSABS 0.0 03/01/2017 1041   Iron/TIBC/Ferritin/ %Sat No results found for: IRON, TIBC, FERRITIN, IRONPCTSAT Lipid Panel     Component Value Date/Time   CHOL 182 03/01/2017 1041   TRIG 170 (H) 03/01/2017 1041   HDL 46 03/01/2017 1041   CHOLHDL 3 09/14/2016  0827   VLDL 34.2 09/14/2016 0827   LDLCALC 102 (H) 03/01/2017 1041   Hepatic Function Panel     Component Value Date/Time   PROT 6.6 03/01/2017 1041   ALBUMIN 3.9 03/01/2017 1041   AST 30 03/01/2017 1041   ALT 45 (H) 03/01/2017 1041   ALKPHOS 94 03/01/2017 1041   BILITOT 0.4 03/01/2017 1041   BILIDIR 0.1 08/26/2015 0751   IBILI 0.3 05/30/2014 0859      Component Value Date/Time   TSH 3.200 03/01/2017 1041   TSH 3.72 08/26/2015 0751   TSH  2.976 05/30/2014 0859    ASSESSMENT AND PLAN: Insulin resistance  Vitamin D deficiency  At risk for diabetes mellitus  Class 2 obesity without serious comorbidity with body mass index (BMI) of 37.0 to 37.9 in adult, unspecified obesity type  PLAN:  Vitamin D Deficiency Kathryn Huff was informed that low vitamin D levels contributes to fatigue and are associated with obesity, breast, and colon cancer. She agrees to continue to take prescription Vit D @50 ,000 IU every week, we will re-check labs in 2 weeks and will follow up for routine testing of vitamin D, at least 2-3 times per year. She was informed of the risk of over-replacement of vitamin D and agrees to not increase her dose unless he discusses this with [name] first.  Insulin Resistance Kathryn Huff will continue to work on weight loss, exercise, and decreasing simple carbohydrates in her diet to help decrease the risk of diabetes. We dicussed metformin including benefits and risks. She was informed that eating too many simple carbohydrates or too many calories at one sitting increases the likelihood of GI side effects. Kathryn Huff agreed to continue metformin for now and prescription was not written today. Kathryn Huff agreed to follow up with [name] as directed to monitor her progress. We will re-check labs in 2 weeks.  Diabetes risk counselling Kathryn Huff was given extended (at least 15 minutes) diabetes prevention counseling today. She is 26 y.o. female and has risk factors for diabetes including obesity and insulin  resistance. We discussed intensive lifestyle modifications today with an emphasis on weight loss as well as increasing exercise and decreasing simple carbohydrates in her diet.  Obesity Kathryn Huff is currently in the action stage of change. As such, her goal is to continue with weight loss efforts She has agreed to keep a food journal with 1300 to 1600 calories and 100+ grams of protein  Kathryn Huff has been instructed to work up to a goal of 150 minutes of combined cardio and strengthening exercise per week for weight loss and overall health benefits. We discussed the following Behavioral Modification Strategies today: increasing lean protein intake and increase H2O intake  Kathryn Huff has agreed to follow up with our clinic in 2 to 3 weeks. She was informed of the importance of frequent follow up visits to maximize her success with intensive lifestyle modifications for her multiple health conditions.  I, Doreene Nest, am acting as scribe for Dennard Nip, MD  I have reviewed the above documentation for accuracy and completeness, and I agree with the above. -Dennard Nip, MD  OBESITY BEHAVIORAL INTERVENTION VISIT  Today's visit was # 7 out of 22.  Starting weight: 275 lbs Starting date: 03/01/17 Today's weight : 258 lbs Today's date: 05/23/2017 Total lbs lost to date: 17 (Patients must lose 7 lbs in the first 6 months to continue with counseling)   ASK: We discussed the diagnosis of obesity with Hulan Fray today and Jaliyah agreed to give [name] permission to discuss obesity behavioral modification therapy today.  ASSESS: Saren has the diagnosis of obesity and her BMI today is 37.1 Adiana is in the action stage of change   ADVISE: Liane was educated on the multiple health risks of obesity as well as the benefit of weight loss to improve her health. She was advised of the need for long term treatment and the importance of lifestyle modifications.  AGREE: Multiple dietary modification options  and treatment options were discussed and  Corry agreed to keep a food journal with 1300 to 1600 calories and 100+ grams  of protein  We discussed the following Behavioral Modification Strategies today: increasing lean protein intake and increase H2O intake

## 2017-06-01 MED FILL — NUVARING VAGINAL RING: 0.12-0.015 | 84 days supply | Qty: 3 | Fill #1

## 2017-06-06 ENCOUNTER — Encounter (INDEPENDENT_AMBULATORY_CARE_PROVIDER_SITE_OTHER): Payer: Self-pay

## 2017-06-06 ENCOUNTER — Other Ambulatory Visit (INDEPENDENT_AMBULATORY_CARE_PROVIDER_SITE_OTHER): Payer: Self-pay

## 2017-06-06 DIAGNOSIS — E8881 Metabolic syndrome: Secondary | ICD-10-CM

## 2017-06-06 DIAGNOSIS — E88819 Insulin resistance, unspecified: Secondary | ICD-10-CM

## 2017-06-06 DIAGNOSIS — E559 Vitamin D deficiency, unspecified: Secondary | ICD-10-CM

## 2017-06-06 DIAGNOSIS — F3289 Other specified depressive episodes: Secondary | ICD-10-CM

## 2017-06-06 MED ORDER — ERGOCALCIFEROL 1.25 MG (50000 UT) PO CAPS: 50000.0000 [IU] | ORAL_CAPSULE | ORAL | 0 refills | Status: DC

## 2017-06-06 MED ORDER — METFORMIN HCL 500 MG PO TABS: 500.0000 mg | ORAL_TABLET | Freq: Every day | ORAL | 0 refills | Status: DC

## 2017-06-06 MED ORDER — BUPROPION HCL ER (SR) 150 MG PO TB12
150.0000 mg | ORAL_TABLET | Freq: Every day | ORAL | 0 refills | Status: DC
Start: 2017-06-06 — End: 2017-07-27

## 2017-06-06 MED FILL — VIT D2 1.25 MG (50,000 UNIT: 1.25 MG | 28 days supply | Qty: 4 | Fill #0

## 2017-06-06 MED FILL — BUPROPION SR 150 MG TABLET: 150 | 30 days supply | Qty: 30 | Fill #0

## 2017-06-06 MED FILL — metFORMIN HCL 500 MG TABS: 500 | 30 days supply | Qty: 30 | Fill #0

## 2017-06-14 ENCOUNTER — Encounter: Payer: Self-pay | Admitting: Family

## 2017-06-14 ENCOUNTER — Ambulatory Visit (INDEPENDENT_AMBULATORY_CARE_PROVIDER_SITE_OTHER): Payer: 59 | Admitting: Family

## 2017-06-14 VITALS — BP 113/59 | HR 85 | Temp 97.8°F | Resp 18 | Ht 70.0 in | Wt 261.0 lb

## 2017-06-14 DIAGNOSIS — Z0000 Encounter for general adult medical examination without abnormal findings: Secondary | ICD-10-CM

## 2017-06-14 LAB — URINALYSIS, ROUTINE W REFLEX MICROSCOPIC
Bilirubin Urine: NEGATIVE
Ketones, ur: NEGATIVE
Leukocytes, UA: NEGATIVE
Nitrite: POSITIVE — AB
PH: 6 (ref 5.0–8.0)
RBC / HPF: NONE SEEN (ref 0–?)
Specific Gravity, Urine: >=1.03 — AB (ref 1.000–1.030)
URINE GLUCOSE: NEGATIVE
UROBILINOGEN UA: 0.2 (ref 0.0–1.0)

## 2017-06-14 MED ORDER — BETAMETHASONE DIPROPIONATE 0.05 % EX CREA
TOPICAL_CREAM | Freq: Two times a day (BID) | CUTANEOUS | 1 refills | Status: DC
Start: 2017-06-14 — End: 2017-08-31

## 2017-06-14 MED FILL — BETAMETHASONE DP 0.05% CRM: 0.05 | 15 days supply | Qty: 30 | Fill #0

## 2017-06-14 NOTE — Patient Instructions (Signed)
Please continue your healthy diet and regular exercise. Go to the lab prior to leaving.

## 2017-06-14 NOTE — Progress Notes (Signed)
Subjective:    Patient ID: Kathryn Huff, female    DOB: Sep 03, 1991, 26 y.o.   MRN: [id number]  HPI  Ms. Kathryn Huff is a 26 yr old female who presents today for cpx.  Immunizations:  Unsure of last tetanus.  Diet: healthy Exercise: some exercise, 30 minutes 3 times a week Pap Smear: 3/18 with gyn Vision:  2 years ago Dental: up to date  Wt Readings from Last 3 Encounters:  06/14/17 261 lb (118.4 kg)  05/23/17 258 lb (117 kg)  05/09/17 264 lb (119.7 kg)   Reports + eczema rash, right back of leg Review of Systems  Constitutional: Negative for unexpected weight change.  HENT: Negative for hearing loss and rhinorrhea.   Eyes: Negative for visual disturbance.  Respiratory: Negative for cough.   Cardiovascular: Negative for leg swelling.  Gastrointestinal: Negative for constipation and diarrhea.  Genitourinary: Negative for dyspareunia and frequency.  Musculoskeletal: Negative for arthralgias and myalgias.  Skin: Positive for rash.  Neurological: Negative for headaches.  Hematological: Negative for adenopathy.  Psychiatric/Behavioral:       Denies depression/anxiety   Past Medical History:  Diagnosis Date  . ADD (attention deficit disorder)   . Anxiety   . Back pain   . Chest pain   . Constipation   . Depression   . Fatty liver   . Liver function abnormality    HIGH  . Migraines   . Obesity   . Vitamin D deficiency      Social History   Social History  . Marital status: Married    Spouse name: Kathryn Huff  . Number of children: N/A  . Years of education: N/A   Occupational History  . XRAY tech & Admission Associate [place] History Main Topics  . Smoking status: Former Smoker    Years: 4.00  . Smokeless tobacco: Never Used  . Alcohol use Yes     Comment: occasional  . Drug use: No  . Sexual activity: Yes    Partners: Male    Birth control/ protection: Other-see comments     Comment: nuvaring   Other Topics Concern  . 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 No pertinent surgical history.  Family History  Problem Relation Age of Onset  . Diabetes Sister 5       Type 1  . Celiac disease Sister   . Diabetes Mellitus II Maternal Grandmother   . Factor V Leiden deficiency Maternal Grandmother   . Factor V Leiden deficiency Mother   . Obesity Mother   . Hyperlipidemia Father   . Sleep apnea Father   . Obesity Father   . Cancer Paternal Grandmother        breast  . Breast cancer Paternal Grandmother   . Heart Problems Sister        left ventricular non-compaction  . Diabetes Mellitus II Maternal Grandfather   . Cancer Paternal Grandfather        throat/throat and lung, smoker    No Known Allergies  Current Outpatient Prescriptions on File Prior to Visit  Medication Sig Dispense Refill  . buPROPion (WELLBUTRIN SR) 150 MG 12 hr tablet Take  1 tablet (150 mg total) by mouth daily. 30 tablet 0  . docusate sodium (COLACE) 100 MG capsule Take 1 capsule (100 mg total) by mouth 2 (two) times daily. 60 capsule 0  . ergocalciferol (VITAMIN D2) 50000 units capsule Take 1 capsule (50,000 Units total) by mouth once a week. 4 capsule 0  . etonogestrel-ethinyl estradiol (NUVARING) 0.12-0.015 MG/24HR vaginal ring Place 1 each vaginally every 28 (twenty-eight) days. Insert vaginally and leave in place for 3 consecutive weeks, then remove for 1 week.    . metFORMIN (GLUCOPHAGE) 500 MG tablet Take 1 tablet (500 mg total) by mouth daily with breakfast. 30 tablet 0  . SUMAtriptan (IMITREX) 50 MG tablet One tab at start of migraine. May repeat in 2 hours if headache persists or recurs. 10 tablet 5   No current facility-administered medications on file prior to visit.     BP (!) 113/59 (BP Location: Left Arm, Cuff Size:  Large)   Pulse 85   Temp 97.8 F (36.6 C) (Oral)   Resp 18   Ht 5' 10"  (1.778 m)   Wt 261 lb (118.4 kg)   LMP 05/29/2017   SpO2 96% Comment: LEFT ARM  BMI 37.45 kg/m       Objective:   Physical Exam  Physical Exam  Constitutional: Morbidly obese white femal. She is oriented to person, place, and time. She appears well-developed and well-nourished. No distress.  HENT:  Head: Normocephalic and atraumatic.  Right Ear: Tympanic membrane and ear canal normal.  Left Ear: Tympanic membrane and ear canal normal.  Mouth/Throat: Oropharynx is clear and moist.  Eyes: Pupils are equal, round, and reactive to light. No scleral icterus.  Neck: Normal range of motion. No thyromegaly present.  Cardiovascular: Normal rate and regular rhythm.   No murmur heard. Pulmonary/Chest: Effort normal and breath sounds normal. No respiratory distress. He has no wheezes. She has no rales. She exhibits no tenderness.  Abdominal: Soft. Bowel sounds are normal. She exhibits no distension and no mass. There is no tenderness. There is no rebound and no guarding.  Musculoskeletal: She exhibits no edema.  Lymphadenopathy:    She has no cervical adenopathy.  Neurological: She is alert and oriented to person, place, and time. She has normal patellar reflexes. She exhibits normal muscle tone. Coordination normal.  Skin: Skin is warm and dry.  Psychiatric: She has a normal mood and affect. Her behavior is normal. Judgment and thought content normal.  Breasts: Examined lying Right: Without masses, retractions, discharge or axillary adenopathy.  Left: Without masses, retractions, discharge or axillary adenopathy.  Pelvic: deferred          Assessment & Plan:   Preventative Care- discussed healthy diet, exercise, weight loss. She will obtain her immunization records and send them to me for review.  Obtain UA, other labs are up to date.  She is also instructed to arrange follow up eye exam.        Assessment  & Plan:

## 2017-06-15 ENCOUNTER — Ambulatory Visit (INDEPENDENT_AMBULATORY_CARE_PROVIDER_SITE_OTHER): Payer: 59 | Admitting: Family Medicine

## 2017-06-15 VITALS — BP 103/71 | HR 77 | Temp 98.3°F | Ht 70.0 in | Wt 258.0 lb

## 2017-06-15 DIAGNOSIS — IMO0001 Reserved for inherently not codable concepts without codable children: Secondary | ICD-10-CM

## 2017-06-15 DIAGNOSIS — Z6837 Body mass index (BMI) 37.0-37.9, adult: Secondary | ICD-10-CM

## 2017-06-15 DIAGNOSIS — E669 Obesity, unspecified: Secondary | ICD-10-CM

## 2017-06-15 DIAGNOSIS — E559 Vitamin D deficiency, unspecified: Secondary | ICD-10-CM

## 2017-06-15 DIAGNOSIS — E785 Hyperlipidemia, unspecified: Secondary | ICD-10-CM | POA: Diagnosis not present

## 2017-06-15 DIAGNOSIS — E8881 Metabolic syndrome: Secondary | ICD-10-CM

## 2017-06-15 NOTE — Progress Notes (Signed)
Office: [phone]  /  Fax: [phone]   HPI:   Chief Complaint: OBESITY Kathryn Huff is here to discuss her progress with her obesity treatment plan. She is on the  keep a food journal with 1300 to 1600 calories and 100+ grams of protein  and is following her eating plan approximately 80 % of the time. She states she is walking 30 minutes 3 times per week. Kathryn Huff continues to do well with weight loss. She is keeping a food journal, increasing her protein intake and making better choices. Her weight is 258 lb (117 kg) today and has had a weight loss of 6 pounds over a period of 3 weeks since her last visit. She has lost 17 lbs since starting treatment with [name].  Vitamin D deficiency [name] has a diagnosis of vitamin D deficiency. She is currently taking vit D and denies nausea, vomiting or muscle weakness.  Insulin Resistance [name] has a diagnosis of insulin resistance based on her elevated fasting insulin level >5. Although [name]'s blood glucose readings are still under good control, insulin resistance puts her at greater risk of metabolic syndrome and diabetes. She is taking metformin currently and continues to work on diet and exercise to decrease risk of diabetes. She denies polyphagia.  Hyperlipidemia [name] has hyperlipidemia, not on statin and she has been trying to improve her cholesterol levels with intensive lifestyle modification including a low saturated fat diet, exercise and weight loss. She denies any chest pain, claudication or myalgias.  ALLERGIES: No Known Allergies  MEDICATIONS: Current Outpatient Prescriptions on File Prior to Visit  Medication Sig Dispense Refill  . betamethasone dipropionate (DIPROLENE) 0.05 % cream Apply topically 2 (two) times daily. 30 g 1  . buPROPion (WELLBUTRIN SR) 150 MG 12 hr tablet Take 1 tablet (150 mg total) by mouth daily. 30 tablet 0  . docusate sodium (COLACE) 100 MG capsule Take 1 capsule (100 mg total) by mouth 2 (two) times daily.  60 capsule 0  . ergocalciferol (VITAMIN D2) 50000 units capsule Take 1 capsule (50,000 Units total) by mouth once a week. 4 capsule 0  . etonogestrel-ethinyl estradiol (NUVARING) 0.12-0.015 MG/24HR vaginal ring Place 1 each vaginally every 28 (twenty-eight) days. Insert vaginally and leave in place for 3 consecutive weeks, then remove for 1 week.    . metFORMIN (GLUCOPHAGE) 500 MG tablet Take 1 tablet (500 mg total) by mouth daily with breakfast. 30 tablet 0  . SUMAtriptan (IMITREX) 50 MG tablet One tab at start of migraine. May repeat in 2 hours if headache persists or recurs. 10 tablet 5   No current facility-administered medications on file prior to visit.     PAST MEDICAL HISTORY: Past Medical History:  Diagnosis Date  . ADD (attention deficit disorder)   . Anxiety   . Back pain   . Chest pain   . Constipation   . Depression   . Fatty liver   . Liver function abnormality    HIGH  . Migraines   . Obesity   . Vitamin D deficiency     PAST SURGICAL HISTORY: No past surgical history on file.  SOCIAL HISTORY: Social History  Substance Use Topics  . Smoking status: Former Smoker    Years: 4.00  . Smokeless tobacco: Never Used  . Alcohol use Yes     Comment: occasional    FAMILY HISTORY: Family History  Problem Relation Age of Onset  . Diabetes Sister 5       Type 1  .  Celiac disease Sister   . Diabetes Mellitus II Maternal Grandmother   . Factor V Leiden deficiency Maternal Grandmother   . Factor V Leiden deficiency Mother   . Obesity Mother   . Hyperlipidemia Father   . Sleep apnea Father   . Obesity Father   . Cancer Paternal Grandmother        breast  . Breast cancer Paternal Grandmother   . Heart Problems Sister        left ventricular non-compaction  . Diabetes Mellitus II Maternal Grandfather   . Cancer Paternal Grandfather        throat/throat and lung, smoker    ROS: Review of Systems  Constitutional: Positive for weight loss.  Cardiovascular:  Negative for chest pain and claudication.  Gastrointestinal: Negative for nausea and vomiting.  Musculoskeletal: Negative for myalgias.       Negative muscle weakness  Endo/Heme/Allergies:       Negative polyphagia    PHYSICAL EXAM: Blood pressure 103/71, pulse 77, temperature 98.3 F (36.8 C), temperature source Oral, height 5' 10"  (1.778 m), weight 258 lb (117 kg), last menstrual period 05/29/2017, SpO2 96 %. Body mass index is 37.02 kg/m. Physical Exam  Constitutional: She is oriented to person, place, and time. She appears well-developed and well-nourished.  Cardiovascular: Normal rate.   Pulmonary/Chest: Effort normal.  Musculoskeletal: Normal range of motion.  Neurological: She is oriented to person, place, and time.  Skin: Skin is warm and dry.  Psychiatric: She has a normal mood and affect. Her behavior is normal.  Vitals reviewed.   RECENT LABS AND TESTS: BMET    Component Value Date/Time   NA 137 03/01/2017 1041   K 4.6 03/01/2017 1041   CL 102 03/01/2017 1041   CO2 21 03/01/2017 1041   GLUCOSE 97 03/01/2017 1041   GLUCOSE 101 (H) 08/26/2015 0751   BUN 13 03/01/2017 1041   CREATININE 0.86 03/01/2017 1041   CREATININE 0.79 05/30/2014 0859   CALCIUM 9.1 03/01/2017 1041   GFRNONAA 94 03/01/2017 1041   GFRNONAA >89 05/30/2014 0859   GFRAA 109 03/01/2017 1041   GFRAA >89 05/30/2014 0859   Lab Results  Component Value Date   HGBA1C 5.4 03/01/2017   Lab Results  Component Value Date   INSULIN 32.5 (H) 03/01/2017   CBC    Component Value Date/Time   WBC 10.6 03/01/2017 1041   WBC 9.2 08/26/2015 0751   RBC 4.64 03/01/2017 1041   RBC 4.57 08/26/2015 0751   HGB 13.2 03/01/2017 1041   HCT 38.6 03/01/2017 1041   PLT 276.0 08/26/2015 0751   MCV 83 03/01/2017 1041   MCH 28.4 03/01/2017 1041   MCH 28.4 05/30/2014 0859   MCHC 34.2 03/01/2017 1041   MCHC 33.5 08/26/2015 0751   RDW 13.8 03/01/2017 1041   LYMPHSABS 2.3 03/01/2017 1041   MONOABS 0.7 08/26/2015  0751   EOSABS 0.1 03/01/2017 1041   BASOSABS 0.0 03/01/2017 1041   Iron/TIBC/Ferritin/ %Sat No results found for: IRON, TIBC, FERRITIN, IRONPCTSAT Lipid Panel     Component Value Date/Time   CHOL 182 03/01/2017 1041   TRIG 170 (H) 03/01/2017 1041   HDL 46 03/01/2017 1041   CHOLHDL 3 09/14/2016 0827   VLDL 34.2 09/14/2016 0827   LDLCALC 102 (H) 03/01/2017 1041   Hepatic Function Panel     Component Value Date/Time   PROT 6.6 03/01/2017 1041   ALBUMIN 3.9 03/01/2017 1041   AST 30 03/01/2017 1041   ALT 45 (H) 03/01/2017  2836   OQHUTML 46 03/01/2017 1041   BILITOT 0.4 03/01/2017 1041   BILIDIR 0.1 08/26/2015 0751   IBILI 0.3 05/30/2014 0859      Component Value Date/Time   TSH 3.200 03/01/2017 1041   TSH 3.72 08/26/2015 0751   TSH 2.976 05/30/2014 0859    ASSESSMENT AND PLAN: Hyperlipidemia, unspecified hyperlipidemia type - Plan: Comprehensive metabolic panel, Lipid Panel With LDL/HDL Ratio  Vitamin D deficiency - Plan: VITAMIN D 25 Hydroxy (Vit-D Deficiency, Fractures)  Insulin resistance - Plan: Hemoglobin A1c, Insulin, random  Class 2 obesity with serious comorbidity and body mass index (BMI) of 37.0 to 37.9 in adult, unspecified obesity type  PLAN:  Vitamin D Deficiency Margalit was informed that low vitamin D levels contributes to fatigue and are associated with obesity, breast, and colon cancer. She agrees to continue to take prescription Vit D @50 ,000 IU every week and we will check labs today and will follow up for routine testing of vitamin D, at least 2-3 times per year. She was informed of the risk of over-replacement of vitamin D and agrees to not increase her dose unless he discusses this with [name] first.  Insulin Resistance Marcellina will continue to work on weight loss, exercise, and decreasing simple carbohydrates in her diet to help decrease the risk of diabetes. We dicussed metformin including benefits and risks. She was informed that eating too many simple  carbohydrates or too many calories at one sitting increases the likelihood of GI side effects. Aryaa agrees to continue metformin and prescription was not written today. We will check labs today and Pailyn agreed to follow up with [name] as directed to monitor her progress.  Hyperlipidemia Alexias was informed of the American Heart Association Guidelines emphasizing intensive lifestyle modifications as the first line treatment for hyperlipidemia. We discussed many lifestyle modifications today in depth, and Zhania will continue to work on decreasing saturated fats such as fatty red meat, butter and many fried foods. She will also increase vegetables and lean protein in her diet and continue to work on exercise and weight loss efforts. We will check labs today and Belem agrees to follow up as directed.  Obesity Coumba is currently in the action stage of change. As such, her goal is to continue with weight loss efforts She has agreed to keep a food journal with 1400 to 1600 calories and 100+ grams of protein  Zanyah has been instructed to work up to a goal of 150 minutes of combined cardio and strengthening exercise per week for weight loss and overall health benefits. We discussed the following Behavioral Modification Strategies today: increasing lean protein intake  Yasamin has agreed to follow up with our clinic in 2 to 3 weeks. She was informed of the importance of frequent follow up visits to maximize her success with intensive lifestyle modifications for her multiple health conditions.  I, Doreene Nest, am acting as transcriptionist for Dennard Nip, MD  I have reviewed the above documentation for accuracy and completeness, and I agree with the above. -Dennard Nip, MD  OBESITY BEHAVIORAL INTERVENTION VISIT  Today's visit was # 8 out of 22.  Starting weight: 275 lbs Starting date: 03/01/17 Today's weight : 258 lbs Today's date: 06/15/2017 Total lbs lost to date: 17 (Patients must lose 7  lbs in the first 6 months to continue with counseling)   ASK: We discussed the diagnosis of obesity with Hulan Fray today and Andrell agreed to give [name] permission to discuss obesity behavioral modification therapy  today.  ASSESS: Shandiin has the diagnosis of obesity and her BMI today is 37.1 Jaquesha is in the action stage of change   ADVISE: Lorian was educated on the multiple health risks of obesity as well as the benefit of weight loss to improve her health. She was advised of the need for long term treatment and the importance of lifestyle modifications.  AGREE: Multiple dietary modification options and treatment options were discussed and  Xee agreed to keep a food journal with 1400 to 1600 calories and 100+ grams of protein daily We discussed the following Behavioral Modification Strategies today: increasing lean protein intake

## 2017-06-16 ENCOUNTER — Telehealth: Payer: Self-pay | Admitting: Family

## 2017-06-16 DIAGNOSIS — R809 Proteinuria, unspecified: Secondary | ICD-10-CM

## 2017-06-16 LAB — VITAMIN D 25 HYDROXY (VIT D DEFICIENCY, FRACTURES): VIT D 25 HYDROXY: 42 ng/mL (ref 30.0–100.0)

## 2017-06-16 LAB — COMPREHENSIVE METABOLIC PANEL
A/G RATIO: 1.4 (ref 1.2–2.2)
ALK PHOS: 93 IU/L (ref 39–117)
ALT: 17 IU/L (ref 0–32)
AST: 13 IU/L (ref 0–40)
Albumin: 3.9 g/dL (ref 3.5–5.5)
BUN/Creatinine Ratio: 16 (ref 9–23)
BUN: 13 mg/dL (ref 6–20)
Bilirubin Total: 0.3 mg/dL (ref 0.0–1.2)
CALCIUM: 9.1 mg/dL (ref 8.7–10.2)
CO2: 20 mmol/L (ref 20–29)
Chloride: 106 mmol/L (ref 96–106)
Creatinine, Ser: 0.79 mg/dL (ref 0.57–1.00)
GFR calc Af Amer: 120 mL/min/{1.73_m2} (ref >59)
GFR, EST NON AFRICAN AMERICAN: 104 mL/min/{1.73_m2} (ref >59)
Globulin, Total: 2.8 g/dL (ref 1.5–4.5)
Glucose: 97 mg/dL (ref 65–99)
POTASSIUM: 4.5 mmol/L (ref 3.5–5.2)
SODIUM: 142 mmol/L (ref 134–144)
Total Protein: 6.7 g/dL (ref 6.0–8.5)

## 2017-06-16 LAB — LIPID PANEL WITH LDL/HDL RATIO
Cholesterol, Total: 160 mg/dL (ref 100–199)
HDL: 53 mg/dL (ref >39)
LDL Calculated: 77 mg/dL (ref 0–99)
LDL/HDL RATIO: 1.5 ratio (ref 0.0–3.2)
Triglycerides: 150 mg/dL — ABNORMAL HIGH (ref 0–149)
VLDL Cholesterol Cal: 30 mg/dL (ref 5–40)

## 2017-06-16 LAB — INSULIN, RANDOM: INSULIN: 29.7 u[IU]/mL — AB (ref 2.6–24.9)

## 2017-06-16 LAB — HEMOGLOBIN A1C
Est. average glucose Bld gHb Est-mCnc: 111 mg/dL
HEMOGLOBIN A1C: 5.5 % (ref 4.8–5.6)

## 2017-06-16 MED ORDER — NITROFURANTOIN MONOHYD MACRO 100 MG PO CAPS
100.0000 mg | ORAL_CAPSULE | Freq: Two times a day (BID) | ORAL | 0 refills | Status: DC
Start: 2017-06-16 — End: 2017-08-31

## 2017-06-16 MED FILL — NITROFURANTOIN MONO-MCR 100: 100 | 5 days supply | Qty: 10 | Fill #0

## 2017-06-16 NOTE — Telephone Encounter (Signed)
Urine shows probable UTI.  Start macrodantin. Also + protein in her urine. Please repeat UA with micro in 2 weeks. Dx proteinuria.

## 2017-06-16 NOTE — Telephone Encounter (Signed)
Called patient and left a message for call back.  Also sent message to patient via [place].  Lab ordered.

## 2017-06-19 ENCOUNTER — Ambulatory Visit: Payer: Self-pay | Admitting: Family

## 2017-06-19 ENCOUNTER — Telehealth: Payer: Self-pay | Admitting: Family

## 2017-06-19 NOTE — Telephone Encounter (Signed)
No charge. 

## 2017-06-19 NOTE — Telephone Encounter (Signed)
Patient lvm 06/18/17 cancelling her 2:30pm appointment for today, stating she feels better, charge or no charge

## 2017-06-29 ENCOUNTER — Ambulatory Visit (INDEPENDENT_AMBULATORY_CARE_PROVIDER_SITE_OTHER): Payer: 59 | Admitting: Physician Assistant

## 2017-06-29 ENCOUNTER — Other Ambulatory Visit (INDEPENDENT_AMBULATORY_CARE_PROVIDER_SITE_OTHER): Payer: 59

## 2017-06-29 VITALS — BP 110/71 | HR 70 | Temp 98.3°F | Ht 70.0 in | Wt 259.0 lb

## 2017-06-29 DIAGNOSIS — Z9189 Other specified personal risk factors, not elsewhere classified: Secondary | ICD-10-CM

## 2017-06-29 DIAGNOSIS — R7303 Prediabetes: Secondary | ICD-10-CM

## 2017-06-29 DIAGNOSIS — Z6837 Body mass index (BMI) 37.0-37.9, adult: Secondary | ICD-10-CM

## 2017-06-29 DIAGNOSIS — R809 Proteinuria, unspecified: Secondary | ICD-10-CM

## 2017-06-29 DIAGNOSIS — R7989 Other specified abnormal findings of blood chemistry: Secondary | ICD-10-CM | POA: Diagnosis not present

## 2017-06-29 DIAGNOSIS — E782 Mixed hyperlipidemia: Secondary | ICD-10-CM | POA: Diagnosis not present

## 2017-06-29 DIAGNOSIS — R945 Abnormal results of liver function studies: Principal | ICD-10-CM

## 2017-06-29 DIAGNOSIS — IMO0001 Reserved for inherently not codable concepts without codable children: Secondary | ICD-10-CM

## 2017-06-29 DIAGNOSIS — E669 Obesity, unspecified: Secondary | ICD-10-CM | POA: Diagnosis not present

## 2017-06-29 LAB — URINALYSIS, ROUTINE W REFLEX MICROSCOPIC
BILIRUBIN URINE: NEGATIVE
KETONES UR: NEGATIVE
NITRITE: NEGATIVE
PH: 6 (ref 5.0–8.0)
Specific Gravity, Urine: 1.02 (ref 1.000–1.030)
TOTAL PROTEIN, URINE-UPE24: 100 — AB
URINE GLUCOSE: NEGATIVE
UROBILINOGEN UA: 1 (ref 0.0–1.0)

## 2017-06-29 NOTE — Progress Notes (Signed)
Office: [phone]  /  Fax: [phone]   HPI:   Chief Complaint: OBESITY Kathryn Huff is here to discuss her progress with her obesity treatment plan. She is on the  keep a food journal with 1400 to 1600 calories and 100+ grams of protein  and is following her eating plan approximately 85 to 90 % of the time. She states she is exercising cardio at the gym 30 minutes 3 times per week. Kathryn Huff was on vacation and has been snacking more but is making smarter choices and controlling her portions. Her weight is 259 lb (117.5 kg) today and has had a weight gain of 1 pound over a period of 2 weeks since her last visit. She has lost 16 lbs since starting treatment with [name].  Elevated ALT Kathryn Huff's repeat ALT is normal at 17. She denies abdominal pain or jaundice and has never been told of any liver problems in the past. She denies excessive alcohol intake.  Mixed Hyperlipidemia Kathryn Huff has hyperlipidemia and is not on statin. LDL is down to 77 and triglycerides are at 150 and she has been trying to improve her cholesterol levels with intensive lifestyle modification including a low saturated fat diet, exercise and weight loss. She denies any chest pain, claudication or myalgias.  Pre-Diabetes Kathryn Huff has a diagnosis of pre-diabetes based on her elevated Hgb A1c at 5.7 and was informed this puts her at greater risk of developing diabetes. She is taking metformin currently and continues to work on diet and exercise to decrease risk of diabetes. She denies nausea or hypoglycemia.  At risk for diabetes Kathryn Huff is at higher than average risk for developing diabetes due to her obesity and pre-diabetes. She currently denies polyuria or polydipsia.   ALLERGIES: No Known Allergies  MEDICATIONS: Current Outpatient Prescriptions on File Prior to Visit  Medication Sig Dispense Refill   betamethasone dipropionate (DIPROLENE) 0.05 % cream Apply topically 2 (two) times daily. 30 g 1   buPROPion (WELLBUTRIN SR)  150 MG 12 hr tablet Take 1 tablet (150 mg total) by mouth daily. 30 tablet 0   docusate sodium (COLACE) 100 MG capsule Take 1 capsule (100 mg total) by mouth 2 (two) times daily. 60 capsule 0   ergocalciferol (VITAMIN D2) 50000 units capsule Take 1 capsule (50,000 Units total) by mouth once a week. 4 capsule 0   etonogestrel-ethinyl estradiol (NUVARING) 0.12-0.015 MG/24HR vaginal ring Place 1 each vaginally every 28 (twenty-eight) days. Insert vaginally and leave in place for 3 consecutive weeks, then remove for 1 week.     metFORMIN (GLUCOPHAGE) 500 MG tablet Take 1 tablet (500 mg total) by mouth daily with breakfast. 30 tablet 0   nitrofurantoin, macrocrystal-monohydrate, (MACROBID) 100 MG capsule Take 1 capsule (100 mg total) by mouth 2 (two) times daily. 10 capsule 0   SUMAtriptan (IMITREX) 50 MG tablet One tab at start of migraine. May repeat in 2 hours if headache persists or recurs. 10 tablet 5   No current facility-administered medications on file prior to visit.     PAST MEDICAL HISTORY: Past Medical History:  Diagnosis Date   ADD (attention deficit disorder)    Anxiety    Back pain    Chest pain    Constipation    Depression    Fatty liver    Liver function abnormality    HIGH   Migraines    Obesity    Vitamin D deficiency     PAST SURGICAL HISTORY: No past surgical history on file.  SOCIAL HISTORY: Social History  Substance Use Topics   Smoking status: Former Smoker    Years: 4.00   Smokeless tobacco: Never Used   Alcohol use Yes     Comment: occasional    FAMILY HISTORY: Family History  Problem Relation Age of Onset   Diabetes Sister 5       Type 1   Celiac disease Sister    Diabetes Mellitus II Maternal Grandmother    Factor V Leiden deficiency Maternal Grandmother    Factor V Leiden deficiency Mother    Obesity Mother    Hyperlipidemia Father    Sleep apnea Father    Obesity Father    Cancer Paternal Grandmother         breast   Breast cancer Paternal Grandmother    Heart Problems Sister        left ventricular non-compaction   Diabetes Mellitus II Maternal Grandfather    Cancer Paternal Grandfather        throat/throat and lung, smoker    ROS: Review of Systems  Constitutional: Negative for weight loss.  Cardiovascular: Negative for chest pain and claudication.  Gastrointestinal: Negative for abdominal pain and nausea.  Genitourinary: Negative for frequency.  Musculoskeletal: Negative for myalgias.  Endo/Heme/Allergies: Negative for polydipsia.       Negative hypoglycemia    PHYSICAL EXAM: Blood pressure 110/71, pulse 70, temperature 98.3 F (36.8 C), temperature source Oral, height 5' 10"  (1.778 m), weight 259 lb (117.5 kg), SpO2 98 %. Body mass index is 37.16 kg/m. Physical Exam  Constitutional: She is oriented to person, place, and time. She appears well-developed and well-nourished.  Cardiovascular: Normal rate.   Pulmonary/Chest: Effort normal.  Musculoskeletal: Normal range of motion.  Neurological: She is oriented to person, place, and time.  Skin: Skin is warm and dry.  Psychiatric: She has a normal mood and affect. Her behavior is normal.  Vitals reviewed.   RECENT LABS AND TESTS: BMET    Component Value Date/Time   NA 142 06/15/2017 0934   K 4.5 06/15/2017 0934   CL 106 06/15/2017 0934   CO2 20 06/15/2017 0934   GLUCOSE 97 06/15/2017 0934   GLUCOSE 101 (H) 08/26/2015 0751   BUN 13 06/15/2017 0934   CREATININE 0.79 06/15/2017 0934   CREATININE 0.79 05/30/2014 0859   CALCIUM 9.1 06/15/2017 0934   GFRNONAA 104 06/15/2017 0934   GFRNONAA >89 05/30/2014 0859   GFRAA 120 06/15/2017 0934   GFRAA >89 05/30/2014 0859   Lab Results  Component Value Date   HGBA1C 5.5 06/15/2017   HGBA1C 5.4 03/01/2017   Lab Results  Component Value Date   INSULIN 29.7 (H) 06/15/2017   INSULIN 32.5 (H) 03/01/2017   CBC    Component Value Date/Time   WBC 10.6 03/01/2017 1041    WBC 9.2 08/26/2015 0751   RBC 4.64 03/01/2017 1041   RBC 4.57 08/26/2015 0751   HGB 13.2 03/01/2017 1041   HCT 38.6 03/01/2017 1041   PLT 276.0 08/26/2015 0751   MCV 83 03/01/2017 1041   MCH 28.4 03/01/2017 1041   MCH 28.4 05/30/2014 0859   MCHC 34.2 03/01/2017 1041   MCHC 33.5 08/26/2015 0751   RDW 13.8 03/01/2017 1041   LYMPHSABS 2.3 03/01/2017 1041   MONOABS 0.7 08/26/2015 0751   EOSABS 0.1 03/01/2017 1041   BASOSABS 0.0 03/01/2017 1041   Iron/TIBC/Ferritin/ %Sat No results found for: IRON, TIBC, FERRITIN, IRONPCTSAT Lipid Panel     Component Value Date/Time   CHOL  160 06/15/2017 0934   TRIG 150 (H) 06/15/2017 0934   HDL 53 06/15/2017 0934   CHOLHDL 3 09/14/2016 0827   VLDL 34.2 09/14/2016 0827   LDLCALC 77 06/15/2017 0934   Hepatic Function Panel     Component Value Date/Time   PROT 6.7 06/15/2017 0934   ALBUMIN 3.9 06/15/2017 0934   AST 13 06/15/2017 0934   ALT 17 06/15/2017 0934   ALKPHOS 93 06/15/2017 0934   BILITOT 0.3 06/15/2017 0934   BILIDIR 0.1 08/26/2015 0751   IBILI 0.3 05/30/2014 0859      Component Value Date/Time   TSH 3.200 03/01/2017 1041   TSH 3.72 08/26/2015 0751   TSH 2.976 05/30/2014 0859    ASSESSMENT AND PLAN: Elevated liver function tests  Mixed hyperlipidemia  Prediabetes  At risk for diabetes mellitus  Class 2 obesity with serious comorbidity and body mass index (BMI) of 37.0 to 37.9 in adult, unspecified obesity type  PLAN:  Elevated ALT We discussed the 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 Kathryn Huff was educated on her risk of developing NASH or even liver failure and th only proven treatment for NAFLD was weight loss. Kathryn Huff agreed to continue with her weight loss efforts with healthier diet and exercise as an essential part of her treatment plan.  Mixed Hyperlipidemia Kathryn Huff was informed of the American Heart Association Guidelines emphasizing intensive  lifestyle modifications as the first line treatment for hyperlipidemia. We discussed many lifestyle modifications today in depth, and Kathryn Huff will continue to work on decreasing saturated fats such as fatty red meat, butter and many fried foods. She will also increase vegetables and lean protein in her diet and continue to work on exercise and weight loss efforts.  Pre-Diabetes Kathryn Huff will continue to work on weight loss, exercise, and decreasing simple carbohydrates in her diet to help decrease the risk of diabetes. We dicussed metformin including benefits and risks. She was informed that eating too many simple carbohydrates or too many calories at one sitting increases the likelihood of GI side effects. Kathryn Huff agrees to continue metformin for now and a prescription was not written today. Kathryn Huff agreed to follow up with [name] as directed to monitor her progress.  Diabetes risk counselling Kathryn Huff was given extended (at least 15 minutes) diabetes prevention counseling today. She is 26 y.o. female and has risk factors for diabetes including obesity and pre-diabetes. We discussed intensive lifestyle modifications today with an emphasis on weight loss as well as increasing exercise and decreasing simple carbohydrates in her diet.  Obesity Alondria is currently in the action stage of change. As such, her goal is to continue with weight loss efforts She has agreed to keep a food journal with 1400 to 1600 calories and 100+ grams of protein  Koreena has been instructed to work up to a goal of 150 minutes of combined cardio and strengthening exercise per week for weight loss and overall health benefits. We discussed the following Behavioral Modification Strategies today: increasing lean protein intake and meal planning & cooking strategies  Falynn has agreed to follow up with our clinic in 2 weeks. She was informed of the importance of frequent follow up visits to maximize her success with intensive lifestyle  modifications for her multiple health conditions.  I, Kathryn Huff, am acting as transcriptionist for Kathryn Duverney, PA-C  I have reviewed the above documentation for accuracy and completeness, and I agree with the above. -Kathryn Nip, MD  OBESITY BEHAVIORAL INTERVENTION VISIT  Today's visit was # 9 out of 22.  Starting weight: 275 lbs Starting date: 03/01/17 Today's weight : 259 lbs Today's date: 06/29/2017 Total lbs lost to date: 46 (Patients must lose 7 lbs in the first 6 months to continue with counseling)   ASK: We discussed the diagnosis of obesity with Hulan Fray today and Bailie agreed to give [name] permission to discuss obesity behavioral modification therapy today.  ASSESS: Rebbie has the diagnosis of obesity and her BMI today is 37.2 Stephine is in the action stage of change   ADVISE: Kimala was educated on the multiple health risks of obesity as well as the benefit of weight loss to improve her health. She was advised of the need for long term treatment and the importance of lifestyle modifications.  AGREE: Multiple dietary modification options and treatment options were discussed and  Eleena agreed to keep a food journal with 1400 to 1600 calories and 100+ grams of protein  We discussed the following Behavioral Modification Strategies today: increasing lean protein intake and meal planning & cooking strategies

## 2017-06-30 ENCOUNTER — Encounter: Payer: Self-pay | Admitting: Family

## 2017-06-30 DIAGNOSIS — R3129 Other microscopic hematuria: Secondary | ICD-10-CM

## 2017-07-02 NOTE — Telephone Encounter (Signed)
Lets repeat UA with micro and culture in 2 weeks, dx hematuria.

## 2017-07-03 NOTE — Telephone Encounter (Signed)
Spoke with pt via phone and notified her of need to repeat U/A and culture in 2 weeks. Lab appt scheduled for 07/17/17 at 3pm and future order entered.

## 2017-07-07 ENCOUNTER — Encounter: Payer: Self-pay | Admitting: Family

## 2017-07-08 MED ORDER — FLUCONAZOLE 150 MG PO TABS: 150.0000 mg | ORAL_TABLET | Freq: Once | ORAL | 0 refills | Status: AC

## 2017-07-10 MED FILL — FLUCONAZOLE 150 MG TABLET: 150 | 1 days supply | Qty: 1 | Fill #0

## 2017-07-13 ENCOUNTER — Ambulatory Visit (INDEPENDENT_AMBULATORY_CARE_PROVIDER_SITE_OTHER): Payer: 59 | Admitting: Physician Assistant

## 2017-07-13 VITALS — BP 113/79 | HR 76 | Temp 98.1°F | Ht 70.0 in | Wt 260.0 lb

## 2017-07-13 DIAGNOSIS — F3289 Other specified depressive episodes: Secondary | ICD-10-CM

## 2017-07-13 DIAGNOSIS — IMO0001 Reserved for inherently not codable concepts without codable children: Secondary | ICD-10-CM

## 2017-07-13 DIAGNOSIS — E8881 Metabolic syndrome: Secondary | ICD-10-CM | POA: Diagnosis not present

## 2017-07-13 DIAGNOSIS — Z6837 Body mass index (BMI) 37.0-37.9, adult: Secondary | ICD-10-CM

## 2017-07-13 DIAGNOSIS — Z9189 Other specified personal risk factors, not elsewhere classified: Secondary | ICD-10-CM | POA: Diagnosis not present

## 2017-07-13 DIAGNOSIS — E559 Vitamin D deficiency, unspecified: Secondary | ICD-10-CM

## 2017-07-13 DIAGNOSIS — E669 Obesity, unspecified: Secondary | ICD-10-CM

## 2017-07-13 MED ORDER — METFORMIN HCL 500 MG PO TABS: 500.0000 mg | ORAL_TABLET | Freq: Two times a day (BID) | ORAL | 0 refills | Status: DC

## 2017-07-13 MED ORDER — ERGOCALCIFEROL 1.25 MG (50000 UT) PO CAPS: 50000.0000 [IU] | ORAL_CAPSULE | ORAL | 0 refills | Status: DC

## 2017-07-13 NOTE — Progress Notes (Signed)
Office: [phone]  /  Fax: [phone]   HPI:   Chief Complaint: OBESITY Kathryn Huff is here to discuss her progress with her obesity treatment plan. She is on the  keep a food journal with 1600 calories and 100+g protein  and is following her eating plan approximately 75 % of the time. She states she is exercising at the gym for 30 minutes 3 times per week. Kathryn Huff struggled with weight loss. She has been on vacation and had a hard time planning ahead and following the plan. She is ready to get back on track.  Her weight is 260 lb (117.9 kg) today and has gained 1 pound since her last visit. She has lost 15 lbs since starting treatment with [name].  Insulin Resistance Kathryn Huff has a diagnosis of insulin resistance based on her elevated fasting insulin level >5. Although Kathryn Huff's blood glucose readings are still under good control, insulin resistance puts her at greater risk of metabolic syndrome and diabetes. She is taking metformin currently and continues to work on diet and exercise to decrease risk of diabetes.  Vitamin D deficiency Kathryn Huff has a diagnosis of vitamin D deficiency. She is currently taking vit D and denies nausea, vomiting or muscle weakness.  At risk for diabetes Kathryn Huff is at higher than averagerisk for developing diabetes due to her obesity. She currently denies polyuria or polydipsia.    ALLERGIES: No Known Allergies  MEDICATIONS: Current Outpatient Prescriptions on File Prior to Visit  Medication Sig Dispense Refill  . betamethasone dipropionate (DIPROLENE) 0.05 % cream Apply topically 2 (two) times daily. 30 g 1  . buPROPion (WELLBUTRIN SR) 150 MG 12 hr tablet Take 1 tablet (150 mg total) by mouth daily. 30 tablet 0  . docusate sodium (COLACE) 100 MG capsule Take 1 capsule (100 mg total) by mouth 2 (two) times daily. 60 capsule 0  . etonogestrel-ethinyl estradiol (NUVARING) 0.12-0.015 MG/24HR vaginal ring Place 1 each vaginally every 28 (twenty-eight) days. Insert  vaginally and leave in place for 3 consecutive weeks, then remove for 1 week.    . nitrofurantoin, macrocrystal-monohydrate, (MACROBID) 100 MG capsule Take 1 capsule (100 mg total) by mouth 2 (two) times daily. 10 capsule 0  . SUMAtriptan (IMITREX) 50 MG tablet One tab at start of migraine. May repeat in 2 hours if headache persists or recurs. 10 tablet 5   No current facility-administered medications on file prior to visit.     PAST MEDICAL HISTORY: Past Medical History:  Diagnosis Date  . ADD (attention deficit disorder)   . Anxiety   . Back pain   . Chest pain   . Constipation   . Depression   . Fatty liver   . Liver function abnormality    HIGH  . Migraines   . Obesity   . Vitamin D deficiency     PAST SURGICAL HISTORY: No past surgical history on file.  SOCIAL HISTORY: Social History  Substance Use Topics  . Smoking status: Former Smoker    Years: 4.00  . Smokeless tobacco: Never Used  . Alcohol use Yes     Comment: occasional    FAMILY HISTORY: Family History  Problem Relation Age of Onset  . Diabetes Sister 5       Type 1  . Celiac disease Sister   . Diabetes Mellitus II Maternal Grandmother   . Factor V Leiden deficiency Maternal Grandmother   . Factor V Leiden deficiency Mother   . Obesity Mother   . Hyperlipidemia Father   .  Sleep apnea Father   . Obesity Father   . Cancer Paternal Grandmother        breast  . Breast cancer Paternal Grandmother   . Heart Problems Sister        left ventricular non-compaction  . Diabetes Mellitus II Maternal Grandfather   . Cancer Paternal Grandfather        throat/throat and lung, smoker    ROS: Review of Systems  Gastrointestinal: Negative for nausea and vomiting.  Musculoskeletal:       Negative muscle weakness  Endo/Heme/Allergies:       Negative polyphagia  Psychiatric/Behavioral: Positive for depression. Negative for suicidal ideas.    PHYSICAL EXAM: Blood pressure 113/79, pulse 76, temperature  98.1 F (36.7 C), temperature source Oral, height 5' 10"  (1.778 m), weight 260 lb (117.9 kg), SpO2 97 %. Body mass index is 37.31 kg/m. Physical Exam  Constitutional: She is oriented to person, place, and time. She appears well-developed and well-nourished.  Cardiovascular: Normal rate.   Pulmonary/Chest: Effort normal.  Musculoskeletal: Normal range of motion.  Neurological: She is alert and oriented to person, place, and time.  Skin: Skin is warm and dry.  Psychiatric: She has a normal mood and affect.    RECENT LABS AND TESTS: BMET    Component Value Date/Time   NA 142 06/15/2017 0934   K 4.5 06/15/2017 0934   CL 106 06/15/2017 0934   CO2 20 06/15/2017 0934   GLUCOSE 97 06/15/2017 0934   GLUCOSE 101 (H) 08/26/2015 0751   BUN 13 06/15/2017 0934   CREATININE 0.79 06/15/2017 0934   CREATININE 0.79 05/30/2014 0859   CALCIUM 9.1 06/15/2017 0934   GFRNONAA 104 06/15/2017 0934   GFRNONAA >89 05/30/2014 0859   GFRAA 120 06/15/2017 0934   GFRAA >89 05/30/2014 0859   Lab Results  Component Value Date   HGBA1C 5.5 06/15/2017   HGBA1C 5.4 03/01/2017   Lab Results  Component Value Date   INSULIN 29.7 (H) 06/15/2017   INSULIN 32.5 (H) 03/01/2017   CBC    Component Value Date/Time   WBC 10.6 03/01/2017 1041   WBC 9.2 08/26/2015 0751   RBC 4.64 03/01/2017 1041   RBC 4.57 08/26/2015 0751   HGB 13.2 03/01/2017 1041   HCT 38.6 03/01/2017 1041   PLT 276.0 08/26/2015 0751   MCV 83 03/01/2017 1041   MCH 28.4 03/01/2017 1041   MCH 28.4 05/30/2014 0859   MCHC 34.2 03/01/2017 1041   MCHC 33.5 08/26/2015 0751   RDW 13.8 03/01/2017 1041   LYMPHSABS 2.3 03/01/2017 1041   MONOABS 0.7 08/26/2015 0751   EOSABS 0.1 03/01/2017 1041   BASOSABS 0.0 03/01/2017 1041   Iron/TIBC/Ferritin/ %Sat No results found for: IRON, TIBC, FERRITIN, IRONPCTSAT Lipid Panel     Component Value Date/Time   CHOL 160 06/15/2017 0934   TRIG 150 (H) 06/15/2017 0934   HDL 53 06/15/2017 0934   CHOLHDL  3 09/14/2016 0827   VLDL 34.2 09/14/2016 0827   LDLCALC 77 06/15/2017 0934   Hepatic Function Panel     Component Value Date/Time   PROT 6.7 06/15/2017 0934   ALBUMIN 3.9 06/15/2017 0934   AST 13 06/15/2017 0934   ALT 17 06/15/2017 0934   ALKPHOS 93 06/15/2017 0934   BILITOT 0.3 06/15/2017 0934   BILIDIR 0.1 08/26/2015 0751   IBILI 0.3 05/30/2014 0859      Component Value Date/Time   TSH 3.200 03/01/2017 1041   TSH 3.72 08/26/2015 0751   TSH  2.976 05/30/2014 0859    ASSESSMENT AND PLAN: Insulin resistance - Plan: metFORMIN (GLUCOPHAGE) 500 MG tablet  Vitamin D deficiency - Plan: ergocalciferol (VITAMIN D2) 50000 units capsule  Other depression  At risk for diabetes mellitus  Class 2 obesity with serious comorbidity and body mass index (BMI) of 37.0 to 37.9 in adult, unspecified obesity type  PLAN:  Insulin Resistance Janet will continue to work on weight loss, exercise, and decreasing simple carbohydrates in her diet to help decrease the risk of diabetes. We dicussed metformin including benefits and risks. She was informed that eating too many simple carbohydrates or too many calories at one sitting increases the likelihood of GI side effects. Kathryn Huff requested metformin for now and prescription was written today for Metformin 500 mg twice daily. Kathryn Huff agreed to follow up with [name] as directed to monitor her progress.  Vitamin D Deficiency Kathryn Huff was informed that low vitamin D levels contributes to fatigue and are associated with obesity, breast, and colon cancer. She agrees to continue to take prescription Vit D @50 ,000 IU every week, refill written today, and will follow up for routine testing of vitamin D, at least 2-3 times per year. She was informed of the risk of over-replacement of vitamin D and agrees to not increase her dose unless he discusses this with [name] first.  Depression with Emotional Eating Behaviors We discussed behavior modification techniques today to  help Kathryn Huff deal with her emotional eating and depression. She has agreed to take Wellbutrin SR 150 mg qd, refill written today, and agreed to follow up as directed.  Diabetes risk counselling Kathryn Huff was given extended (15 minutes) diabetes prevention counseling today. She is 26 y.o. female and has risk factors for diabetes including obesity. We discussed intensive lifestyle modifications today with an emphasis on weight loss as well as increasing exercise and decreasing simple carbohydrates in her diet.   Obesity Kathryn Huff is currently in the action stage of change. As such, her goal is to continue with weight loss efforts She has agreed to follow a lower carbohydrate, vegetable and lean protein rich diet plan Kathryn Huff has been instructed to work up to a goal of 150 minutes of combined cardio and strengthening exercise per week for weight loss and overall health benefits. We discussed the following Behavioral Modification Stratagies today: increasing lean protein intake and work on meal planning and easy cooking plans and keeping healthy foods in the home.   Kathryn Huff has agreed to follow up with our clinic in 2 weeks. She was informed of the importance of frequent follow up visits to maximize her success with intensive lifestyle modifications for her multiple health conditions.   Office: [phone]  /  Fax: [phone]  OBESITY BEHAVIORAL INTERVENTION VISIT  Today's visit was # 10 out of 22.  Starting weight: 275  Starting date: 03/01/2017 Today's weight : Weight: 260 lb (117.9 kg)  Today's date: 07/13/2017 Total lbs lost to date: 15 (Patients must lose 7 lbs in the first 6 months to continue with counseling)   ASK: We discussed the diagnosis of obesity with Kathryn Huff today and Kathryn Huff agreed to give [name] permission to discuss obesity behavioral modification therapy today.  ASSESS: Kathryn Huff has the diagnosis of obesity and her BMI today is 37.4 Kathryn Huff is in the action stage of change    ADVISE: Kathryn Huff was educated on the multiple health risks of obesity as well as the benefit of weight loss to improve her health. She was advised of the need for long  term treatment and the importance of lifestyle modifications.  AGREE: Multiple dietary modification options and treatment options were discussed and  Kathryn Huff agreed to follow a lower carbohydrate, vegetable and lean protein rich diet plan We discussed the following Behavioral Modification Stratagies today: increasing lean protein intake and work on meal planning and easy cooking plans and keeping healthy foods in the house.    I have reviewed the above documentation for accuracy and completeness, and I agree with the above. -Lacy Duverney, PA-C  I have reviewed the above note and agree with the plan. -Dennard Nip, MD

## 2017-07-17 ENCOUNTER — Other Ambulatory Visit: Payer: 59

## 2017-07-17 DIAGNOSIS — R3129 Other microscopic hematuria: Secondary | ICD-10-CM

## 2017-07-18 LAB — URINE CULTURE

## 2017-07-20 NOTE — Addendum Note (Signed)
Addended by: Caffie Pinto on: 07/20/2017 02:49 PM   Modules accepted: Orders

## 2017-07-27 ENCOUNTER — Ambulatory Visit (INDEPENDENT_AMBULATORY_CARE_PROVIDER_SITE_OTHER): Payer: 59 | Admitting: Physician Assistant

## 2017-07-27 VITALS — BP 118/78 | HR 67 | Temp 97.9°F | Ht 70.0 in | Wt 258.0 lb

## 2017-07-27 DIAGNOSIS — Z6837 Body mass index (BMI) 37.0-37.9, adult: Secondary | ICD-10-CM

## 2017-07-27 DIAGNOSIS — E8881 Metabolic syndrome: Secondary | ICD-10-CM

## 2017-07-27 DIAGNOSIS — Z9189 Other specified personal risk factors, not elsewhere classified: Secondary | ICD-10-CM

## 2017-07-27 DIAGNOSIS — E669 Obesity, unspecified: Secondary | ICD-10-CM

## 2017-07-27 DIAGNOSIS — F3289 Other specified depressive episodes: Secondary | ICD-10-CM

## 2017-07-27 DIAGNOSIS — F32A Depression, unspecified: Secondary | ICD-10-CM | POA: Insufficient documentation

## 2017-07-27 DIAGNOSIS — IMO0001 Reserved for inherently not codable concepts without codable children: Secondary | ICD-10-CM

## 2017-07-27 DIAGNOSIS — F329 Major depressive disorder, single episode, unspecified: Secondary | ICD-10-CM | POA: Insufficient documentation

## 2017-07-27 MED ORDER — BUPROPION HCL ER (SR) 200 MG PO TB12: 200.0000 mg | ORAL_TABLET | Freq: Every morning | ORAL | 0 refills | Status: DC

## 2017-07-27 NOTE — Progress Notes (Signed)
Office: [phone]  /  Fax: [phone]   HPI:   Chief Complaint: OBESITY Kathryn Huff is here to discuss her progress with her obesity treatment plan. She is on the lower carbohydrate, vegetable and lean protein rich diet plan and is following her eating plan approximately 90 % of the time. She states she is walking the dog for 30 minutes 2 times per week. Kathryn Huff tolerated the low carbohydrate plan well. Her weight is 258 lb (117 kg) today and has had a weight loss of 2 pounds over a period of 2 weeks since her last visit. She has lost 17 lbs since starting treatment with [name].  Insulin Resistance Kathryn Huff has a diagnosis of insulin resistance based on her elevated fasting insulin level >5. Although Kathryn Huff's blood glucose readings are still under good control, insulin resistance puts her at greater risk of metabolic syndrome and diabetes. She is taking metformin currently and continues to work on diet and exercise to decrease risk of diabetes. She denies polyphagia.  At risk for diabetes Kathryn Huff is at higher than average risk for developing diabetes due to her obesity and insulin resistance. She currently denies polyuria or polydipsia.  Depression with emotional eating behaviors Kathryn Huff is struggling with emotional eating and using food for comfort to the extent that it is negatively impacting her health. She often snacks when she is not hungry. Kathryn Huff sometimes feels she is out of control and then feels guilty that she made poor food choices. She has been working on behavior modification techniques to help reduce her emotional eating and has been somewhat successful. She shows no sign of suicidal or homicidal ideations and denies insomnia.  Depression screen Kathryn Huff  Decreased Interest 1 0  Down, Depressed, Hopeless 2 0  PHQ - 2 Score 3 0  Altered sleeping 1 -  Tired, decreased energy 3 -  Change in appetite 2 -  Feeling bad or failure about yourself  3 -  Trouble  concentrating 3 -  Moving slowly or fidgety/restless 1 -  Suicidal thoughts 1 -  PHQ-9 Score 17 -      ALLERGIES: No Known Allergies  MEDICATIONS: Current Outpatient Prescriptions on File Prior to Visit  Medication Sig Dispense Refill  . betamethasone dipropionate (DIPROLENE) 0.05 % cream Apply topically 2 (two) times daily. 30 g 1  . docusate sodium (COLACE) 100 MG capsule Take 1 capsule (100 mg total) by mouth 2 (two) times daily. 60 capsule 0  . ergocalciferol (VITAMIN D2) 50000 units capsule Take 1 capsule (50,000 Units total) by mouth once a week. 4 capsule 0  . etonogestrel-ethinyl estradiol (NUVARING) 0.12-0.015 MG/24HR vaginal ring Place 1 each vaginally every 28 (twenty-eight) days. Insert vaginally and leave in place for 3 consecutive weeks, then remove for 1 week.    . metFORMIN (GLUCOPHAGE) 500 MG tablet Take 1 tablet (500 mg total) by mouth 2 (two) times daily with a meal. 60 tablet 0  . nitrofurantoin, macrocrystal-monohydrate, (MACROBID) 100 MG capsule Take 1 capsule (100 mg total) by mouth 2 (two) times daily. 10 capsule 0  . SUMAtriptan (IMITREX) 50 MG tablet One tab at start of migraine. May repeat in 2 hours if headache persists or recurs. 10 tablet 5   No current facility-administered medications on file prior to visit.     PAST MEDICAL HISTORY: Past Medical History:  Diagnosis Date  . ADD (attention deficit disorder)   . Anxiety   . Back pain   . Chest pain   .  Constipation   . Depression   . Fatty liver   . Liver function abnormality    HIGH  . Migraines   . Obesity   . Vitamin D deficiency     PAST SURGICAL HISTORY: No past surgical history on file.  SOCIAL HISTORY: Social History  Substance Use Topics  . Smoking status: Former Smoker    Years: 4.00  . Smokeless tobacco: Never Used  . Alcohol use Yes     Comment: occasional    FAMILY HISTORY: Family History  Problem Relation Age of Onset  . Diabetes Sister 5       Type 1  . Celiac  disease Sister   . Diabetes Mellitus II Maternal Grandmother   . Factor V Leiden deficiency Maternal Grandmother   . Factor V Leiden deficiency Mother   . Obesity Mother   . Hyperlipidemia Father   . Sleep apnea Father   . Obesity Father   . Cancer Paternal Grandmother        breast  . Breast cancer Paternal Grandmother   . Heart Problems Sister        left ventricular non-compaction  . Diabetes Mellitus II Maternal Grandfather   . Cancer Paternal Grandfather        throat/throat and lung, smoker    ROS: Review of Systems  Constitutional: Positive for weight loss.  Genitourinary: Negative for frequency.  Endo/Heme/Allergies: Negative for polydipsia.  Psychiatric/Behavioral: Positive for depression. Negative for suicidal ideas. The patient does not have insomnia.     PHYSICAL EXAM: Blood pressure 118/78, pulse 67, temperature 97.9 F (36.6 C), temperature source Oral, height 5' 10"  (1.778 m), weight 258 lb (117 kg), SpO2 98 %. Body mass index is 37.02 kg/m. Physical Exam  Constitutional: She is oriented to person, place, and time. She appears well-developed and well-nourished.  Cardiovascular: Normal rate.   Pulmonary/Chest: Effort normal.  Musculoskeletal: Normal range of motion.  Neurological: She is oriented to person, place, and time.  Skin: Skin is warm and dry.  Psychiatric: She has a normal mood and affect. Her behavior is normal.  Vitals reviewed.   RECENT LABS AND TESTS: BMET    Component Value Date/Time   NA 142 06/15/2017 0934   K 4.5 06/15/2017 0934   CL 106 06/15/2017 0934   CO2 20 06/15/2017 0934   GLUCOSE 97 06/15/2017 0934   GLUCOSE 101 (H) 08/26/2015 0751   BUN 13 06/15/2017 0934   CREATININE 0.79 06/15/2017 0934   CREATININE 0.79 05/30/2014 0859   CALCIUM 9.1 06/15/2017 0934   GFRNONAA 104 06/15/2017 0934   GFRNONAA >89 05/30/2014 0859   GFRAA 120 06/15/2017 0934   GFRAA >89 05/30/2014 0859   Lab Results  Component Value Date   HGBA1C  5.5 06/15/2017   HGBA1C 5.4 03/01/2017   Lab Results  Component Value Date   INSULIN 29.7 (H) 06/15/2017   INSULIN 32.5 (H) 03/01/2017   CBC    Component Value Date/Time   WBC 10.6 03/01/2017 1041   WBC 9.2 08/26/2015 0751   RBC 4.64 03/01/2017 1041   RBC 4.57 08/26/2015 0751   HGB 13.2 03/01/2017 1041   HCT 38.6 03/01/2017 1041   PLT 276.0 08/26/2015 0751   MCV 83 03/01/2017 1041   MCH 28.4 03/01/2017 1041   MCH 28.4 05/30/2014 0859   MCHC 34.2 03/01/2017 1041   MCHC 33.5 08/26/2015 0751   RDW 13.8 03/01/2017 1041   LYMPHSABS 2.3 03/01/2017 1041   MONOABS 0.7 08/26/2015 0751  EOSABS 0.1 03/01/2017 1041   BASOSABS 0.0 03/01/2017 1041   Iron/TIBC/Ferritin/ %Sat No results found for: IRON, TIBC, FERRITIN, IRONPCTSAT Lipid Panel     Component Value Date/Time   CHOL 160 06/15/2017 0934   TRIG 150 (H) 06/15/2017 0934   HDL 53 06/15/2017 0934   CHOLHDL 3 09/14/2016 0827   VLDL 34.2 09/14/2016 0827   LDLCALC 77 06/15/2017 0934   Hepatic Function Panel     Component Value Date/Time   PROT 6.7 06/15/2017 0934   ALBUMIN 3.9 06/15/2017 0934   AST 13 06/15/2017 0934   ALT 17 06/15/2017 0934   ALKPHOS 93 06/15/2017 0934   BILITOT 0.3 06/15/2017 0934   BILIDIR 0.1 08/26/2015 0751   IBILI 0.3 05/30/2014 0859      Component Value Date/Time   TSH 3.200 03/01/2017 1041   TSH 3.72 08/26/2015 0751   TSH 2.976 05/30/2014 0859    ASSESSMENT AND PLAN: Insulin resistance  Other depression - Plan: buPROPion (WELLBUTRIN SR) 200 MG 12 hr tablet  At risk for diabetes mellitus  Class 2 obesity with serious comorbidity and body mass index (BMI) of 37.0 to 37.9 in adult, unspecified obesity type  PLAN:  Insulin Resistance Kenlei will continue to work on weight loss, exercise, and decreasing simple carbohydrates in her diet to help decrease the risk of diabetes. We dicussed metformin including benefits and risks. She was informed that eating too many simple carbohydrates or  too many calories at one sitting increases the likelihood of GI side effects. Leslie agrees to continue metformin for now and prescription was not written today. Braylin agreed to follow up with [name] as directed to monitor her progress.  Diabetes risk counselling Rayann was given extended (15 minutes) diabetes prevention counseling today. She is 26 y.o. female and has risk factors for diabetes including obesity and insulin resistance. We discussed intensive lifestyle modifications today with an emphasis on weight loss as well as increasing exercise and decreasing simple carbohydrates in her diet.  Depression with Emotional Eating Behaviors We discussed behavior modification techniques today to help Larua deal with her emotional eating and depression. She has agreed to increase Wellbutrin SR to 200 mg qd #30 with no refills and she agreed to follow up as directed.  Obesity Maite is currently in the action stage of change. As such, her goal is to continue with weight loss efforts She has agreed to keep a food journal with 1600 calories and 100+ grams of protein daily Keasha has been instructed to work up to a goal of 150 minutes of combined cardio and strengthening exercise per week for weight loss and overall health benefits. We discussed the following Behavioral Modification Strategies today: increasing lean protein intake  Rosibel has agreed to follow up with our clinic in 2 weeks. She was informed of the importance of frequent follow up visits to maximize her success with intensive lifestyle modifications for her multiple health conditions.  I, Doreene Nest, am acting as transcriptionist for Lacy Duverney, PA-C  I have reviewed the above documentation for accuracy and completeness, and I agree with the above. -Lacy Duverney, PA-C  I have reviewed the above note and agree with the plan. -Dennard Nip, MD   OBESITY BEHAVIORAL INTERVENTION VISIT  Today's visit was # 11 out of 22.  Starting  weight: 275 lbs Starting date: 03/01/17 Today's weight : 258 lbs Today's date: 07/27/2017 Total lbs lost to date: 35 (Patients must lose 7 lbs in the first 6 months to continue with  counseling)   ASK: We discussed the diagnosis of obesity with Hulan Fray today and Snigdha agreed to give [name] permission to discuss obesity behavioral modification therapy today.  ASSESS: Tiffanny has the diagnosis of obesity and her BMI today is 37.1 Navil is in the action stage of change   ADVISE: Latrish was educated on the multiple health risks of obesity as well as the benefit of weight loss to improve her health. She was advised of the need for long term treatment and the importance of lifestyle modifications.  AGREE: Multiple dietary modification options and treatment options were discussed and  Arti agreed to keep a food journal with 1600 calories and 100+ grams of protein  We discussed the following Behavioral Modification Strategies today: increasing lean protein intake

## 2017-08-03 ENCOUNTER — Ambulatory Visit (INDEPENDENT_AMBULATORY_CARE_PROVIDER_SITE_OTHER): Payer: 59 | Admitting: Medical

## 2017-08-03 ENCOUNTER — Telehealth: Payer: 59 | Admitting: Physician Assistant

## 2017-08-03 ENCOUNTER — Encounter: Payer: Self-pay | Admitting: Medical

## 2017-08-03 VITALS — BP 116/83 | HR 95 | Temp 98.3°F | Resp 16 | Ht 70.0 in | Wt 265.6 lb

## 2017-08-03 DIAGNOSIS — H9203 Otalgia, bilateral: Secondary | ICD-10-CM

## 2017-08-03 DIAGNOSIS — H6123 Impacted cerumen, bilateral: Secondary | ICD-10-CM

## 2017-08-03 DIAGNOSIS — H6120 Impacted cerumen, unspecified ear: Secondary | ICD-10-CM

## 2017-08-03 MED ORDER — AMOXICILLIN-POT CLAVULANATE 875-125 MG PO TABS: 1.0000 | ORAL_TABLET | Freq: Two times a day (BID) | ORAL | 0 refills | Status: DC

## 2017-08-03 MED ORDER — FLUTICASONE PROPIONATE 50 MCG/ACT NA SUSP: 2.0000 | Freq: Every day | NASAL | 1 refills | Status: DC

## 2017-08-03 MED ORDER — NEOMYCIN-POLYMYXIN-HC 1 % OT SOLN: 3.0000 [drp] | Freq: Three times a day (TID) | OTIC | 0 refills | Status: DC

## 2017-08-03 NOTE — Progress Notes (Signed)
Based on what you shared with me it looks like you have a condition that should be evaluated in a face to face office visit. It seems that you likely have a cerumen (earwax) impaction. If symptoms are not resolving with OTC drops, I recommend you be seen in person to have the wax removed. This way it can be safely removed with resolution of your symptoms.   NOTE: Even if you have entered your credit card information for this eVisit, you will not be charged.   If you are having a true medical emergency please call 911.  If you need an urgent face to face visit, [facility] has four urgent care centers for your convenience.  If you need care fast and have a high deductible or no insurance consider:   [email or web address]  [phone]  [address] [place], [place] [address] 8 am to 8 pm Monday-Friday 10 am to 4 pm Saturday-Sunday   The following sites will take your  insurance:    . [facility] [place] a Provider at this Location  [address] [place], [place] [address] . 10 am to 8 pm Monday-Friday . 12 pm to 8 pm Saturday-Sunday   . [facility] at [place] a Provider at this Location  [address], [place], [place] [address] . 8 am to 8 pm Monday-Friday . 9 am to 6 pm Saturday . 11 am to 6 pm Sunday   . [facility] at [place] Get Driving Directions  [address] [place], [place] [address] . 8 am to 8 pm Monday-Friday . 8 am to 4 pm Saturday-Sunday   Your e-visit answers were reviewed by a board certified advanced clinical practitioner to complete your personal care plan.  Thank you for using e-Visits.

## 2017-08-03 NOTE — Patient Instructions (Addendum)
Our lavage of ear was unsuccessful.  Since you had pain on washing it was best to stop since you may have ear infection behind all the wax.   Will augmentin antibiotic to start today. Will also rx cortisporin otic drops as well.  On Saturday night stop cortisporin otic and restart debrox.  Follow up on Tuesday morning for lavage.

## 2017-08-03 NOTE — Progress Notes (Signed)
Subjective:    Patient ID: Kathryn Huff, female    DOB: 1991/07/19, 26 y.o.   MRN: [id number]  HPI Water in nose yesterday swimming with faint nasal congestion today. Pt got water in ears and now both side feel clogged and worse on rt.   No ear infection as adult. Otitis externa in past when she was younger.  LMP- 3 weeks ago.   Review of Systems  Constitutional: Negative for chills, fatigue and fever.  HENT: Positive for congestion and ear pain. Negative for facial swelling, mouth sores, postnasal drip, sinus pain and sinus pressure.        Faint nasal congestion.  Clogged sensation.  Respiratory: Negative for cough, chest tightness, shortness of breath and wheezing.   Cardiovascular: Negative for chest pain and palpitations.  Gastrointestinal: Negative for abdominal pain, nausea and vomiting.  Musculoskeletal: Negative for back pain and myalgias.  Skin: Negative for rash.  Neurological: Negative for dizziness, seizures, weakness, numbness and headaches.  Hematological: Negative for adenopathy. Does not bruise/bleed easily.  Psychiatric/Behavioral: Negative for behavioral problems and confusion. The patient is not nervous/anxious.    Past Medical History:  Diagnosis Date  . ADD (attention deficit disorder)   . Anxiety   . Back pain   . Chest pain   . Constipation   . Depression   . Fatty liver   . Liver function abnormality    HIGH  . Migraines   . Obesity   . Vitamin D deficiency      Social History   Social History  . Marital status: Married    Spouse name: Erlene Quan  . Number of children: N/A  . Years of education: N/A   Occupational History  . XRAY tech & Admission Associate [employer] History Main Topics  . Smoking status: Former Smoker    Years: 4.00  . Smokeless tobacco: Never Used  . Alcohol use Yes     Comment: occasional  . Drug use: No  . Sexual activity: Yes    Partners: Male    Birth control/ protection: Other-see comments   Comment: nuvaring   Other Topics Concern  . 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         No past surgical history on file.  Family History  Problem Relation Age of Onset  . Diabetes Sister 5       Type 1  . Celiac disease Sister   . Diabetes Mellitus II Maternal Grandmother   . Factor V Leiden deficiency Maternal Grandmother   . Factor V Leiden deficiency Mother   . Obesity Mother   . Hyperlipidemia Father   . Sleep apnea Father   . Obesity Father   . Cancer Paternal Grandmother        breast  . Breast cancer Paternal Grandmother   . Heart Problems Sister        left ventricular non-compaction  . Diabetes Mellitus II Maternal Grandfather   . Cancer Paternal Grandfather        throat/throat and lung, smoker    No Known Allergies  Current Outpatient Prescriptions on File Prior to Visit  Medication Sig Dispense Refill  . betamethasone dipropionate (DIPROLENE)  0.05 % cream Apply topically 2 (two) times daily. 30 g 1  . buPROPion (WELLBUTRIN SR) 200 MG 12 hr tablet Take 1 tablet (200 mg total) by mouth every morning. 30 tablet 0  . docusate sodium (COLACE) 100 MG capsule Take 1 capsule (100 mg total) by mouth 2 (two) times daily. 60 capsule 0  . ergocalciferol (VITAMIN D2) 50000 units capsule Take 1 capsule (50,000 Units total) by mouth once a week. 4 capsule 0  . etonogestrel-ethinyl estradiol (NUVARING) 0.12-0.015 MG/24HR vaginal ring Place 1 each vaginally every 28 (twenty-eight) days. Insert vaginally and leave in place for 3 consecutive weeks, then remove for 1 week.    . metFORMIN (GLUCOPHAGE) 500 MG tablet Take 1 tablet (500 mg total) by mouth 2 (two) times daily with a meal. 60 tablet 0  . nitrofurantoin,  macrocrystal-monohydrate, (MACROBID) 100 MG capsule Take 1 capsule (100 mg total) by mouth 2 (two) times daily. 10 capsule 0  . SUMAtriptan (IMITREX) 50 MG tablet One tab at start of migraine. May repeat in 2 hours if headache persists or recurs. 10 tablet 5   No current facility-administered medications on file prior to visit.     BP 116/83   Pulse 95   Temp 98.3 F (36.8 C) (Oral)   Resp 16   Ht 5' 10"  (1.778 m)   Wt 265 lb 9.6 oz (120.5 kg)   SpO2 100%   BMI 38.11 kg/m       Objective:   Physical Exam  General  Mental Status - Alert. General Appearance - Well groomed. Not in acute distress.  Skin Rashes- No Rashes.  HEENT Head- Normal. Ear Auditory Canal - Left- severe wax present. Can't see any portion of tm. Right - severe wax. Can't see any portion of tm..Tympanic Membrane- Left- not seen Right- not seen. Eye Sclera/Conjunctiva- Left- Normal. Right- Normal. Nose & Sinuses Nasal Mucosa- Left-  Boggy and Congested. Right-  Boggy and  Congested.Bilateral faint maxillary and faint  frontal sinus pressure. Mouth & Throat Lips: Upper Lip- Normal: no dryness, cracking, pallor, cyanosis, or vesicular eruption. Lower Lip-Normal: no dryness, cracking, pallor, cyanosis or vesicular eruption. Buccal Mucosa- Bilateral- No Aphthous ulcers. Oropharynx- No Discharge or Erythema. Tonsils: Characteristics- Bilateral- No Erythema or Congestion. Size/Enlargement- Bilateral- No enlargement. Discharge- bilateral-None.  Neck Neck- Supple. No Masses.   Chest and Lung Exam Auscultation: Breath Sounds:-Clear even and unlabored.  Cardiovascular Auscultation:Rythm- Regular, rate and rhythm. Murmurs & Other Heart Sounds:Ausculatation of the heart reveal- No Murmurs.  Lymphatic Head & Neck General Head & Neck Lymphatics: Bilateral: Description- No Localized lymphadenopathy.       Assessment & Plan:  Our lavage of ear was unsuccessful.  Since you had pain on washing it  was best  to stop since you may have ear infection behind all the wax.   Will augmentin antibiotic to start today. Will also rx cortisporin otic drops as well.  On Saturday night stop cortisporin otic and restart debrox.  Follow up on Tuesday morning for lavage.  Later sent flonase. This may help faint congestion/pressrure  Rosaire Cueto, Percell Miller, [employer]

## 2017-08-04 MED FILL — FLUTICASONE PROP 50 MCG SPR: 50 | 30 days supply | Qty: 16 | Fill #0

## 2017-08-08 ENCOUNTER — Encounter: Payer: Self-pay | Admitting: Medical

## 2017-08-08 ENCOUNTER — Ambulatory Visit (INDEPENDENT_AMBULATORY_CARE_PROVIDER_SITE_OTHER): Payer: 59 | Admitting: Medical

## 2017-08-08 VITALS — BP 110/46 | HR 79 | Temp 98.5°F | Resp 16 | Ht 70.0 in | Wt 267.0 lb

## 2017-08-08 DIAGNOSIS — J3489 Other specified disorders of nose and nasal sinuses: Secondary | ICD-10-CM

## 2017-08-08 DIAGNOSIS — H6690 Otitis media, unspecified, unspecified ear: Secondary | ICD-10-CM | POA: Diagnosis not present

## 2017-08-08 DIAGNOSIS — H6091 Unspecified otitis externa, right ear: Secondary | ICD-10-CM

## 2017-08-08 DIAGNOSIS — H6121 Impacted cerumen, right ear: Secondary | ICD-10-CM | POA: Diagnosis not present

## 2017-08-08 MED ORDER — CEFTRIAXONE SODIUM 1 G IJ SOLR
1.0000 g | Freq: Once | INTRAMUSCULAR | Status: AC
  Administered 2017-08-08: 1 g via INTRAMUSCULAR

## 2017-08-08 MED ORDER — NEOMYCIN-POLYMYXIN-HC 3.5-10000-1 OP SUSP: 3.0000 [drp] | Freq: Three times a day (TID) | OPHTHALMIC | 0 refills | Status: DC

## 2017-08-08 NOTE — Patient Instructions (Addendum)
Rt side wax removed successfully. This was symptomatic side.  For possible ear infection and persisting sinus pressure rocephin 1 gram today and continue augmentin. Start flonase.  Will add cortipsorin ear drops.(discussed with pharmacy and she dispensed generic equivalent ear drop)  Left ear wax present but we stopped lavage due to pain. No symptom before lavage on left side so not necessary to remove wax. If you become symptomatic on left side use debrox for 5 days then come in for lavage.  Follow up in 7-10 days or as needed

## 2017-08-08 NOTE — Progress Notes (Signed)
Subjective:    Patient ID: Kathryn Huff, female    DOB: 11-15-1991, 26 y.o.   MRN: [id number]  HPI  Pt with some persisting rt ear clogged sensation. Not as painful as before. She tried lavage yesterday at home to rt ear and no pain  Pt states her sinuses are feeling more congested now. No teeth pain. Not blowing out mucous. No fever, no chills or sweats reported.   Review of Systems  Constitutional: Negative for chills, fatigue and fever.  HENT: Positive for congestion, sinus pain and sinus pressure. Negative for facial swelling, mouth sores, nosebleeds, postnasal drip, rhinorrhea and tinnitus.        Ear clogged/blocked.  Respiratory: Negative for cough, chest tightness, shortness of breath and wheezing.   Cardiovascular: Negative for chest pain and palpitations.  Gastrointestinal: Negative for abdominal pain.  Musculoskeletal: Negative for back pain.  Skin: Negative for rash.  Neurological: Negative for dizziness, numbness and headaches.  Hematological: Negative for adenopathy. Does not bruise/bleed easily.  Psychiatric/Behavioral: Negative for behavioral problems, confusion, dysphoric mood and hallucinations.   Past Medical History:  Diagnosis Date  . ADD (attention deficit disorder)   . Anxiety   . Back pain   . Chest pain   . Constipation   . Depression   . Fatty liver   . Liver function abnormality    HIGH  . Migraines   . Obesity   . Vitamin D deficiency      Social History   Social History  . Marital status: Married    Spouse name: Erlene Quan  . Number of children: N/A  . Years of education: N/A   Occupational History  . XRAY tech & Admission Associate [place] History Main Topics  . Smoking status: Former Smoker    Years: 4.00  . Smokeless tobacco: Never Used  . Alcohol use Yes     Comment: occasional  . Drug use: No  . Sexual activity: Yes    Partners: Male    Birth control/ protection: Other-see comments     Comment: nuvaring    Other Topics Concern  . 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         No past surgical history on file.  Family History  Problem Relation Age of Onset  . Diabetes Sister 5       Type 1  . Celiac disease Sister   . Diabetes Mellitus II Maternal Grandmother   . Factor V Leiden deficiency Maternal Grandmother   . Factor V Leiden deficiency Mother   . Obesity Mother   . Hyperlipidemia Father   . Sleep apnea Father   . Obesity Father   . Cancer Paternal Grandmother        breast  . Breast cancer Paternal Grandmother   . Heart Problems Sister        left ventricular non-compaction  . Diabetes Mellitus II Maternal Grandfather   . Cancer Paternal Grandfather        throat/throat and lung, smoker    No Known Allergies  Current Outpatient Prescriptions on File Prior to Visit  Medication Sig Dispense Refill  . amoxicillin-clavulanate (AUGMENTIN) 875-125 MG tablet Take 1 tablet  by mouth 2 (two) times daily. 20 tablet 0  . betamethasone dipropionate (DIPROLENE) 0.05 % cream Apply topically 2 (two) times daily. 30 g 1  . buPROPion (WELLBUTRIN SR) 200 MG 12 hr tablet Take 1 tablet (200 mg total) by mouth every morning. 30 tablet 0  . docusate sodium (COLACE) 100 MG capsule Take 1 capsule (100 mg total) by mouth 2 (two) times daily. 60 capsule 0  . ergocalciferol (VITAMIN D2) 50000 units capsule Take 1 capsule (50,000 Units total) by mouth once a week. 4 capsule 0  . etonogestrel-ethinyl estradiol (NUVARING) 0.12-0.015 MG/24HR vaginal ring Place 1 each vaginally every 28 (twenty-eight) days. Insert vaginally and leave in place for 3 consecutive weeks, then remove for 1 week.    . fluticasone (FLONASE) 50 MCG/ACT nasal spray Place 2 sprays into  both nostrils daily. 16 g 1  . metFORMIN (GLUCOPHAGE) 500 MG tablet Take 1 tablet (500 mg total) by mouth 2 (two) times daily with a meal. 60 tablet 0  . NEOMYCIN-POLYMYXIN-HYDROCORTISONE (CORTISPORIN) 1 % SOLN OTIC solution Place 3 drops into both ears every 8 (eight) hours. 10 mL 0  . nitrofurantoin, macrocrystal-monohydrate, (MACROBID) 100 MG capsule Take 1 capsule (100 mg total) by mouth 2 (two) times daily. 10 capsule 0  . SUMAtriptan (IMITREX) 50 MG tablet One tab at start of migraine. May repeat in 2 hours if headache persists or recurs. 10 tablet 5   No current facility-administered medications on file prior to visit.     BP (!) 110/46   Pulse 79   Temp 98.5 F (36.9 C) (Oral)   Resp 16   Ht 5' 10"  (1.778 m)   Wt 267 lb (121.1 kg)   SpO2 100%   BMI 38.31 kg/m       Objective:   Physical Exam  General  Mental Status - Alert. General Appearance - Well groomed. Not in acute distress.  Skin Rashes- No Rashes.  HEENT Head- Normal. Ear Auditory Canal - Left- wax obstuction moderate.(pt could not tolerate lavage that side) Right - clear post lavageTympanic Membrane- Left- Normal. Right- red tm post lavage. Eye Sclera/Conjunctiva- Left- Normal. Right- Normal. Nose & Sinuses Nasal Mucosa- Left-  Boggy and Congested. Right-  Boggy and  Congested.Bilateral  Faint maxillary and  Faint frontal sinus pressure. Mouth & Throat Lips: Upper Lip- Normal: no dryness, cracking, pallor, cyanosis, or vesicular eruption. Lower Lip-Normal: no dryness, cracking, pallor, cyanosis or vesicular eruption. Buccal Mucosa- Bilateral- No Aphthous ulcers. Oropharynx- No Discharge or Erythema. Tonsils: Characteristics- Bilateral- No Erythema or Congestion. Size/Enlargement- Bilateral- No enlargement. Discharge- bilateral-None.  Neck Neck- Supple. No Masses.   Chest and Lung Exam Auscultation: Breath Sounds:-Clear even and unlabored.  Cardiovascular Auscultation:Rythm- Regular, rate and  rhythm. Murmurs & Other Heart Sounds:Ausculatation of the heart reveal- No Murmurs.  Lymphatic Head & Neck General Head & Neck Lymphatics: Bilateral: Description- No Localized lymphadenopathy.       Assessment & Plan:  Rt side wax removed successfully. This was symptomatic side.  For possible ear infection and persisting sinus pressure rocephin 1 gram today and continue augmentin. Start flonase.  Will add cortipsorin ear drops. (discussed with pharmacy and she dispensed generic equivalent ear drop). Per pharmacist looks like I accidentally clicked on eye drops initially. So ok'd ear drops.  Left ear wax present but we stopped lavage due to pain. No symptom before lavage on left side so not necessary to remove wax. If you become symptomatic on left side use debrox for 5 days  then come in for lavage.  Follow up in 7-10 days or as needed  Eligio Angert, Percell Miller, [employer]

## 2017-08-14 ENCOUNTER — Ambulatory Visit (INDEPENDENT_AMBULATORY_CARE_PROVIDER_SITE_OTHER): Payer: 59 | Admitting: Physician Assistant

## 2017-08-17 ENCOUNTER — Ambulatory Visit (INDEPENDENT_AMBULATORY_CARE_PROVIDER_SITE_OTHER): Payer: 59 | Admitting: Physician Assistant

## 2017-08-17 VITALS — BP 107/73 | HR 79 | Temp 98.4°F | Ht 70.0 in | Wt 261.0 lb

## 2017-08-17 DIAGNOSIS — IMO0001 Reserved for inherently not codable concepts without codable children: Secondary | ICD-10-CM

## 2017-08-17 DIAGNOSIS — E669 Obesity, unspecified: Secondary | ICD-10-CM

## 2017-08-17 DIAGNOSIS — E8881 Metabolic syndrome: Secondary | ICD-10-CM | POA: Diagnosis not present

## 2017-08-17 DIAGNOSIS — E559 Vitamin D deficiency, unspecified: Secondary | ICD-10-CM | POA: Insufficient documentation

## 2017-08-17 DIAGNOSIS — Z6837 Body mass index (BMI) 37.0-37.9, adult: Secondary | ICD-10-CM

## 2017-08-17 DIAGNOSIS — Z9189 Other specified personal risk factors, not elsewhere classified: Secondary | ICD-10-CM | POA: Diagnosis not present

## 2017-08-17 DIAGNOSIS — F3289 Other specified depressive episodes: Secondary | ICD-10-CM

## 2017-08-17 MED ORDER — BUPROPION HCL ER (SR) 200 MG PO TB12: 200.0000 mg | ORAL_TABLET | Freq: Every morning | ORAL | 0 refills | Status: DC

## 2017-08-17 MED ORDER — ERGOCALCIFEROL 1.25 MG (50000 UT) PO CAPS: 50000.0000 [IU] | ORAL_CAPSULE | ORAL | 0 refills | Status: DC

## 2017-08-17 MED ORDER — METFORMIN HCL 500 MG PO TABS: 500.0000 mg | ORAL_TABLET | Freq: Two times a day (BID) | ORAL | 0 refills | Status: DC

## 2017-08-17 MED FILL — VIT D2 1.25 MG (50,000 UNIT: 1.25 MG | 28 days supply | Qty: 4 | Fill #0

## 2017-08-17 MED FILL — BUPROPION HCL SR 200 MG TAB: 200 | 30 days supply | Qty: 30 | Fill #0

## 2017-08-17 MED FILL — metFORMIN HCL 500 MG TABS: 500 | 30 days supply | Qty: 60 | Fill #0

## 2017-08-17 NOTE — Progress Notes (Signed)
Office: [phone]  /  Fax: [phone]   HPI:   Chief Complaint: OBESITY Kathryn Huff is here to discuss her progress with her obesity treatment plan. She is on the  keep a food journal with 1600 calories and 100+ grams of protein  and is following her eating plan approximately 50 % of the time. She states she is exercising 0 minutes 0 times per week. Kathryn Huff  Has been sick with a viral upper respiratory infection and found it harder to plan ahead. She is now better and is ready to get back on track Her weight is 261 lb (118.4 kg) today and she has had a weight gain of 3 pounds over a period of 3 weeks since her last visit. She has lost 14 lbs since starting treatment with [name].  Vitamin D deficiency Kathryn Huff has a Huff of vitamin D deficiency. She is currently taking vit D and denies nausea, vomiting or muscle weakness.  Insulin Resistance Kathryn Huff of insulin resistance based on her elevated fasting insulin level >5. Although Kathryn Huff blood glucose readings are still under good control, insulin resistance puts her Kathryn greater risk of metabolic syndrome and diabetes. She is taking metformin currently and continues to work on diet and exercise to decrease risk of diabetes.  Kathryn risk for diabetes Kathryn Huff is Kathryn higher than average risk for developing diabetes due to her obesity and insulin resistance. She currently denies polyuria or polydipsia.  Depression with emotional eating behaviors Kathryn Huff is struggling with emotional eating and using food for comfort to the extent that it is negatively impacting her health. She often snacks when she is not hungry. Kathryn Huff sometimes feels she is out of control and then feels guilty that she made poor food choices. She has been working on behavior modification techniques to help reduce her emotional eating and has been somewhat successful. Her mood is stable and she shows no sign of suicidal or homicidal ideations.  Depression screen Kathryn Huff 2/9  03/01/2017 09/14/2016  Kathryn Huff 1 0  Kathryn Huff, Kathryn Huff, Kathryn Huff 2 0  Kathryn Huff 3 0  Altered sleeping 1 -  Tired, Kathryn energy 3 -  Change in appetite 2 -  Feeling bad or failure about yourself  3 -  Trouble concentrating 3 -  Moving slowly or fidgety/restless 1 -  Suicidal thoughts 1 -  Kathryn-9 Huff 17 -      ALLERGIES: No Known Allergies  MEDICATIONS: Current Outpatient Prescriptions on File Prior to Visit  Medication Sig Dispense Refill  . amoxicillin-clavulanate (AUGMENTIN) 875-125 MG tablet Take 1 tablet by mouth 2 (two) times daily. 20 tablet 0  . betamethasone dipropionate (DIPROLENE) 0.05 % cream Apply topically 2 (two) times daily. 30 g 1  . docusate sodium (COLACE) 100 MG capsule Take 1 capsule (100 mg total) by mouth 2 (two) times daily. 60 capsule 0  . etonogestrel-ethinyl estradiol (NUVARING) 0.12-0.015 MG/24HR vaginal ring Place 1 each vaginally every 28 (twenty-eight) days. Insert vaginally and leave in place for 3 consecutive weeks, then remove for 1 week.    . fluticasone (FLONASE) 50 MCG/ACT nasal spray Place 2 sprays into both nostrils daily. 16 g 1  . NEOMYCIN-POLYMYXIN-HYDROCORTISONE (CORTISPORIN) 1 % SOLN OTIC solution Place 3 drops into both ears every 8 (eight) hours. 10 mL 0  . neomycin-polymyxin-hydrocortisone (CORTISPORIN) 3.5-10000-1 ophthalmic suspension Place 3 drops into the right eye 3 (three) times daily. 7.5 mL 0  . nitrofurantoin, macrocrystal-monohydrate, (MACROBID) 100 MG capsule Take 1 capsule (100 mg  total) by mouth 2 (two) times daily. 10 capsule 0  . SUMAtriptan (IMITREX) 50 MG tablet One tab Kathryn start of migraine. May repeat in 2 hours if headache persists or recurs. 10 tablet 5   No current facility-administered medications on file prior to visit.     PAST MEDICAL HISTORY: Past Medical History:  Huff Date  . ADD (attention deficit disorder)   . Anxiety   . Back pain   . Chest pain   . Constipation   . Depression     . Fatty liver   . Liver function abnormality    HIGH  . Migraines   . Obesity   . Vitamin D deficiency     PAST SURGICAL HISTORY: No past surgical history on file.  SOCIAL HISTORY: Social History  Substance Use Topics  . Smoking status: Former Smoker    Years: 4.00  . Smokeless tobacco: Never Used  . Alcohol use Yes     Comment: occasional    FAMILY HISTORY: Family History  Problem Relation Age of Onset  . Diabetes Sister 5       Type 1  . Celiac disease Sister   . Diabetes Huff II Maternal Grandmother   . Factor V Leiden deficiency Maternal Grandmother   . Factor V Leiden deficiency Mother   . Obesity Mother   . Hyperlipidemia Father   . Sleep apnea Father   . Obesity Father   . Cancer Paternal Grandmother        breast  . Breast cancer Paternal Grandmother   . Heart Problems Sister        left ventricular non-compaction  . Diabetes Huff II Maternal Grandfather   . Cancer Paternal Grandfather        throat/throat and lung, smoker    ROS: Review of Systems  Constitutional: Negative for weight loss.  Gastrointestinal: Negative for nausea and vomiting.  Genitourinary: Negative for frequency.  Musculoskeletal:       Negative muscle weakness  Endo/Heme/Allergies: Negative for polydipsia.  Psychiatric/Behavioral: Positive for depression. Negative for suicidal ideas.    PHYSICAL EXAM: Blood pressure 107/73, pulse 79, temperature 98.4 F (36.9 C), temperature source Oral, height 5' 10"  (1.778 m), weight 261 lb (118.4 kg), SpO2 98 %. Body mass index is 37.45 kg/m. Physical Exam  Constitutional: She is oriented to person, place, and time. She appears well-developed and well-nourished.  Cardiovascular: Normal rate.   Pulmonary/Chest: Effort normal.  Musculoskeletal: Normal range of motion.  Neurological: She is oriented to person, place, and time.  Skin: Skin is warm and dry.  Psychiatric: She has a normal mood and affect. Her behavior is normal.   Vitals reviewed.   RECENT LABS AND TESTS: BMET    Component Value Date/Time   NA 142 06/15/2017 0934   K 4.5 06/15/2017 0934   CL 106 06/15/2017 0934   CO2 20 06/15/2017 0934   GLUCOSE 97 06/15/2017 0934   GLUCOSE 101 (H) 08/26/2015 0751   BUN 13 06/15/2017 0934   CREATININE 0.79 06/15/2017 0934   CREATININE 0.79 05/30/2014 0859   CALCIUM 9.1 06/15/2017 0934   GFRNONAA 104 06/15/2017 0934   GFRNONAA >89 05/30/2014 0859   GFRAA 120 06/15/2017 0934   GFRAA >89 05/30/2014 0859   Lab Results  Component Value Date   HGBA1C 5.5 06/15/2017   HGBA1C 5.4 03/01/2017   Lab Results  Component Value Date   INSULIN 29.7 (H) 06/15/2017   INSULIN 32.5 (H) 03/01/2017   CBC  Component Value Date/Time   WBC 10.6 03/01/2017 1041   WBC 9.2 08/26/2015 0751   RBC 4.64 03/01/2017 1041   RBC 4.57 08/26/2015 0751   HGB 13.2 03/01/2017 1041   HCT 38.6 03/01/2017 1041   PLT 276.0 08/26/2015 0751   MCV 83 03/01/2017 1041   MCH 28.4 03/01/2017 1041   MCH 28.4 05/30/2014 0859   MCHC 34.2 03/01/2017 1041   MCHC 33.5 08/26/2015 0751   RDW 13.8 03/01/2017 1041   LYMPHSABS 2.3 03/01/2017 1041   MONOABS 0.7 08/26/2015 0751   EOSABS 0.1 03/01/2017 1041   BASOSABS 0.0 03/01/2017 1041   Iron/TIBC/Ferritin/ %Sat No results found for: IRON, TIBC, FERRITIN, IRONPCTSAT Lipid Panel     Component Value Date/Time   CHOL 160 06/15/2017 0934   TRIG 150 (H) 06/15/2017 0934   HDL 53 06/15/2017 0934   CHOLHDL 3 09/14/2016 0827   VLDL 34.2 09/14/2016 0827   LDLCALC 77 06/15/2017 0934   Hepatic Function Panel     Component Value Date/Time   PROT 6.7 06/15/2017 0934   ALBUMIN 3.9 06/15/2017 0934   AST 13 06/15/2017 0934   ALT 17 06/15/2017 0934   ALKPHOS 93 06/15/2017 0934   BILITOT 0.3 06/15/2017 0934   BILIDIR 0.1 08/26/2015 0751   IBILI 0.3 05/30/2014 0859      Component Value Date/Time   TSH 3.200 03/01/2017 1041   TSH 3.72 08/26/2015 0751   TSH 2.976 05/30/2014 0859     ASSESSMENT AND PLAN: Vitamin D deficiency - Plan: ergocalciferol (VITAMIN D2) 50000 units capsule  Insulin resistance - Plan: metFORMIN (GLUCOPHAGE) 500 MG tablet  Other depression - Plan: buPROPion (WELLBUTRIN SR) 200 MG 12 hr tablet  Kathryn Huff  Class 2 obesity with serious comorbidity and body mass index (BMI) of 37.0 to 37.9 in adult, unspecified obesity type  PLAN:  Vitamin D Deficiency Zaydee was informed that low vitamin D levels contributes to fatigue and are associated with obesity, breast, and colon cancer. She agrees to continue to take prescription Vit D @50 ,000 IU every week, we will refill for 1 month and will follow up for routine testing of vitamin D, Kathryn least 2-3 times per year. She was informed of the risk of over-replacement of vitamin D and agrees to not increase her dose unless he discusses this with [name] first. Kiannah agrees to follow up with our clinic in 2 weeks.  Insulin Resistance Samariyah will continue to work on weight loss, exercise, and decreasing simple carbohydrates in her diet to help decrease the risk of diabetes. We dicussed metformin including benefits and risks. She was informed that eating too many simple carbohydrates or too many calories Kathryn one sitting increases the likelihood of GI side effects. Shequilla agrees to continue metformin 500 mg bid #60 with no refills and she will follow up with [name] as directed to monitor her progress.  Diabetes risk counseling Rosita was given extended (15 minutes) diabetes prevention counseling today. She is 26 y.o. female and has risk factors for diabetes including obesity and insulin resistance. We discussed intensive lifestyle modifications today with an emphasis on weight loss as well as increasing exercise and decreasing simple carbohydrates in her diet.  Depression with Emotional Eating Behaviors We discussed behavior modification techniques today to help Evelia deal with her emotional eating and  depression. She has agreed to continue to take Wellbutrin SR 200 mg qd #30 with no refills and will follow up as directed.  Obesity Chantele is currently in  the action stage of change. As such, her goal is to continue with weight loss efforts She has agreed to keep a food journal with 1600 calories and 100+ grams of protein daily Sheral has been instructed to work up to a goal of 150 minutes of combined cardio and strengthening exercise per week for weight loss and overall health benefits. We discussed the following Behavioral Modification Strategies today: increasing lean protein intake and meal planning & cooking strategies  Karessa has agreed to follow up with our clinic in 2 weeks. She was informed of the importance of frequent follow up visits to maximize her success with intensive lifestyle modifications for her multiple health conditions.  I, Doreene Nest, am acting as transcriptionist for Lacy Duverney, PA-C  I have reviewed the above documentation for accuracy and completeness, and I agree with the above. -Lacy Duverney, PA-C  I have reviewed the above note and agree with the plan. -Dennard Nip, MD   OBESITY BEHAVIORAL INTERVENTION VISIT  Today's visit was # 12 out of 22.  Starting weight: 275 lbs Starting date: 03/01/17 Today's weight : 261 lbs Today's date: 08/17/2017 Total lbs lost to date: 14 (Patients must lose 7 lbs in the first 6 months to continue with counseling)   ASK: We discussed the Huff of obesity with Hulan Fray today and Marisa agreed to give [name] permission to discuss obesity behavioral modification therapy today.  ASSESS: Makyia has the Huff of obesity and her BMI today is 37.45 Raiven is in the action stage of change   ADVISE: [place] was educated on the multiple health risks of obesity as well as the benefit of weight loss to improve her health. She was advised of the need for long term treatment and the importance of lifestyle  modifications.  AGREE: Multiple dietary modification options and treatment options were discussed and  Anisah agreed to keep a food journal with 1600 calories and 100+ grams of protein  daily  We discussed the following Behavioral Modification Strategies today: increasing lean protein intake and meal planning & cooking strategies

## 2017-08-24 MED FILL — NUVARING VAGINAL RING: 0.12-0.015 | 84 days supply | Qty: 3 | Fill #2

## 2017-08-31 ENCOUNTER — Ambulatory Visit (INDEPENDENT_AMBULATORY_CARE_PROVIDER_SITE_OTHER): Payer: 59 | Admitting: Physician Assistant

## 2017-08-31 VITALS — BP 112/76 | HR 73 | Temp 98.0°F | Ht 70.0 in | Wt 263.0 lb

## 2017-08-31 DIAGNOSIS — Z6837 Body mass index (BMI) 37.0-37.9, adult: Secondary | ICD-10-CM | POA: Diagnosis not present

## 2017-08-31 DIAGNOSIS — E669 Obesity, unspecified: Secondary | ICD-10-CM | POA: Diagnosis not present

## 2017-08-31 DIAGNOSIS — E559 Vitamin D deficiency, unspecified: Secondary | ICD-10-CM | POA: Diagnosis not present

## 2017-08-31 DIAGNOSIS — IMO0001 Reserved for inherently not codable concepts without codable children: Secondary | ICD-10-CM

## 2017-08-31 NOTE — Progress Notes (Signed)
Office: [phone]  /  Fax: [phone]   HPI:   Chief Complaint: OBESITY Kathryn Huff is here to discuss her progress with her obesity treatment plan. She is on the keep a food journal with 1600 calories and 100+ grams of protein daily and is following her eating plan approximately 60 % of the time. She states she is exercising 0 minutes 0 times per week. Kathryn Huff has been moving to a new house and planning meals ahead has been hard. She is eating out more but she is making smarter food choices and is controlling her portions. Her weight is 263 lb (119.3 kg) today and she has had a weight gain of 2 pounds over a period of 2 weeks since her last visit. She has lost 12 lbs since starting treatment with [name].  Vitamin D deficiency Kathryn Huff has a diagnosis of vitamin D deficiency. She is currently taking vit D and denies nausea, vomiting or muscle weakness.   ALLERGIES: No Known Allergies  MEDICATIONS: Current Outpatient Prescriptions on File Prior to Visit  Medication Sig Dispense Refill  . buPROPion (WELLBUTRIN SR) 200 MG 12 hr tablet Take 1 tablet (200 mg total) by mouth every morning. 30 tablet 0  . ergocalciferol (VITAMIN D2) 50000 units capsule Take 1 capsule (50,000 Units total) by mouth once a week. 4 capsule 0  . etonogestrel-ethinyl estradiol (NUVARING) 0.12-0.015 MG/24HR vaginal ring Place 1 each vaginally every 28 (twenty-eight) days. Insert vaginally and leave in place for 3 consecutive weeks, then remove for 1 week.    . fluticasone (FLONASE) 50 MCG/ACT nasal spray Place 2 sprays into both nostrils daily. 16 g 1  . metFORMIN (GLUCOPHAGE) 500 MG tablet Take 1 tablet (500 mg total) by mouth 2 (two) times daily with a meal. 60 tablet 0  . SUMAtriptan (IMITREX) 50 MG tablet One tab at start of migraine. May repeat in 2 hours if headache persists or recurs. 10 tablet 5   No current facility-administered medications on file prior to visit.     PAST MEDICAL HISTORY: Past Medical  History:  Diagnosis Date  . ADD (attention deficit disorder)   . Anxiety   . Back pain   . Chest pain   . Constipation   . Depression   . Fatty liver   . Liver function abnormality    HIGH  . Migraines   . Obesity   . Vitamin D deficiency     PAST SURGICAL HISTORY: No past surgical history on file.  SOCIAL HISTORY: Social History  Substance Use Topics  . Smoking status: Former Smoker    Years: 4.00  . Smokeless tobacco: Never Used  . Alcohol use Yes     Comment: occasional    FAMILY HISTORY: Family History  Problem Relation Age of Onset  . Diabetes Sister 5       Type 1  . Celiac disease Sister   . Diabetes Mellitus II Maternal Grandmother   . Factor V Leiden deficiency Maternal Grandmother   . Factor V Leiden deficiency Mother   . Obesity Mother   . Hyperlipidemia Father   . Sleep apnea Father   . Obesity Father   . Cancer Paternal Grandmother        breast  . Breast cancer Paternal Grandmother   . Heart Problems Sister        left ventricular non-compaction  . Diabetes Mellitus II Maternal Grandfather   . Cancer Paternal Grandfather        throat/throat and lung, smoker  ROS: Review of Systems  Constitutional: Negative for weight loss.  Gastrointestinal: Negative for nausea and vomiting.  Musculoskeletal:       Negative muscle weakness    PHYSICAL EXAM: Blood pressure 112/76, pulse 73, temperature 98 F (36.7 C), height 5' 10"  (1.778 m), weight 263 lb (119.3 kg), SpO2 98 %. Body mass index is 37.74 kg/m. Physical Exam  Constitutional: She is oriented to person, place, and time. She appears well-developed and well-nourished.  Cardiovascular: Normal rate.   Pulmonary/Chest: Effort normal.  Musculoskeletal: Normal range of motion.  Neurological: She is oriented to person, place, and time.  Skin: Skin is warm and dry.  Psychiatric: She has a normal mood and affect. Her behavior is normal.  Vitals reviewed.   RECENT LABS AND TESTS: BMET      Component Value Date/Time   NA 142 06/15/2017 0934   K 4.5 06/15/2017 0934   CL 106 06/15/2017 0934   CO2 20 06/15/2017 0934   GLUCOSE 97 06/15/2017 0934   GLUCOSE 101 (H) 08/26/2015 0751   BUN 13 06/15/2017 0934   CREATININE 0.79 06/15/2017 0934   CREATININE 0.79 05/30/2014 0859   CALCIUM 9.1 06/15/2017 0934   GFRNONAA 104 06/15/2017 0934   GFRNONAA >89 05/30/2014 0859   GFRAA 120 06/15/2017 0934   GFRAA >89 05/30/2014 0859   Lab Results  Component Value Date   HGBA1C 5.5 06/15/2017   HGBA1C 5.4 03/01/2017   Lab Results  Component Value Date   INSULIN 29.7 (H) 06/15/2017   INSULIN 32.5 (H) 03/01/2017   CBC    Component Value Date/Time   WBC 10.6 03/01/2017 1041   WBC 9.2 08/26/2015 0751   RBC 4.64 03/01/2017 1041   RBC 4.57 08/26/2015 0751   HGB 13.2 03/01/2017 1041   HCT 38.6 03/01/2017 1041   PLT 276.0 08/26/2015 0751   MCV 83 03/01/2017 1041   MCH 28.4 03/01/2017 1041   MCH 28.4 05/30/2014 0859   MCHC 34.2 03/01/2017 1041   MCHC 33.5 08/26/2015 0751   RDW 13.8 03/01/2017 1041   LYMPHSABS 2.3 03/01/2017 1041   MONOABS 0.7 08/26/2015 0751   EOSABS 0.1 03/01/2017 1041   BASOSABS 0.0 03/01/2017 1041   Iron/TIBC/Ferritin/ %Sat No results found for: IRON, TIBC, FERRITIN, IRONPCTSAT Lipid Panel     Component Value Date/Time   CHOL 160 06/15/2017 0934   TRIG 150 (H) 06/15/2017 0934   HDL 53 06/15/2017 0934   CHOLHDL 3 09/14/2016 0827   VLDL 34.2 09/14/2016 0827   LDLCALC 77 06/15/2017 0934   Hepatic Function Panel     Component Value Date/Time   PROT 6.7 06/15/2017 0934   ALBUMIN 3.9 06/15/2017 0934   AST 13 06/15/2017 0934   ALT 17 06/15/2017 0934   ALKPHOS 93 06/15/2017 0934   BILITOT 0.3 06/15/2017 0934   BILIDIR 0.1 08/26/2015 0751   IBILI 0.3 05/30/2014 0859      Component Value Date/Time   TSH 3.200 03/01/2017 1041   TSH 3.72 08/26/2015 0751   TSH 2.976 05/30/2014 0859    ASSESSMENT AND PLAN: Vitamin D deficiency  Class 2 obesity  with serious comorbidity and body mass index (BMI) of 37.0 to 37.9 in adult, unspecified obesity type  PLAN:  Vitamin D Deficiency Kathryn Huff was informed that low vitamin D levels contributes to fatigue and are associated with obesity, breast, and colon cancer. She agrees to continue to take prescription Vit D @50 ,000 IU every week and will follow up for routine testing of vitamin D,  at least 2-3 times per year. She was informed of the risk of over-replacement of vitamin D and agrees to not increase her dose unless he discusses this with [name] first.  We spent > than 50% of the 15 minute visit on the counseling as documented in the note.  Obesity Dawnya is currently in the action stage of change. As such, her goal is to continue with weight loss efforts She has agreed to keep a food journal with 1600 calories and 100+ grams of protein daily Milessa has been instructed to work up to a goal of 150 minutes of combined cardio and strengthening exercise per week for weight loss and overall health benefits. We discussed the following Behavioral Modification Strategies today: increasing lean protein intake and meal planning ideas.  Lovenia has agreed to follow up with our clinic in 3 weeks. She was informed of the importance of frequent follow up visits to maximize her success with intensive lifestyle modifications for her multiple health conditions.  I, Doreene Nest, am acting as transcriptionist for Lacy Duverney, PA-C  I have reviewed the above documentation for accuracy and completeness, and I agree with the above. -Lacy Duverney, PA-C  I have reviewed the above note and agree with the plan. -Dennard Nip, MD   OBESITY BEHAVIORAL INTERVENTION VISIT  Today's visit was # 13 out of 22.  Starting weight: 275 lbs Starting date: 03/01/17 Today's weight : 263 lbs Today's date: 08/31/2017 Total lbs lost to date: 12 (Patients must lose 7 lbs in the first 6 months to continue with counseling)   ASK: We  discussed the diagnosis of obesity with Hulan Fray today and Faithanne agreed to give [name] permission to discuss obesity behavioral modification therapy today.  ASSESS: [place] has the diagnosis of obesity and her BMI today is 37.74 Jericka is in the action stage of change   ADVISE: Saralee was educated on the multiple health risks of obesity as well as the benefit of weight loss to improve her health. She was advised of the need for long term treatment and the importance of lifestyle modifications.  AGREE: Multiple dietary modification options and treatment options were discussed and  Sundra agreed to keep a food journal with 1600 calories and 100+ grams of protein daily We discussed the following Behavioral Modification Strategies today: increasing lean protein intake and work on meal planning and easy cooking plans

## 2017-09-20 ENCOUNTER — Ambulatory Visit (INDEPENDENT_AMBULATORY_CARE_PROVIDER_SITE_OTHER): Payer: 59 | Admitting: Physician Assistant

## 2017-09-20 VITALS — BP 116/79 | HR 94 | Temp 98.3°F | Ht 70.0 in | Wt 261.0 lb

## 2017-09-20 DIAGNOSIS — E8881 Metabolic syndrome: Secondary | ICD-10-CM

## 2017-09-20 DIAGNOSIS — E781 Pure hyperglyceridemia: Secondary | ICD-10-CM | POA: Diagnosis not present

## 2017-09-20 DIAGNOSIS — E559 Vitamin D deficiency, unspecified: Secondary | ICD-10-CM

## 2017-09-20 DIAGNOSIS — Z6837 Body mass index (BMI) 37.0-37.9, adult: Secondary | ICD-10-CM

## 2017-09-20 DIAGNOSIS — Z9189 Other specified personal risk factors, not elsewhere classified: Secondary | ICD-10-CM

## 2017-09-20 NOTE — Progress Notes (Addendum)
Office: [phone]  /  Fax: [phone]   HPI:   Chief Complaint: OBESITY [name] is here to discuss her progress with her obesity treatment plan. She is on the keep a food journal with 1600 calories and 100+ grams of protein daily and is following her eating plan approximately 75 to 80 % of the time. She states she is exercising 0 minutes 0 times per week. [name] continues to do well with weight loss. She has been planning her meals ahead of time and states hunger is well controlled. [name] states she has missed her menstrual cycle and is unsure if she is pregnant. She has not been using the Nuvaring at all times.  Her weight is 261 lb (118.4 kg) today and has had a weight loss of 2 pounds over a period of 3 weeks since her last visit. She has lost 14 lbs since starting treatment with [name].  Vitamin D deficiency [name] has a diagnosis of vitamin D deficiency. She is currently taking vit D and denies nausea, vomiting or muscle weakness.  Insulin Resistance [name] has a diagnosis of insulin resistance based on her elevated fasting insulin level >5. Although [name]'s blood glucose readings are still under good control, insulin resistance puts her at greater risk of metabolic syndrome and diabetes. She is taking metformin currently and continues to work on diet and exercise to decrease risk of diabetes. [name] denies polyphagia.  At risk for diabetes [name] is at higher than average risk for developing diabetes due to her obesity and insulin resistance. She currently denies polyuria or polydipsia.  Hypertriglyceridemia [name] has hypertriglyceridemia and is not on medications currently and she declines medications today. She has been trying to improve her cholesterol levels with intensive lifestyle modification including a low saturated fat diet, exercise and weight loss. She denies any chest pain, claudication or myalgias.  ALLERGIES: No Known Allergies  MEDICATIONS: Current Outpatient  Prescriptions on File Prior to Visit  Medication Sig Dispense Refill  . buPROPion (WELLBUTRIN SR) 200 MG 12 hr tablet Take 1 tablet (200 mg total) by mouth every morning. 30 tablet 0  . ergocalciferol (VITAMIN D2) 50000 units capsule Take 1 capsule (50,000 Units total) by mouth once a week. 4 capsule 0  . etonogestrel-ethinyl estradiol (NUVARING) 0.12-0.015 MG/24HR vaginal ring Place 1 each vaginally every 28 (twenty-eight) days. Insert vaginally and leave in place for 3 consecutive weeks, then remove for 1 week.    . fluticasone (FLONASE) 50 MCG/ACT nasal spray Place 2 sprays into both nostrils daily. 16 g 1  . metFORMIN (GLUCOPHAGE) 500 MG tablet Take 1 tablet (500 mg total) by mouth 2 (two) times daily with a meal. 60 tablet 0  . SUMAtriptan (IMITREX) 50 MG tablet One tab at start of migraine. May repeat in 2 hours if headache persists or recurs. 10 tablet 5   No current facility-administered medications on file prior to visit.     PAST MEDICAL HISTORY: Past Medical History:  Diagnosis Date  . ADD (attention deficit disorder)   . Anxiety   . Back pain   . Chest pain   . Constipation   . Depression   . Fatty liver   . Liver function abnormality    HIGH  . Migraines   . Obesity   . Vitamin D deficiency     PAST SURGICAL HISTORY: No past surgical history on file.  SOCIAL HISTORY: Social History  Substance Use Topics  . Smoking status: Former Smoker    Years: 4.00  .  Smokeless tobacco: Never Used  . Alcohol use Yes     Comment: occasional    FAMILY HISTORY: Family History  Problem Relation Age of Onset  . Diabetes Sister 5       Type 1  . Celiac disease Sister   . Diabetes Mellitus II Maternal Grandmother   . Factor V Leiden deficiency Maternal Grandmother   . Factor V Leiden deficiency Mother   . Obesity Mother   . Hyperlipidemia Father   . Sleep apnea Father   . Obesity Father   . Cancer Paternal Grandmother        breast  . Breast cancer Paternal Grandmother    . Heart Problems Sister        left ventricular non-compaction  . Diabetes Mellitus II Maternal Grandfather   . Cancer Paternal Grandfather        throat/throat and lung, smoker    ROS: Review of Systems  Constitutional: Positive for weight loss.  Cardiovascular: Negative for chest pain and claudication.  Gastrointestinal: Negative for nausea and vomiting.  Genitourinary: Negative for frequency.  Musculoskeletal: Negative for myalgias.       Negative muscle weakness  Endo/Heme/Allergies: Negative for polydipsia.       Negative polyphagia    PHYSICAL EXAM: Blood pressure 116/79, pulse 94, temperature 98.3 F (36.8 C), height 5' 10"  (1.778 m), weight 261 lb (118.4 kg), SpO2 96 %. Body mass index is 37.45 kg/m. Physical Exam  Constitutional: She is oriented to person, place, and time. She appears well-developed and well-nourished.  Cardiovascular: Normal rate.   Pulmonary/Chest: Effort normal.  Musculoskeletal: Normal range of motion.  Neurological: She is oriented to person, place, and time.  Skin: Skin is warm and dry.  Psychiatric: She has a normal mood and affect. Her behavior is normal.  Vitals reviewed.   RECENT LABS AND TESTS: BMET    Component Value Date/Time   NA 142 06/15/2017 0934   K 4.5 06/15/2017 0934   CL 106 06/15/2017 0934   CO2 20 06/15/2017 0934   GLUCOSE 97 06/15/2017 0934   GLUCOSE 101 (H) 08/26/2015 0751   BUN 13 06/15/2017 0934   CREATININE 0.79 06/15/2017 0934   CREATININE 0.79 05/30/2014 0859   CALCIUM 9.1 06/15/2017 0934   GFRNONAA 104 06/15/2017 0934   GFRNONAA >89 05/30/2014 0859   GFRAA 120 06/15/2017 0934   GFRAA >89 05/30/2014 0859   Lab Results  Component Value Date   HGBA1C 5.5 06/15/2017   HGBA1C 5.4 03/01/2017   Lab Results  Component Value Date   INSULIN 29.7 (H) 06/15/2017   INSULIN 32.5 (H) 03/01/2017   CBC    Component Value Date/Time   WBC 10.6 03/01/2017 1041   WBC 9.2 08/26/2015 0751   RBC 4.64 03/01/2017  1041   RBC 4.57 08/26/2015 0751   HGB 13.2 03/01/2017 1041   HCT 38.6 03/01/2017 1041   PLT 276.0 08/26/2015 0751   MCV 83 03/01/2017 1041   MCH 28.4 03/01/2017 1041   MCH 28.4 05/30/2014 0859   MCHC 34.2 03/01/2017 1041   MCHC 33.5 08/26/2015 0751   RDW 13.8 03/01/2017 1041   LYMPHSABS 2.3 03/01/2017 1041   MONOABS 0.7 08/26/2015 0751   EOSABS 0.1 03/01/2017 1041   BASOSABS 0.0 03/01/2017 1041   Iron/TIBC/Ferritin/ %Sat No results found for: IRON, TIBC, FERRITIN, IRONPCTSAT Lipid Panel     Component Value Date/Time   CHOL 160 06/15/2017 0934   TRIG 150 (H) 06/15/2017 0934   HDL 53 06/15/2017 0934  CHOLHDL 3 09/14/2016 0827   VLDL 34.2 09/14/2016 0827   LDLCALC 77 06/15/2017 0934   Hepatic Function Panel     Component Value Date/Time   PROT 6.7 06/15/2017 0934   ALBUMIN 3.9 06/15/2017 0934   AST 13 06/15/2017 0934   ALT 17 06/15/2017 0934   ALKPHOS 93 06/15/2017 0934   BILITOT 0.3 06/15/2017 0934   BILIDIR 0.1 08/26/2015 0751   IBILI 0.3 05/30/2014 0859      Component Value Date/Time   TSH 3.200 03/01/2017 1041   TSH 3.72 08/26/2015 0751   TSH 2.976 05/30/2014 0859    ASSESSMENT AND PLAN: Insulin resistance - Plan: Comprehensive metabolic panel, Hemoglobin A1c, Insulin, random  Vitamin D deficiency - Plan: VITAMIN D 25 Hydroxy (Vit-D Deficiency, Fractures)  Hypertriglyceridemia - Plan: Lipid Panel With LDL/HDL Ratio  At risk for diabetes mellitus  Class 2 severe obesity with serious comorbidity and body mass index (BMI) of 37.0 to 37.9 in adult, unspecified obesity type (HCC)  PLAN:  Vitamin D Deficiency Merit was informed that low vitamin D levels contributes to fatigue and are associated with obesity, breast, and colon cancer. She agrees to continue to take her medications as prescribed and we will check labs and will follow up for routine testing of vitamin D, at least 2-3 times per year. She was informed of the risk of over-replacement of vitamin D  and agrees to not increase her dose unless he discusses this with [name] first. Brelynn agrees to follow up with our clinic in 2 to 3 weeks.  Insulin Resistance Nelle will continue to work on weight loss, exercise, and decreasing simple carbohydrates in her diet to help decrease the risk of diabetes. We dicussed metformin including benefits and risks. She was informed that eating too many simple carbohydrates or too many calories at one sitting increases the likelihood of GI side effects. Tiahna agrees to continue metformin for now and prescription was not written today. We will check labs and Atheena agreed to follow up with [name] as directed to monitor her progress.  Diabetes risk counseling Zamariya was given extended (15 minutes) diabetes prevention counseling today. She is 26 y.o. female and has risk factors for diabetes including obesity and insulin resistance. We discussed intensive lifestyle modifications today with an emphasis on weight loss as well as increasing exercise and decreasing simple carbohydrates in her diet.  Hypertriglyceridemia Arabia was informed of the American Heart Association Guidelines emphasizing intensive lifestyle modifications as the first line treatment for hyperlipidemia. We discussed many lifestyle modifications today in depth, and Caylyn will continue to work on decreasing saturated fats such as fatty red meat, butter and many fried foods. She will also increase vegetables and lean protein in her diet and continue to work on exercise and weight loss efforts. We will check labs and Joslynn agrees to follow up as directed.  Obesity Annamary is currently in the action stage of change. As such, her goal is to continue with weight loss efforts She has agreed to keep a food journal with 1600 calories and 100 grams of protein daily Sheanna has been instructed to work up to a goal of 150 minutes of combined cardio and strengthening exercise per week for weight loss and overall health  benefits. We discussed the following Behavioral Modification Strategies today: keeping healthy foods in the home and increasing lean protein intake  Nilza has agreed to follow up with our clinic in 2 to 3 weeks. She was informed of the importance of frequent  follow up visits to maximize her success with intensive lifestyle modifications for her multiple health conditions.  I, Doreene Nest, am acting as transcriptionist for Lacy Duverney, PA-C  I have reviewed the above documentation for accuracy and completeness, and I agree with the above. -Lacy Duverney, PA-C  I have reviewed the above note and agree with the plan. -Dennard Nip, MD  OBESITY BEHAVIORAL INTERVENTION VISIT  Today's visit was # 14 out of 22.  Starting weight: 275 lbs Starting date: 03/01/17 Today's weight : 261 lbs Today's date: 09/20/2017 Total lbs lost to date: 14 (Patients must lose 7 lbs in the first 6 months to continue with counseling)   ASK: We discussed the diagnosis of obesity with Hulan Fray today and Quinnlyn agreed to give [name] permission to discuss obesity behavioral modification therapy today.  ASSESS: Nikola has the diagnosis of obesity and her BMI today is 37.45 Audreyana is in the action stage of change   ADVISE: Alexsus was educated on the multiple health risks of obesity as well as the benefit of weight loss to improve her health. She was advised of the need for long term treatment and the importance of lifestyle modifications.  AGREE: Multiple dietary modification options and treatment options were discussed and  Aminata agreed to keep a food journal with 1600 calories and 100 grams of protein daily We discussed the following Behavioral Modification Strategies today: keeping healthy foods the home and increasing lean protein intake

## 2017-09-21 ENCOUNTER — Encounter (INDEPENDENT_AMBULATORY_CARE_PROVIDER_SITE_OTHER): Payer: Self-pay | Admitting: Physician Assistant

## 2017-09-21 LAB — COMPREHENSIVE METABOLIC PANEL
A/G RATIO: 1.3 (ref 1.2–2.2)
ALT: 29 IU/L (ref 0–32)
AST: 19 IU/L (ref 0–40)
Albumin: 3.9 g/dL (ref 3.5–5.5)
Alkaline Phosphatase: 96 IU/L (ref 39–117)
BUN/Creatinine Ratio: 19 (ref 9–23)
BUN: 15 mg/dL (ref 6–20)
Bilirubin Total: 0.3 mg/dL (ref 0.0–1.2)
CALCIUM: 9.4 mg/dL (ref 8.7–10.2)
CHLORIDE: 106 mmol/L (ref 96–106)
CO2: 20 mmol/L (ref 20–29)
Creatinine, Ser: 0.79 mg/dL (ref 0.57–1.00)
GFR calc Af Amer: 120 mL/min/{1.73_m2} (ref >59)
GFR calc non Af Amer: 104 mL/min/{1.73_m2} (ref >59)
GLOBULIN, TOTAL: 3 g/dL (ref 1.5–4.5)
Glucose: 96 mg/dL (ref 65–99)
POTASSIUM: 4.1 mmol/L (ref 3.5–5.2)
SODIUM: 141 mmol/L (ref 134–144)
Total Protein: 6.9 g/dL (ref 6.0–8.5)

## 2017-09-21 LAB — LIPID PANEL WITH LDL/HDL RATIO
Cholesterol, Total: 179 mg/dL (ref 100–199)
HDL: 54 mg/dL (ref >39)
LDL CALC: 94 mg/dL (ref 0–99)
LDL/HDL RATIO: 1.7 ratio (ref 0.0–3.2)
TRIGLYCERIDES: 153 mg/dL — AB (ref 0–149)
VLDL Cholesterol Cal: 31 mg/dL (ref 5–40)

## 2017-09-21 LAB — INSULIN, RANDOM: INSULIN: 30.4 u[IU]/mL — ABNORMAL HIGH (ref 2.6–24.9)

## 2017-09-21 LAB — HEMOGLOBIN A1C
Est. average glucose Bld gHb Est-mCnc: 111 mg/dL
HEMOGLOBIN A1C: 5.5 % (ref 4.8–5.6)

## 2017-09-21 LAB — VITAMIN D 25 HYDROXY (VIT D DEFICIENCY, FRACTURES): VIT D 25 HYDROXY: 33.7 ng/mL (ref 30.0–100.0)

## 2017-10-04 ENCOUNTER — Ambulatory Visit (INDEPENDENT_AMBULATORY_CARE_PROVIDER_SITE_OTHER): Payer: 59 | Admitting: Physician Assistant

## 2017-10-04 VITALS — BP 115/78 | HR 77 | Temp 97.5°F | Ht 70.0 in | Wt 263.0 lb

## 2017-10-04 DIAGNOSIS — Z9189 Other specified personal risk factors, not elsewhere classified: Secondary | ICD-10-CM

## 2017-10-04 DIAGNOSIS — E559 Vitamin D deficiency, unspecified: Secondary | ICD-10-CM | POA: Diagnosis not present

## 2017-10-04 DIAGNOSIS — Z6837 Body mass index (BMI) 37.0-37.9, adult: Secondary | ICD-10-CM | POA: Diagnosis not present

## 2017-10-04 DIAGNOSIS — E8881 Metabolic syndrome: Secondary | ICD-10-CM

## 2017-10-04 MED ORDER — ERGOCALCIFEROL 1.25 MG (50000 UT) PO CAPS: 50000.0000 [IU] | ORAL_CAPSULE | ORAL | 0 refills | Status: DC

## 2017-10-04 MED FILL — VIT D2 1.25 MG (50,000 UNIT: 1.25 MG | 28 days supply | Qty: 4 | Fill #0

## 2017-10-04 NOTE — Progress Notes (Signed)
Office: [phone]  /  Fax: [phone]   HPI:   Chief Complaint: OBESITY Kathryn Huff is here to discuss her progress with her obesity treatment plan. She is on the keep a food journal with 1600 calories and 100 grams of protein daily and is following her eating plan approximately 75 % of the time. She states she is exercising cardio 30 minutes 3 times per week. Kathryn Huff states keeping a food journal gives her too much freedom and she would rather be on a structured meal plan. She states she has also been drinking more calorie liquids.   Her weight is 263 lb (119.3 kg) today and has gained 2 pounds since her last visit. She has lost 12 lbs since starting treatment with [name].  Vitamin D deficiency Kathryn Huff has a diagnosis of vitamin D deficiency. She is currently taking prescription Vit D and denies nausea, vomiting or muscle weakness.  Insulin Resistance Kathryn Huff has a diagnosis of insulin resistance based on her elevated fasting insulin level >5. Although Kathryn Huff's blood glucose readings are still under good control, insulin resistance puts her at greater risk of metabolic syndrome and diabetes. She is taking metformin currently and continues to work on diet and exercise to decrease risk of diabetes. She denies polyphagia.  At risk for diabetes Kathryn Huff is at higher than average risk for developing diabetes due to her obesity and insulin resistance. She currently denies polyuria or polydipsia.  ALLERGIES: No Known Allergies  MEDICATIONS: Current Outpatient Prescriptions on File Prior to Visit  Medication Sig Dispense Refill  . buPROPion (WELLBUTRIN SR) 200 MG 12 hr tablet Take 1 tablet (200 mg total) by mouth every morning. 30 tablet 0  . etonogestrel-ethinyl estradiol (NUVARING) 0.12-0.015 MG/24HR vaginal ring Place 1 each vaginally every 28 (twenty-eight) days. Insert vaginally and leave in place for 3 consecutive weeks, then remove for 1 week.    . fluticasone (FLONASE) 50 MCG/ACT nasal spray  Place 2 sprays into both nostrils daily. 16 g 1  . metFORMIN (GLUCOPHAGE) 500 MG tablet Take 1 tablet (500 mg total) by mouth 2 (two) times daily with a meal. 60 tablet 0  . SUMAtriptan (IMITREX) 50 MG tablet One tab at start of migraine. May repeat in 2 hours if headache persists or recurs. 10 tablet 5   No current facility-administered medications on file prior to visit.     PAST MEDICAL HISTORY: Past Medical History:  Diagnosis Date  . ADD (attention deficit disorder)   . Anxiety   . Back pain   . Chest pain   . Constipation   . Depression   . Fatty liver   . Liver function abnormality    HIGH  . Migraines   . Obesity   . Vitamin D deficiency     PAST SURGICAL HISTORY: No past surgical history on file.  SOCIAL HISTORY: Social History  Substance Use Topics  . Smoking status: Former Smoker    Years: 4.00  . Smokeless tobacco: Never Used  . Alcohol use Yes     Comment: occasional    FAMILY HISTORY: Family History  Problem Relation Age of Onset  . Diabetes Sister 5       Type 1  . Celiac disease Sister   . Diabetes Mellitus II Maternal Grandmother   . Factor V Leiden deficiency Maternal Grandmother   . Factor V Leiden deficiency Mother   . Obesity Mother   . Hyperlipidemia Father   . Sleep apnea Father   . Obesity Father   .  Cancer Paternal Grandmother        breast  . Breast cancer Paternal Grandmother   . Heart Problems Sister        left ventricular non-compaction  . Diabetes Mellitus II Maternal Grandfather   . Cancer Paternal Grandfather        throat/throat and lung, smoker    ROS: Review of Systems  Constitutional: Negative for weight loss.  Gastrointestinal: Negative for nausea and vomiting.  Genitourinary: Negative for frequency.  Musculoskeletal:       Negative muscle weakness  Endo/Heme/Allergies: Negative for polydipsia.       Negative polyphagia    PHYSICAL EXAM: Blood pressure 115/78, pulse 77, temperature (!) 97.5 F (36.4 C),  temperature source Oral, height 5' 10"  (1.778 m), weight 263 lb (119.3 kg), last menstrual period 09/20/2017, SpO2 99 %. Body mass index is 37.74 kg/m. Physical Exam  Constitutional: She is oriented to person, place, and time. She appears well-developed and well-nourished.  Cardiovascular: Normal rate.   Pulmonary/Chest: Effort normal.  Musculoskeletal: Normal range of motion.  Neurological: She is oriented to person, place, and time.  Skin: Skin is warm and dry.  Psychiatric: She has a normal mood and affect. Her behavior is normal.  Vitals reviewed.   RECENT LABS AND TESTS: BMET    Component Value Date/Time   NA 141 09/20/2017 0940   K 4.1 09/20/2017 0940   CL 106 09/20/2017 0940   CO2 20 09/20/2017 0940   GLUCOSE 96 09/20/2017 0940   GLUCOSE 101 (H) 08/26/2015 0751   BUN 15 09/20/2017 0940   CREATININE 0.79 09/20/2017 0940   CREATININE 0.79 05/30/2014 0859   CALCIUM 9.4 09/20/2017 0940   GFRNONAA 104 09/20/2017 0940   GFRNONAA >89 05/30/2014 0859   GFRAA 120 09/20/2017 0940   GFRAA >89 05/30/2014 0859   Lab Results  Component Value Date   HGBA1C 5.5 09/20/2017   HGBA1C 5.5 06/15/2017   HGBA1C 5.4 03/01/2017   Lab Results  Component Value Date   INSULIN 30.4 (H) 09/20/2017   INSULIN 29.7 (H) 06/15/2017   INSULIN 32.5 (H) 03/01/2017   CBC    Component Value Date/Time   WBC 10.6 03/01/2017 1041   WBC 9.2 08/26/2015 0751   RBC 4.64 03/01/2017 1041   RBC 4.57 08/26/2015 0751   HGB 13.2 03/01/2017 1041   HCT 38.6 03/01/2017 1041   PLT 276.0 08/26/2015 0751   MCV 83 03/01/2017 1041   MCH 28.4 03/01/2017 1041   MCH 28.4 05/30/2014 0859   MCHC 34.2 03/01/2017 1041   MCHC 33.5 08/26/2015 0751   RDW 13.8 03/01/2017 1041   LYMPHSABS 2.3 03/01/2017 1041   MONOABS 0.7 08/26/2015 0751   EOSABS 0.1 03/01/2017 1041   BASOSABS 0.0 03/01/2017 1041   Iron/TIBC/Ferritin/ %Sat No results found for: IRON, TIBC, FERRITIN, IRONPCTSAT Lipid Panel     Component Value  Date/Time   CHOL 179 09/20/2017 0940   TRIG 153 (H) 09/20/2017 0940   HDL 54 09/20/2017 0940   CHOLHDL 3 09/14/2016 0827   VLDL 34.2 09/14/2016 0827   LDLCALC 94 09/20/2017 0940   Hepatic Function Panel     Component Value Date/Time   PROT 6.9 09/20/2017 0940   ALBUMIN 3.9 09/20/2017 0940   AST 19 09/20/2017 0940   ALT 29 09/20/2017 0940   ALKPHOS 96 09/20/2017 0940   BILITOT 0.3 09/20/2017 0940   BILIDIR 0.1 08/26/2015 0751   IBILI 0.3 05/30/2014 0859      Component Value Date/Time  TSH 3.200 03/01/2017 1041   TSH 3.72 08/26/2015 0751   TSH 2.976 05/30/2014 0859    ASSESSMENT AND PLAN: Vitamin D deficiency - Plan: ergocalciferol (VITAMIN D2) 50000 units capsule  Insulin resistance  At risk for diabetes mellitus  Class 2 severe obesity with serious comorbidity and body mass index (BMI) of 37.0 to 37.9 in adult, unspecified obesity type ([place])  PLAN:  Vitamin D Deficiency Kathryn Huff was informed that low vitamin D levels contributes to fatigue and are associated with obesity, breast, and colon cancer. She agrees to continue taking prescription Vit D @50 ,000 IU every week #4 and we will refill for 1 month. She will follow up for routine testing of vitamin D, at least 2-3 times per year. She was informed of the risk of over-replacement of vitamin D and agrees to not increase her dose unless he discusses this with [name] first. Kathryn Huff agrees to follow up with our clinic in 2 to 3 weeks.  Insulin Resistance Kathryn Huff will continue to work on weight loss, exercise, and decreasing simple carbohydrates in her diet to help decrease the risk of diabetes. We dicussed metformin including benefits and risks. She was informed that eating too many simple carbohydrates or too many calories at one sitting increases the likelihood of GI side effects. Kathryn Huff agrees to continue taking metformin and prescription was not written today. Kathryn Huff agrees to follow up with our clinic in 2 to 3 weeks as directed  to monitor her progress.  Diabetes risk counselling Kathryn Huff was given extended (15 minutes) diabetes prevention counseling today. She is 26 y.o. female and has risk factors for diabetes including obesity and insulin resistance. We discussed intensive lifestyle modifications today with an emphasis on weight loss as well as increasing exercise and decreasing simple carbohydrates in her diet.  Obesity Kathryn Huff is currently in the action stage of change. As such, her goal is to continue with weight loss efforts She has agreed to follow the Category 3 plan Kathryn Huff has been instructed to work up to a goal of 150 minutes of combined cardio and strengthening exercise per week for weight loss and overall health benefits. We discussed the following Behavioral Modification Strategies today: increasing lean protein intake and decrease liquid calories   Kathryn Huff has agreed to follow up with our clinic in 2 to 3 weeks. She was informed of the importance of frequent follow up visits to maximize her success with intensive lifestyle modifications for her multiple health conditions.  I, Trixie Dredge, am acting as transcriptionist for Lacy Duverney, PA-C  I have reviewed the above documentation for accuracy and completeness, and I agree with the above. -Lacy Duverney, PA-C  I have reviewed the above note and agree with the plan. -Dennard Nip, MD     Today's visit was # 15 out of 22.  Starting weight: 275 lbs Starting date: 03/01/17 Today's weight : 263 lbs  Today's date: 10/04/2017 Total lbs lost to date: 12 (Patients must lose 7 lbs in the first 6 months to continue with counseling)   ASK: We discussed the diagnosis of obesity with Kathryn Huff today and Kathryn Huff agreed to give [name] permission to discuss obesity behavioral modification therapy today.  ASSESS: Kathryn Huff has the diagnosis of obesity and her BMI today is 33 Kathryn Huff is in the action stage of change   ADVISE: Kathryn Huff was educated on the multiple  health risks of obesity as well as the benefit of weight loss to improve her health. She was advised of the need for  long term treatment and the importance of lifestyle modifications.  AGREE: Multiple dietary modification options and treatment options were discussed and  Kathryn Huff agreed to follow the Category 3 plan We discussed the following Behavioral Modification Strategies today: increasing lean protein intake and decrease liquid calories

## 2017-10-17 ENCOUNTER — Ambulatory Visit (INDEPENDENT_AMBULATORY_CARE_PROVIDER_SITE_OTHER): Payer: 59 | Admitting: Physician Assistant

## 2017-10-17 VITALS — BP 118/78 | HR 87 | Temp 98.1°F | Ht 70.0 in | Wt 261.0 lb

## 2017-10-17 DIAGNOSIS — Z6837 Body mass index (BMI) 37.0-37.9, adult: Secondary | ICD-10-CM | POA: Diagnosis not present

## 2017-10-17 DIAGNOSIS — R7303 Prediabetes: Secondary | ICD-10-CM | POA: Diagnosis not present

## 2017-10-17 NOTE — Progress Notes (Signed)
Office: [phone]  /  Fax: [phone]   HPI:   Chief Complaint: OBESITY Coy is here to discuss her progress with her obesity treatment plan. She is on the Category 3 plan and is following her eating plan approximately 75 % of the time. She states she is walking for 20 minutes 3 times per week. Elcie continues to do well with weight loss. She is mindful of her eating and continues to makes smarter food choices. She states meal planning is  becoming easier and she would like more meal planning ideas.  Her weight is 261 lb (118.4 kg) today and has had a weight loss of 2 pounds over a period of 2 weeks since her last visit. She has lost 14 lbs since starting treatment with [name].  Pre-Diabetes Makenli has a diagnosis of pre-diabetes based on her elevated Hgb A1c and was informed this puts her at greater risk of developing diabetes. She is taking metformin currently and continues to work on diet and exercise to decrease risk of diabetes. She denies nausea, polyphagia or hypoglycemia.   ALLERGIES: No Known Allergies  MEDICATIONS: Current Outpatient Prescriptions on File Prior to Visit  Medication Sig Dispense Refill  . buPROPion (WELLBUTRIN SR) 200 MG 12 hr tablet Take 1 tablet (200 mg total) by mouth every morning. 30 tablet 0  . ergocalciferol (VITAMIN D2) 50000 units capsule Take 1 capsule (50,000 Units total) by mouth once a week. 4 capsule 0  . etonogestrel-ethinyl estradiol (NUVARING) 0.12-0.015 MG/24HR vaginal ring Place 1 each vaginally every 28 (twenty-eight) days. Insert vaginally and leave in place for 3 consecutive weeks, then remove for 1 week.    . fluticasone (FLONASE) 50 MCG/ACT nasal spray Place 2 sprays into both nostrils daily. 16 g 1  . metFORMIN (GLUCOPHAGE) 500 MG tablet Take 1 tablet (500 mg total) by mouth 2 (two) times daily with a meal. 60 tablet 0  . SUMAtriptan (IMITREX) 50 MG tablet One tab at start of migraine. May repeat in 2 hours if headache persists or  recurs. 10 tablet 5   No current facility-administered medications on file prior to visit.     PAST MEDICAL HISTORY: Past Medical History:  Diagnosis Date  . ADD (attention deficit disorder)   . Anxiety   . Back pain   . Chest pain   . Constipation   . Depression   . Fatty liver   . Liver function abnormality    HIGH  . Migraines   . Obesity   . Vitamin D deficiency     PAST SURGICAL HISTORY: No past surgical history on file.  SOCIAL HISTORY: Social History  Substance Use Topics  . Smoking status: Former Smoker    Years: 4.00  . Smokeless tobacco: Never Used  . Alcohol use Yes     Comment: occasional    FAMILY HISTORY: Family History  Problem Relation Age of Onset  . Diabetes Sister 5       Type 1  . Celiac disease Sister   . Diabetes Mellitus II Maternal Grandmother   . Factor V Leiden deficiency Maternal Grandmother   . Factor V Leiden deficiency Mother   . Obesity Mother   . Hyperlipidemia Father   . Sleep apnea Father   . Obesity Father   . Cancer Paternal Grandmother        breast  . Breast cancer Paternal Grandmother   . Heart Problems Sister        left ventricular non-compaction  . Diabetes  Mellitus II Maternal Grandfather   . Cancer Paternal Grandfather        throat/throat and lung, smoker    ROS: Review of Systems  Constitutional: Positive for weight loss.  Gastrointestinal: Negative for nausea.  Endo/Heme/Allergies:       Negative polyphagia Negative hypoglycemia    PHYSICAL EXAM: Blood pressure 118/78, pulse 87, temperature 98.1 F (36.7 C), temperature source Oral, height 5' 10"  (1.778 m), weight 261 lb (118.4 kg), last menstrual period 09/20/2017, SpO2 99 %. Body mass index is 37.45 kg/m. Physical Exam  Constitutional: She is oriented to person, place, and time. She appears well-developed and well-nourished.  Cardiovascular: Normal rate.   Pulmonary/Chest: Effort normal.  Musculoskeletal: Normal range of motion.    Neurological: She is oriented to person, place, and time.  Skin: Skin is warm and dry.  Psychiatric: She has a normal mood and affect. Her behavior is normal.  Vitals reviewed.   RECENT LABS AND TESTS: BMET    Component Value Date/Time   NA 141 09/20/2017 0940   K 4.1 09/20/2017 0940   CL 106 09/20/2017 0940   CO2 20 09/20/2017 0940   GLUCOSE 96 09/20/2017 0940   GLUCOSE 101 (H) 08/26/2015 0751   BUN 15 09/20/2017 0940   CREATININE 0.79 09/20/2017 0940   CREATININE 0.79 05/30/2014 0859   CALCIUM 9.4 09/20/2017 0940   GFRNONAA 104 09/20/2017 0940   GFRNONAA >89 05/30/2014 0859   GFRAA 120 09/20/2017 0940   GFRAA >89 05/30/2014 0859   Lab Results  Component Value Date   HGBA1C 5.5 09/20/2017   HGBA1C 5.5 06/15/2017   HGBA1C 5.4 03/01/2017   Lab Results  Component Value Date   INSULIN 30.4 (H) 09/20/2017   INSULIN 29.7 (H) 06/15/2017   INSULIN 32.5 (H) 03/01/2017   CBC    Component Value Date/Time   WBC 10.6 03/01/2017 1041   WBC 9.2 08/26/2015 0751   RBC 4.64 03/01/2017 1041   RBC 4.57 08/26/2015 0751   HGB 13.2 03/01/2017 1041   HCT 38.6 03/01/2017 1041   PLT 276.0 08/26/2015 0751   MCV 83 03/01/2017 1041   MCH 28.4 03/01/2017 1041   MCH 28.4 05/30/2014 0859   MCHC 34.2 03/01/2017 1041   MCHC 33.5 08/26/2015 0751   RDW 13.8 03/01/2017 1041   LYMPHSABS 2.3 03/01/2017 1041   MONOABS 0.7 08/26/2015 0751   EOSABS 0.1 03/01/2017 1041   BASOSABS 0.0 03/01/2017 1041   Iron/TIBC/Ferritin/ %Sat No results found for: IRON, TIBC, FERRITIN, IRONPCTSAT Lipid Panel     Component Value Date/Time   CHOL 179 09/20/2017 0940   TRIG 153 (H) 09/20/2017 0940   HDL 54 09/20/2017 0940   CHOLHDL 3 09/14/2016 0827   VLDL 34.2 09/14/2016 0827   LDLCALC 94 09/20/2017 0940   Hepatic Function Panel     Component Value Date/Time   PROT 6.9 09/20/2017 0940   ALBUMIN 3.9 09/20/2017 0940   AST 19 09/20/2017 0940   ALT 29 09/20/2017 0940   ALKPHOS 96 09/20/2017 0940    BILITOT 0.3 09/20/2017 0940   BILIDIR 0.1 08/26/2015 0751   IBILI 0.3 05/30/2014 0859      Component Value Date/Time   TSH 3.200 03/01/2017 1041   TSH 3.72 08/26/2015 0751   TSH 2.976 05/30/2014 0859    ASSESSMENT AND PLAN: Prediabetes  Class 2 severe obesity with serious comorbidity and body mass index (BMI) of 37.0 to 37.9 in adult, unspecified obesity type ([place])  PLAN:  Pre-Diabetes Lashunta will continue to  work on weight loss, exercise, and decreasing simple carbohydrates in her diet to help decrease the risk of diabetes. We dicussed metformin including benefits and risks. She was informed that eating too many simple carbohydrates or too many calories at one sitting increases the likelihood of GI side effects. Merion agrees to continue metformin for now and a prescription was not written today. Nashalie agreed to follow up with [name] as directed to monitor her progress.  We spent > than 50% of the 15 minute visit on the counseling as documented in the note.  Obesity Hillery is currently in the action stage of change. As such, her goal is to continue with weight loss efforts She has agreed to keep a food journal with 1600 calories and 100 grams of protein daily Daphnee has been instructed to work up to a goal of 150 minutes of combined cardio and strengthening exercise per week for weight loss and overall health benefits. We discussed the following Behavioral Modification Strategies today: increasing lean protein intake and work on meal planning and easy cooking plans  Dionna has agreed to follow up with our clinic in 2 weeks. She was informed of the importance of frequent follow up visits to maximize her success with intensive lifestyle modifications for her multiple health conditions.  I, Doreene Nest, am acting as transcriptionist for Lacy Duverney, PA-C  I have reviewed the above documentation for accuracy and completeness, and I agree with the above. -Lacy Duverney, PA-C  I have  reviewed the above note and agree with the plan. -Dennard Nip, MD   OBESITY BEHAVIORAL INTERVENTION VISIT  Today's visit was # 16 out of 22.  Starting weight: 275 lbs Starting date: 03/01/17 Today's weight : 261 lbs  Today's date: 10/17/2017 Total lbs lost to date: 14 (Patients must lose 7 lbs in the first 6 months to continue with counseling)   ASK: We discussed the diagnosis of obesity with Hulan Fray today and Bali agreed to give [name] permission to discuss obesity behavioral modification therapy today.  ASSESS: Valori has the diagnosis of obesity and her BMI today is 37.45 Tikisha is in the action stage of change   ADVISE: Aniesa was educated on the multiple health risks of obesity as well as the benefit of weight loss to improve her health. She was advised of the need for long term treatment and the importance of lifestyle modifications.  AGREE: Multiple dietary modification options and treatment options were discussed and  Cynthia agreed to keep a food journal with 1600 calories and 100 grams of protein daily We discussed the following Behavioral Modification Strategies today: increasing lean protein intake and work on meal planning and easy cooking plans

## 2017-10-31 ENCOUNTER — Ambulatory Visit (INDEPENDENT_AMBULATORY_CARE_PROVIDER_SITE_OTHER): Payer: 59 | Admitting: Physician Assistant

## 2017-10-31 VITALS — BP 113/78 | HR 86 | Temp 98.2°F | Ht 70.0 in | Wt 263.0 lb

## 2017-10-31 DIAGNOSIS — Z6837 Body mass index (BMI) 37.0-37.9, adult: Secondary | ICD-10-CM

## 2017-10-31 DIAGNOSIS — E559 Vitamin D deficiency, unspecified: Secondary | ICD-10-CM

## 2017-10-31 NOTE — Progress Notes (Signed)
Office: [phone]  /  Fax: [phone]   HPI:   Chief Complaint: OBESITY Kathryn Huff is here to discuss her progress with her obesity treatment plan. She is on the keep a food journal with 1600 calories and 100 grams of protein daily and is following her eating plan approximately 80 % of the time. She states she is walking 30 minutes 3 times per week. Kathryn Huff is worried about her sick aunt and has had increased emotional eating. She also skips some of her meals on occasions. She would like emotional eating strategies. Her weight is 263 lb (119.3 kg) today and has gained 2 pounds since her last visit. She has lost 12 lbs since starting treatment with [name].  Vitamin D deficiency Kathryn Huff has a diagnosis of vitamin D deficiency. She is currently taking prescription Vit D and denies nausea, vomiting or muscle weakness.  ALLERGIES: No Known Allergies  MEDICATIONS: Current Outpatient Medications on File Prior to Visit  Medication Sig Dispense Refill  . buPROPion (WELLBUTRIN SR) 200 MG 12 hr tablet Take 1 tablet (200 mg total) by mouth every morning. 30 tablet 0  . ergocalciferol (VITAMIN D2) 50000 units capsule Take 1 capsule (50,000 Units total) by mouth once a week. 4 capsule 0  . etonogestrel-ethinyl estradiol (NUVARING) 0.12-0.015 MG/24HR vaginal ring Place 1 each vaginally every 28 (twenty-eight) days. Insert vaginally and leave in place for 3 consecutive weeks, then remove for 1 week.    . fluticasone (FLONASE) 50 MCG/ACT nasal spray Place 2 sprays into both nostrils daily. 16 g 1  . metFORMIN (GLUCOPHAGE) 500 MG tablet Take 1 tablet (500 mg total) by mouth 2 (two) times daily with a meal. 60 tablet 0  . SUMAtriptan (IMITREX) 50 MG tablet One tab at start of migraine. May repeat in 2 hours if headache persists or recurs. 10 tablet 5   No current facility-administered medications on file prior to visit.     PAST MEDICAL HISTORY: Past Medical History:  Diagnosis Date  . ADD (attention  deficit disorder)   . Anxiety   . Back pain   . Chest pain   . Constipation   . Depression   . Fatty liver   . Liver function abnormality    HIGH  . Migraines   . Obesity   . Vitamin D deficiency     PAST SURGICAL HISTORY: No past surgical history on file.  SOCIAL HISTORY: Social History   Tobacco Use  . Smoking status: Former Smoker    Years: 4.00  . Smokeless tobacco: Never Used  Substance Use Topics  . Alcohol use: Yes    Comment: occasional  . Drug use: No    FAMILY HISTORY: Family History  Problem Relation Age of Onset  . Diabetes Sister 5       Type 1  . Celiac disease Sister   . Diabetes Mellitus II Maternal Grandmother   . Factor V Leiden deficiency Maternal Grandmother   . Factor V Leiden deficiency Mother   . Obesity Mother   . Hyperlipidemia Father   . Sleep apnea Father   . Obesity Father   . Cancer Paternal Grandmother        breast  . Breast cancer Paternal Grandmother   . Heart Problems Sister        left ventricular non-compaction  . Diabetes Mellitus II Maternal Grandfather   . Cancer Paternal Grandfather        throat/throat and lung, smoker    ROS: Review of Systems  Constitutional: Negative for weight loss.  Gastrointestinal: Negative for nausea and vomiting.  Musculoskeletal:       Negative muscle weakness     PHYSICAL EXAM: Blood pressure 113/78, pulse 86, temperature 98.2 F (36.8 C), temperature source Oral, height 5' 10"  (1.778 m), weight 263 lb (119.3 kg), SpO2 99 %. Body mass index is 37.74 kg/m. Physical Exam  Constitutional: She is oriented to person, place, and time. She appears well-developed and well-nourished.  Cardiovascular: Normal rate.  Pulmonary/Chest: Effort normal.  Musculoskeletal: Normal range of motion.  Neurological: She is oriented to person, place, and time.  Skin: Skin is warm and dry.  Psychiatric: She has a normal mood and affect. Her behavior is normal.  Vitals reviewed.   RECENT LABS AND  TESTS: BMET    Component Value Date/Time   NA 141 09/20/2017 0940   K 4.1 09/20/2017 0940   CL 106 09/20/2017 0940   CO2 20 09/20/2017 0940   GLUCOSE 96 09/20/2017 0940   GLUCOSE 101 (H) 08/26/2015 0751   BUN 15 09/20/2017 0940   CREATININE 0.79 09/20/2017 0940   CREATININE 0.79 05/30/2014 0859   CALCIUM 9.4 09/20/2017 0940   GFRNONAA 104 09/20/2017 0940   GFRNONAA >89 05/30/2014 0859   GFRAA 120 09/20/2017 0940   GFRAA >89 05/30/2014 0859   Lab Results  Component Value Date   HGBA1C 5.5 09/20/2017   HGBA1C 5.5 06/15/2017   HGBA1C 5.4 03/01/2017   Lab Results  Component Value Date   INSULIN 30.4 (H) 09/20/2017   INSULIN 29.7 (H) 06/15/2017   INSULIN 32.5 (H) 03/01/2017   CBC    Component Value Date/Time   WBC 10.6 03/01/2017 1041   WBC 9.2 08/26/2015 0751   RBC 4.64 03/01/2017 1041   RBC 4.57 08/26/2015 0751   HGB 13.2 03/01/2017 1041   HCT 38.6 03/01/2017 1041   PLT 276.0 08/26/2015 0751   MCV 83 03/01/2017 1041   MCH 28.4 03/01/2017 1041   MCH 28.4 05/30/2014 0859   MCHC 34.2 03/01/2017 1041   MCHC 33.5 08/26/2015 0751   RDW 13.8 03/01/2017 1041   LYMPHSABS 2.3 03/01/2017 1041   MONOABS 0.7 08/26/2015 0751   EOSABS 0.1 03/01/2017 1041   BASOSABS 0.0 03/01/2017 1041   Iron/TIBC/Ferritin/ %Sat No results found for: IRON, TIBC, FERRITIN, IRONPCTSAT Lipid Panel     Component Value Date/Time   CHOL 179 09/20/2017 0940   TRIG 153 (H) 09/20/2017 0940   HDL 54 09/20/2017 0940   CHOLHDL 3 09/14/2016 0827   VLDL 34.2 09/14/2016 0827   LDLCALC 94 09/20/2017 0940   Hepatic Function Panel     Component Value Date/Time   PROT 6.9 09/20/2017 0940   ALBUMIN 3.9 09/20/2017 0940   AST 19 09/20/2017 0940   ALT 29 09/20/2017 0940   ALKPHOS 96 09/20/2017 0940   BILITOT 0.3 09/20/2017 0940   BILIDIR 0.1 08/26/2015 0751   IBILI 0.3 05/30/2014 0859      Component Value Date/Time   TSH 3.200 03/01/2017 1041   TSH 3.72 08/26/2015 0751   TSH 2.976 05/30/2014  0859    ASSESSMENT AND PLAN: Vitamin D deficiency  Class 2 severe obesity with serious comorbidity and body mass index (BMI) of 37.0 to 37.9 in adult, unspecified obesity type (HCC)  PLAN:  Vitamin D Deficiency Kathryn Huff was informed that low vitamin D levels contributes to fatigue and are associated with obesity, breast, and colon cancer. Kathryn Huff to continue taking prescription Vit D @50 ,000 IU every week #4  and will follow up for routine testing of vitamin D, at least 2-3 times per year. She was informed of the risk of over-replacement of vitamin D and Huff to not increase her dose unless he discusses this with [name] first. Kathryn Huff Huff to follow up with our clinic in 3 weeks.  We spent > than 50% of the 15 minute visit on the counseling as documented in the note.  Obesity Kathryn Huff is currently in the action stage of change. As such, her goal is to continue with weight loss efforts She has agreed to keep a food journal with 1600 calories and 100 grams of protein daily Kathryn Huff has been instructed to work up to a goal of 150 minutes of combined cardio and strengthening exercise per week for weight loss and overall health benefits. We discussed the following Behavioral Modification Strategies today: increasing lean protein intake, holiday eating strategies  and emotional eating strategies   Kathryn Huff has agreed to follow up with our clinic in 3 weeks. She was informed of the importance of frequent follow up visits to maximize her success with intensive lifestyle modifications for her multiple health conditions.  I, Trixie Dredge, am acting as transcriptionist for Kathryn Duverney, PA-C  I have reviewed the above documentation for accuracy and completeness, and I agree with the above. -Kathryn Duverney, PA-C  I have reviewed the above note and agree with the plan. -Kathryn Nip, MD      Today's visit was # 17 out of 22.  Starting weight: 275 lbs Starting date: 03/01/17 Today's weight : 263  lbs  Today's date: 10/31/2017 Total lbs lost to date: 12 (Patients must lose 7 lbs in the first 6 months to continue with counseling)   ASK: We discussed the diagnosis of obesity with Kathryn Huff today and Kathryn Huff agreed to give [name] permission to discuss obesity behavioral modification therapy today.  ASSESS: Kathryn Huff has the diagnosis of obesity and her BMI today is 37.74 Kathryn Huff is in the action stage of change   ADVISE: Kathryn Huff was educated on the multiple health risks of obesity as well as the benefit of weight loss to improve her health. She was advised of the need for long term treatment and the importance of lifestyle modifications.  AGREE: Multiple dietary modification options and treatment options were discussed and  Kathryn Huff agreed to keep a food journal with 1600 calories and 100 grams of protein daily We discussed the following Behavioral Modification Strategies today: increasing lean protein intake, holiday eating strategies  and emotional eating strategies

## 2017-11-11 ENCOUNTER — Encounter: Payer: Self-pay | Admitting: Family

## 2017-11-11 DIAGNOSIS — R140 Abdominal distension (gaseous): Secondary | ICD-10-CM

## 2017-11-11 DIAGNOSIS — R197 Diarrhea, unspecified: Secondary | ICD-10-CM

## 2017-11-13 ENCOUNTER — Encounter: Payer: Self-pay | Admitting: Physician Assistant

## 2017-11-16 MED FILL — NUVARING VAGINAL RING: 0.12-0.015 | 84 days supply | Qty: 3 | Fill #3

## 2017-11-20 ENCOUNTER — Ambulatory Visit (INDEPENDENT_AMBULATORY_CARE_PROVIDER_SITE_OTHER): Payer: 59 | Admitting: Physician Assistant

## 2017-11-20 VITALS — BP 105/73 | HR 98 | Temp 98.3°F | Ht 70.0 in | Wt 266.0 lb

## 2017-11-20 DIAGNOSIS — F3289 Other specified depressive episodes: Secondary | ICD-10-CM

## 2017-11-20 DIAGNOSIS — E559 Vitamin D deficiency, unspecified: Secondary | ICD-10-CM

## 2017-11-20 DIAGNOSIS — Z6838 Body mass index (BMI) 38.0-38.9, adult: Secondary | ICD-10-CM

## 2017-11-20 DIAGNOSIS — Z9189 Other specified personal risk factors, not elsewhere classified: Secondary | ICD-10-CM

## 2017-11-20 MED ORDER — BUPROPION HCL ER (SR) 200 MG PO TB12: 200.0000 mg | ORAL_TABLET | Freq: Every morning | ORAL | 0 refills | Status: DC

## 2017-11-20 MED ORDER — ERGOCALCIFEROL 1.25 MG (50000 UT) PO CAPS: 50000.0000 [IU] | ORAL_CAPSULE | ORAL | 0 refills | Status: DC

## 2017-11-20 NOTE — Progress Notes (Signed)
Office: [phone]  /  Fax: [phone]   HPI:   Chief Complaint: OBESITY Kathryn Huff is here to discuss her progress with her obesity treatment plan. She is on the keep a food journal with 1600 calories and 100 grams of protein daily and is following her eating plan approximately 60 % of the time. She states she is walking for 30 minutes 3 times per week. Kathryn Huff states she has been skipping meals on occasion and she ends up overeating. She has not been mindful of her eating, and states she has had increased cravings. Her weight is 266 lb (120.7 kg) today and has had a weight gain of 3 pounds over a period of 3 weeks since her last visit. She has lost 9 lbs since starting treatment with [name].  Vitamin D deficiency Kathryn Huff has a diagnosis of vitamin D deficiency. She is currently taking vit D and denies nausea, vomiting or muscle weakness.  At risk for osteopenia and osteoporosis Kathryn Huff is at higher risk of osteopenia and osteoporosis due to vitamin D deficiency.   Depression with emotional eating behaviors Kathryn Huff is struggling with emotional eating and using food for comfort to the extent that it is negatively impacting her health. She often snacks when she is not hungry. Kathryn Huff sometimes feels she is out of control and then feels guilty that she made poor food choices. She has been working on behavior modification techniques to help reduce her emotional eating and has been somewhat successful. Her mood is stable and she shows no sign of suicidal or homicidal ideations.  Depression screen [employer] 2/9 03/01/2017 09/14/2016  Decreased Interest 1 0  Down, Depressed, Hopeless 2 0  PHQ - 2 Score 3 0  Altered sleeping 1 -  Tired, decreased energy 3 -  Change in appetite 2 -  Feeling bad or failure about yourself  3 -  Trouble concentrating 3 -  Moving slowly or fidgety/restless 1 -  Suicidal thoughts 1 -  PHQ-9 Score 17 -        ALLERGIES: No Known Allergies  MEDICATIONS: Current  Outpatient Medications on File Prior to Visit  Medication Sig Dispense Refill  . buPROPion (WELLBUTRIN SR) 200 MG 12 hr tablet Take 1 tablet (200 mg total) by mouth every morning. 30 tablet 0  . ergocalciferol (VITAMIN D2) 50000 units capsule Take 1 capsule (50,000 Units total) by mouth once a week. 4 capsule 0  . etonogestrel-ethinyl estradiol (NUVARING) 0.12-0.015 MG/24HR vaginal ring Place 1 each vaginally every 28 (twenty-eight) days. Insert vaginally and leave in place for 3 consecutive weeks, then remove for 1 week.    . fluticasone (FLONASE) 50 MCG/ACT nasal spray Place 2 sprays into both nostrils daily. 16 g 1  . metFORMIN (GLUCOPHAGE) 500 MG tablet Take 1 tablet (500 mg total) by mouth 2 (two) times daily with a meal. 60 tablet 0  . SUMAtriptan (IMITREX) 50 MG tablet One tab at start of migraine. May repeat in 2 hours if headache persists or recurs. 10 tablet 5   No current facility-administered medications on file prior to visit.     PAST MEDICAL HISTORY: Past Medical History:  Diagnosis Date  . ADD (attention deficit disorder)   . Anxiety   . Back pain   . Chest pain   . Constipation   . Depression   . Fatty liver   . Liver function abnormality    HIGH  . Migraines   . Obesity   . Vitamin D deficiency  PAST SURGICAL HISTORY: No past surgical history on file.  SOCIAL HISTORY: Social History   Tobacco Use  . Smoking status: Former Smoker    Years: 4.00  . Smokeless tobacco: Never Used  Substance Use Topics  . Alcohol use: Yes    Comment: occasional  . Drug use: No    FAMILY HISTORY: Family History  Problem Relation Age of Onset  . Diabetes Sister 5       Type 1  . Celiac disease Sister   . Diabetes Mellitus II Maternal Grandmother   . Factor V Leiden deficiency Maternal Grandmother   . Factor V Leiden deficiency Mother   . Obesity Mother   . Hyperlipidemia Father   . Sleep apnea Father   . Obesity Father   . Cancer Paternal Grandmother         breast  . Breast cancer Paternal Grandmother   . Heart Problems Sister        left ventricular non-compaction  . Diabetes Mellitus II Maternal Grandfather   . Cancer Paternal Grandfather        throat/throat and lung, smoker    ROS: Review of Systems  Constitutional: Negative for weight loss.  Gastrointestinal: Negative for nausea and vomiting.  Musculoskeletal:       Negative muscle weakness  Psychiatric/Behavioral: Positive for depression. Negative for suicidal ideas.    PHYSICAL EXAM: Blood pressure 105/73, pulse 98, temperature 98.3 F (36.8 C), temperature source Oral, height 5' 10"  (1.778 m), weight 266 lb (120.7 kg), SpO2 98 %. Body mass index is 38.17 kg/m. Physical Exam  Constitutional: She is oriented to person, place, and time. She appears well-developed and well-nourished.  Cardiovascular: Normal rate.  Pulmonary/Chest: Effort normal.  Musculoskeletal: Normal range of motion.  Neurological: She is oriented to person, place, and time.  Skin: Skin is warm and dry.  Psychiatric: She has a normal mood and affect. Her behavior is normal.  Vitals reviewed.   RECENT LABS AND TESTS: BMET    Component Value Date/Time   NA 141 09/20/2017 0940   K 4.1 09/20/2017 0940   CL 106 09/20/2017 0940   CO2 20 09/20/2017 0940   GLUCOSE 96 09/20/2017 0940   GLUCOSE 101 (H) 08/26/2015 0751   BUN 15 09/20/2017 0940   CREATININE 0.79 09/20/2017 0940   CREATININE 0.79 05/30/2014 0859   CALCIUM 9.4 09/20/2017 0940   GFRNONAA 104 09/20/2017 0940   GFRNONAA >89 05/30/2014 0859   GFRAA 120 09/20/2017 0940   GFRAA >89 05/30/2014 0859   Lab Results  Component Value Date   HGBA1C 5.5 09/20/2017   HGBA1C 5.5 06/15/2017   HGBA1C 5.4 03/01/2017   Lab Results  Component Value Date   INSULIN 30.4 (H) 09/20/2017   INSULIN 29.7 (H) 06/15/2017   INSULIN 32.5 (H) 03/01/2017   CBC    Component Value Date/Time   WBC 10.6 03/01/2017 1041   WBC 9.2 08/26/2015 0751   RBC 4.64  03/01/2017 1041   RBC 4.57 08/26/2015 0751   HGB 13.2 03/01/2017 1041   HCT 38.6 03/01/2017 1041   PLT 276.0 08/26/2015 0751   MCV 83 03/01/2017 1041   MCH 28.4 03/01/2017 1041   MCH 28.4 05/30/2014 0859   MCHC 34.2 03/01/2017 1041   MCHC 33.5 08/26/2015 0751   RDW 13.8 03/01/2017 1041   LYMPHSABS 2.3 03/01/2017 1041   MONOABS 0.7 08/26/2015 0751   EOSABS 0.1 03/01/2017 1041   BASOSABS 0.0 03/01/2017 1041   Iron/TIBC/Ferritin/ %Sat No results found for:  IRON, TIBC, FERRITIN, IRONPCTSAT Lipid Panel     Component Value Date/Time   CHOL 179 09/20/2017 0940   TRIG 153 (H) 09/20/2017 0940   HDL 54 09/20/2017 0940   CHOLHDL 3 09/14/2016 0827   VLDL 34.2 09/14/2016 0827   LDLCALC 94 09/20/2017 0940   Hepatic Function Panel     Component Value Date/Time   PROT 6.9 09/20/2017 0940   ALBUMIN 3.9 09/20/2017 0940   AST 19 09/20/2017 0940   ALT 29 09/20/2017 0940   ALKPHOS 96 09/20/2017 0940   BILITOT 0.3 09/20/2017 0940   BILIDIR 0.1 08/26/2015 0751   IBILI 0.3 05/30/2014 0859      Component Value Date/Time   TSH 3.200 03/01/2017 1041   TSH 3.72 08/26/2015 0751   TSH 2.976 05/30/2014 0859    ASSESSMENT AND PLAN: Vitamin D deficiency - Plan: ergocalciferol (VITAMIN D2) 50000 units capsule  Other depression - with emotional eating - Plan: buPROPion (WELLBUTRIN SR) 200 MG 12 hr tablet  At risk for osteoporosis  Class 2 severe obesity with serious comorbidity and body mass index (BMI) of 38.0 to 38.9 in adult, unspecified obesity type (Kathryn Huff)  PLAN:  Vitamin D Deficiency Kathryn Huff was informed that low vitamin D levels contributes to fatigue and are associated with obesity, breast, and colon cancer. She agrees to continue to take prescription Vit D @50 ,000 IU every week #4 with no refills and will follow up for routine testing of vitamin D, at least 2-3 times per year. She was informed of the risk of over-replacement of vitamin D and agrees to not increase her dose unless he  discusses this with [name] first. Kathryn Huff agrees to follow up with our clinic in 3 weeks.  At risk for osteopenia and osteoporosis Kathryn Huff is at risk for osteopenia and osteoporosis due to her vitamin D deficiency. She was encouraged to take her vitamin D and follow her higher calcium diet and increase strengthening exercise to help strengthen her bones and decrease her risk of osteopenia and osteoporosis.  Depression with Emotional Eating Behaviors We discussed behavior modification techniques today to help Kathryn Huff deal with her emotional eating and depression. She has agreed to take Wellbutrin SR 200 mg qd #30 with no refills and will follow up as directed.  Obesity Kathryn Huff is currently in the action stage of change. As such, her goal is to continue with weight loss efforts She has agreed to follow a lower carbohydrate, vegetable and lean protein rich diet plan Kathryn Huff has been instructed to work up to a goal of 150 minutes of combined cardio and strengthening exercise per week for weight loss and overall health benefits. We discussed the following Behavioral Modification Strategies today: increasing lean protein intake and work on meal planning and easy cooking plans  Kathryn Huff has agreed to follow up with our clinic in 3 weeks. She was informed of the importance of frequent follow up visits to maximize her success with intensive lifestyle modifications for her multiple health conditions.  Kathryn Huff, Kathryn Huff, am acting as transcriptionist for Kathryn Duverney, PA-C  Kathryn Huff have reviewed the above documentation for accuracy and completeness, and Kathryn Huff agree with the above. -Kathryn Duverney, PA-C  Kathryn Huff have reviewed the above note and agree with the plan. -Dennard Nip, MD   OBESITY BEHAVIORAL INTERVENTION VISIT  Today's visit was # 18 out of 22.  Starting weight: 275 lbs Starting date: 03/01/17 Today's weight : 266 lbs  Today's date: 11/20/2017 Total lbs lost to date: 9 (Patients must lose 7  lbs in the first 6 months  to continue with counseling)   ASK: We discussed the diagnosis of obesity with Hulan Fray today and Forrestine agreed to give [name] permission to discuss obesity behavioral modification therapy today.  ASSESS: Mardy has the diagnosis of obesity and her BMI today is 38.17 Rayla is in the action stage of change   ADVISE: Aarilyn was educated on the multiple health risks of obesity as well as the benefit of weight loss to improve her health. She was advised of the need for long term treatment and the importance of lifestyle modifications.  AGREE: Multiple dietary modification options and treatment options were discussed and  Terrica agreed to follow a lower carbohydrate, vegetable and lean protein rich diet plan We discussed the following Behavioral Modification Strategies today: increasing lean protein intake and work on meal planning and easy cooking plans

## 2017-11-23 ENCOUNTER — Ambulatory Visit (INDEPENDENT_AMBULATORY_CARE_PROVIDER_SITE_OTHER): Payer: 59 | Admitting: Physician Assistant

## 2017-11-23 ENCOUNTER — Encounter: Payer: Self-pay | Admitting: Physician Assistant

## 2017-11-23 ENCOUNTER — Other Ambulatory Visit (INDEPENDENT_AMBULATORY_CARE_PROVIDER_SITE_OTHER): Payer: 59

## 2017-11-23 VITALS — BP 108/80 | HR 74 | Ht 69.0 in | Wt 268.2 lb

## 2017-11-23 DIAGNOSIS — R109 Unspecified abdominal pain: Secondary | ICD-10-CM

## 2017-11-23 DIAGNOSIS — Z8379 Family history of other diseases of the digestive system: Secondary | ICD-10-CM

## 2017-11-23 DIAGNOSIS — R197 Diarrhea, unspecified: Secondary | ICD-10-CM | POA: Diagnosis not present

## 2017-11-23 LAB — HIGH SENSITIVITY CRP: CRP, High Sensitivity: 33.81 mg/L — ABNORMAL HIGH (ref 0.000–5.000)

## 2017-11-23 LAB — CBC WITH DIFFERENTIAL/PLATELET
BASOS PCT: 0.3 % (ref 0.0–3.0)
Basophils Absolute: 0 10*3/uL (ref 0.0–0.1)
EOS ABS: 0.1 10*3/uL (ref 0.0–0.7)
Eosinophils Relative: 0.5 % (ref 0.0–5.0)
HCT: 42 % (ref 36.0–46.0)
HEMOGLOBIN: 13.8 g/dL (ref 12.0–15.0)
LYMPHS ABS: 2.4 10*3/uL (ref 0.7–4.0)
Lymphocytes Relative: 21.1 % (ref 12.0–46.0)
MCHC: 32.8 g/dL (ref 30.0–36.0)
MCV: 85.3 fl (ref 78.0–100.0)
MONO ABS: 0.6 10*3/uL (ref 0.1–1.0)
Monocytes Relative: 5.4 % (ref 3.0–12.0)
NEUTROS PCT: 72.7 % (ref 43.0–77.0)
Neutro Abs: 8.1 10*3/uL — ABNORMAL HIGH (ref 1.4–7.7)
Platelets: 330 10*3/uL (ref 150.0–400.0)
RBC: 4.92 Mil/uL (ref 3.87–5.11)
RDW: 13.4 % (ref 11.5–15.5)
WBC: 11.2 10*3/uL — AB (ref 4.0–10.5)

## 2017-11-23 LAB — SEDIMENTATION RATE: SED RATE: 31 mm/h — AB (ref 0–20)

## 2017-11-23 NOTE — Progress Notes (Signed)
Subjective:    Patient ID: Kathryn Huff, female    DOB: 1991/03/22, 26 y.o.   MRN: [id number]  HPI Kathryn Huff is a pleasant 27 year old white female, new to GI today referred by Debbrah Alar NP/primary care for evaluation of diarrhea and abdominal cramping. Patient has history of migraine headaches, depression, ADHD and insulin resistance for which she has been on metformin since about April 2018.  She also started Wellbutrin about that same time. She  says that she had celiac labs done a couple of years ago and that these were indeterminate.  She was referred to GI at that point but never followed through. She does have a sister with celiac disease. She says her current symptoms have been worse over the past 6 months or so.  She said in the past she is always had problems with constipation but over the past 6 months or so she has had problems with diarrhea, usually having 2-3 loose bowel movements per day.  Says this depends on how much she eats.  She does not of any specific food intolerances and does not feel that she is lactose intolerant.  She has had some lower abdominal cramping and urgency for bowel movements at times there is not been any melena or hematochezia.  Appetite has been fine.  She is on a weight management program and trying to lose weight. Review of labs show March 2016 TTG normal at 3 , Gliadin IGG 28, and Gliadin IGA 20  Review of Systems Pertinent positive and negative review of systems were noted in the above HPI section.  All other review of systems was otherwise negative.  Outpatient Encounter Medications as of 11/23/2017  Medication Sig  . buPROPion (WELLBUTRIN SR) 200 MG 12 hr tablet Take 1 tablet (200 mg total) by mouth every morning.  . ergocalciferol (VITAMIN D2) 50000 units capsule Take 1 capsule (50,000 Units total) by mouth once a week.  . etonogestrel-ethinyl estradiol (NUVARING) 0.12-0.015 MG/24HR vaginal ring Place 1 each vaginally every 28 (twenty-eight)  days. Insert vaginally and leave in place for 3 consecutive weeks, then remove for 1 week.  . fluticasone (FLONASE) 50 MCG/ACT nasal spray Place 2 sprays into both nostrils daily.  . metFORMIN (GLUCOPHAGE) 500 MG tablet Take 1 tablet (500 mg total) by mouth 2 (two) times daily with a meal.  . SUMAtriptan (IMITREX) 50 MG tablet One tab at start of migraine. May repeat in 2 hours if headache persists or recurs.   No facility-administered encounter medications on file as of 11/23/2017.    No Known Allergies Patient Active Problem List   Diagnosis Date Noted  . Vitamin D deficiency 08/17/2017  . Insulin resistance 07/27/2017  . Depression 07/27/2017  . Class 2 obesity with serious comorbidity and body mass index (BMI) of 37.0 to 37.9 in adult 03/14/2017  . Hypertriglyceridemia 09/10/2014  . Routine general medical examination at a health care facility 05/30/2014  . ADHD (attention deficit hyperactivity disorder) 05/30/2014  . Fat pad 05/30/2014  . Migraine 05/30/2014   Social History   Socioeconomic History  . Marital status: Married    Spouse name: Erlene Quan  . Number of children: 0  . Years of education: Not on file  . Highest education level: Not on file  Social Needs  . Financial resource strain: Not on file  . Food insecurity - worry: Not on file  . Food insecurity - inability: Not on file  . Transportation needs - medical: Not on file  . Transportation needs -  non-medical: Not on file  Occupational History  . Occupation: Garment/textile technologist & Admission Associate    Employer: [employer]  Tobacco Use  . Smoking status: Former Smoker    Years: 4.00  . Smokeless tobacco: Never Used  Substance and Sexual Activity  . Alcohol use: Yes    Comment: occasional  . Drug use: No  . Sexual activity: Yes    Partners: Male    Birth control/protection: Other-see comments    Comment: nuvaring  Other Topics Concern  . 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      Ms. Porfirio Oar family history includes Breast cancer in her paternal grandmother; Cancer in her paternal grandfather and paternal grandmother; Celiac disease in her sister; Diabetes (age of onset: 41) in her sister; Diabetes Mellitus II in her maternal grandfather and maternal grandmother; Factor V Leiden deficiency in her maternal grandmother and mother; Heart Problems in her sister; Hyperlipidemia in her father; Obesity in her father and mother; Sleep apnea in her father.      Objective:    Vitals:   11/23/17 1003  BP: 108/80  Pulse: 74    Physical Exam; Well-developed young white female in no acute distress, does not blood pressure 108/80, pulse 74, height 5 foot 9, weight 268, BMI 39.6.  HEENT; nontraumatic normocephalic EOMI PERRLA sclera anicteric, Cardiovascular; regular rate and rhythm with S1-S2 no murmur rub or gallop, Pulmonary ;clear bilaterally, Abdomen ;obese, soft no focal tenderness, no guarding or rebound no palpable mass or hepatosplenomegaly, Rectal ;exam not done, Extremities; no clubbing cyanosis or edema skin warm and dry, Neuro psych; mood and affect appropriate       Assessment & Plan:   #49 26 year old white female with 7-monthhistory of loose stools with 2-3 bowel movements per day and associated urgency and mild cramping intermittently. Patient has family history of celiac disease in her sister and had indeterminant TTG/IgA in 2016 Rule out underlying celiac disease, rule out IBS, rule out symptoms secondary to medications i.e. Glucophage and Wellbutrin though much more likely Glucophage.  #2 history of migraines #3.  Depression #4.  ADHD #5.  Obesity   #6 insulin resistance- for which she is on metformin  Plan; Check expanded celiac panel CBC with  differential, CRP and sed rate Have asked patient to stop metformin for 2 weeks and assess for improvement in her GI symptoms.  I have asked her to call back if she has significant improvement. We discussed possible need for EGD with biopsies.  I will hold up off on any further workup until we review her pending labs and see what response she has to discontinuing metformin short-term. Patient will be established with Dr. AHavery Moros Amy SGenia HaroldPA-C 11/23/2017   Cc: ODebbrah Alar NP

## 2017-11-23 NOTE — Patient Instructions (Signed)
Please go to the basement level to have your labs drawn.   Stop Metformin for 2 weeks with a progress report. . Call back in 2 weeks.  Call [phone], choose option 2. Ask for Amy's nurse [place].   You will be established with Dr. [place] Cellar.

## 2017-11-24 NOTE — Progress Notes (Signed)
Agree with assessment and plan as outlined. Agree with full celiac panel, I think will likely warrant an EGD to ensure no celiac pending her course. Amy please let me know results of her blood work, thanks

## 2017-11-30 ENCOUNTER — Telehealth: Payer: Self-pay

## 2017-11-30 LAB — CELIAC PANEL 10
ANTIGLIADIN ABS, IGA: 20 U — AB (ref 0–19)
Endomysial IgA: POSITIVE — AB
GLIADIN IGG: 38 U — AB (ref 0–19)
IgA/Immunoglobulin A, Serum: 199 mg/dL (ref 87–352)
Tissue Transglut Ab: 98 U/mL — ABNORMAL HIGH (ref 0–5)
Transglutaminase IgA: 8 U/mL — ABNORMAL HIGH (ref 0–3)

## 2017-11-30 NOTE — Telephone Encounter (Signed)
Left message for patient to call back for results.  

## 2017-11-30 NOTE — Telephone Encounter (Signed)
Pt said she is returning your call  #[phone]

## 2017-11-30 NOTE — Telephone Encounter (Signed)
See result note.  

## 2017-12-01 ENCOUNTER — Ambulatory Visit (AMBULATORY_SURGERY_CENTER): Payer: Self-pay | Admitting: *Deleted

## 2017-12-01 ENCOUNTER — Other Ambulatory Visit: Payer: Self-pay

## 2017-12-01 VITALS — Ht 69.0 in | Wt 274.0 lb

## 2017-12-01 DIAGNOSIS — R197 Diarrhea, unspecified: Secondary | ICD-10-CM

## 2017-12-01 NOTE — Progress Notes (Signed)
No egg or soy allergy known to patient  No issues with past sedation with any surgeries  or procedures, no intubation problems  No diet pills per patient No home 02 use per patient  No blood thinners per patient  Pt denies issues with constipation  No A fib or A flutter  EMMI video sent to pt's e mail

## 2017-12-06 ENCOUNTER — Ambulatory Visit (AMBULATORY_SURGERY_CENTER): Payer: 59 | Admitting: Gastroenterology

## 2017-12-06 ENCOUNTER — Encounter: Payer: Self-pay | Admitting: Gastroenterology

## 2017-12-06 VITALS — BP 119/70 | HR 58 | Temp 97.3°F | Resp 11 | Ht 69.0 in | Wt 274.0 lb

## 2017-12-06 DIAGNOSIS — R197 Diarrhea, unspecified: Secondary | ICD-10-CM

## 2017-12-06 DIAGNOSIS — R894 Abnormal immunological findings in specimens from other organs, systems and tissues: Secondary | ICD-10-CM | POA: Diagnosis present

## 2017-12-06 DIAGNOSIS — R7989 Other specified abnormal findings of blood chemistry: Secondary | ICD-10-CM | POA: Diagnosis not present

## 2017-12-06 DIAGNOSIS — K2980 Duodenitis without bleeding: Secondary | ICD-10-CM | POA: Diagnosis not present

## 2017-12-06 MED ORDER — SODIUM CHLORIDE 0.9 % IV SOLN: 500.0000 mL | Freq: Once | INTRAVENOUS | Status: DC

## 2017-12-06 NOTE — Progress Notes (Signed)
Called to room to assist during endoscopic procedure.  Patient ID and intended procedure confirmed with present staff. Received instructions for my participation in the procedure from the performing physician.  

## 2017-12-06 NOTE — Progress Notes (Signed)
Report given to PACU, vss 

## 2017-12-06 NOTE — Op Note (Signed)
[name] Procedure Date: 12/06/2017 2:38 PM MRN: [id number] Endoscopist: Remo Lipps P. Carlin Attridge MD, MD Age: 26 Referring MD:  Date of Birth: [birth year] Gender: Female Account #: [id number] Procedure:                Upper GI endoscopy Indications:              Positive celiac serologies, chronic diarrhea, (+)                            family history of celiac disease Medicines:                Monitored Anesthesia Care Procedure:                Pre-Anesthesia Assessment:                           - Prior to the procedure, a History and Physical                            was performed, and patient medications and                            allergies were reviewed. The patient's tolerance of                            previous anesthesia was also reviewed. The risks                            and benefits of the procedure and the sedation                            options and risks were discussed with the patient.                            All questions were answered, and informed consent                            was obtained. Prior Anticoagulants: The patient has                            taken no previous anticoagulant or antiplatelet                            agents. ASA Grade Assessment: I - A normal, healthy                            patient. After reviewing the risks and benefits,                            the patient was deemed in satisfactory condition to                            undergo the procedure.  After obtaining informed consent, the endoscope was                            passed under direct vision. Throughout the                            procedure, the patient's blood pressure, pulse, and                            oxygen saturations were monitored continuously. The                            Endoscope was introduced through the mouth, and                            advanced to the second part  of duodenum. The upper                            GI endoscopy was accomplished without difficulty.                            The patient tolerated the procedure well. Scope In: Scope Out: Findings:                 Esophagogastric landmarks were identified: the                            Z-line was found at 40 cm, the gastroesophageal                            junction was found at 40 cm and the upper extent of                            the gastric folds was found at 40 cm from the                            incisors.                           The exam of the esophagus was otherwise normal.                           The entire examined stomach was normal.                           Patchy mildly erythematous mucosa was found in the                            duodenal bulb. Biopsies were taken with a cold                            forceps for histology.                           The  exam of the duodenum was otherwise mostly                            normal - no obvious mucosal breaks or abnormalities.                           Biopsies for histology were taken with a cold                            forceps in the duodenal bulb and in the second                            portion of the duodenum for evaluation of celiac                            disease. Complications:            No immediate complications. Estimated blood loss:                            Minimal. Estimated Blood Loss:     Estimated blood loss was minimal. Impression:               - Esophagogastric landmarks identified.                           - Normal esophagus                           - Normal stomach.                           - Erythematous duodenopathy. Biopsied.                           - Biopsies were taken with a cold forceps for                            evaluation of celiac disease. Recommendation:           - Patient has a contact number available for                            emergencies. The  signs and symptoms of potential                            delayed complications were discussed with the                            patient. Return to normal activities tomorrow.                            Written discharge instructions were provided to the                            patient.                           -  Can start gluten free diet while biopsies pending                            to see if this helps symptoms                           - Continue present medications.                           - Await pathology results, further recommendations                            pending this result Remo Lipps P. Liliana Dang MD, MD 12/06/2017 2:51:25 PM This report has been signed electronically.

## 2017-12-06 NOTE — Progress Notes (Signed)
Pt's states no medical or surgical changes since previsit or office visit. 

## 2017-12-06 NOTE — Patient Instructions (Signed)
Discharge instructions given. Biopsies taken. Resume previous medications. YOU HAD AN ENDOSCOPIC PROCEDURE TODAY AT [facility]:   Refer to the procedure report that was given to you for any specific questions about what was found during the examination.  If the procedure report does not answer your questions, please call your gastroenterologist to clarify.  If you requested that your care partner not be given the details of your procedure findings, then the procedure report has been included in a sealed envelope for you to review at your convenience later.  YOU SHOULD EXPECT: Some feelings of bloating in the abdomen. Passage of more gas than usual.  Walking can help get rid of the air that was put into your GI tract during the procedure and reduce the bloating. If you had a lower endoscopy (such as a colonoscopy or flexible sigmoidoscopy) you may notice spotting of blood in your stool or on the toilet paper. If you underwent a bowel prep for your procedure, you may not have a normal bowel movement for a few days.  Please Note:  You might notice some irritation and congestion in your nose or some drainage.  This is from the oxygen used during your procedure.  There is no need for concern and it should clear up in a day or so.  SYMPTOMS TO REPORT IMMEDIATELY:   Following upper endoscopy (EGD)  Vomiting of blood or coffee ground material  New chest pain or pain under the shoulder blades  Painful or persistently difficult swallowing  New shortness of breath  Fever of 100F or higher  Black, tarry-looking stools  For urgent or emergent issues, a gastroenterologist can be reached at any hour by calling [phone].   DIET:  We do recommend a small meal at first, but then you may proceed to your regular diet.  Drink plenty of fluids but you should avoid alcoholic beverages for 24 hours.  ACTIVITY:  You should plan to take it easy for the rest of today and you should NOT DRIVE  or use heavy machinery until tomorrow (because of the sedation medicines used during the test).    FOLLOW UP: Our staff will call the number listed on your records the next business day following your procedure to check on you and address any questions or concerns that you may have regarding the information given to you following your procedure. If we do not reach you, we will leave a message.  However, if you are feeling well and you are not experiencing any problems, there is no need to return our call.  We will assume that you have returned to your regular daily activities without incident.  If any biopsies were taken you will be contacted by phone or by letter within the next 1-3 weeks.  Please call us at [phone] if you have not heard about the biopsies in 3 weeks.    SIGNATURES/CONFIDENTIALITY: You and/or your care partner have signed paperwork which will be entered into your electronic medical record.  These signatures attest to the fact that that the information above on your After Visit Summary has been reviewed and is understood.  Full responsibility of the confidentiality of this discharge information lies with you and/or your care-partner.

## 2017-12-07 ENCOUNTER — Telehealth: Payer: Self-pay

## 2017-12-07 NOTE — Telephone Encounter (Signed)
Called [phone] and left a messaged we tried to reach pt for a follow up call. maw

## 2017-12-07 NOTE — Telephone Encounter (Signed)
  Follow up Call-  Call back number 12/06/2017  Post procedure Call Back phone  # [phone]  Permission to leave phone message Yes  Some recent data might be hidden     [facility]

## 2017-12-13 ENCOUNTER — Ambulatory Visit (INDEPENDENT_AMBULATORY_CARE_PROVIDER_SITE_OTHER): Payer: 59 | Admitting: Physician Assistant

## 2017-12-13 VITALS — BP 110/76 | HR 76 | Temp 97.6°F | Ht 70.0 in | Wt 269.0 lb

## 2017-12-13 DIAGNOSIS — R894 Abnormal immunological findings in specimens from other organs, systems and tissues: Secondary | ICD-10-CM | POA: Diagnosis not present

## 2017-12-13 DIAGNOSIS — Z6838 Body mass index (BMI) 38.0-38.9, adult: Secondary | ICD-10-CM | POA: Diagnosis not present

## 2017-12-13 NOTE — Progress Notes (Signed)
Office: [phone]  /  Fax: [phone]   HPI:   Chief Complaint: OBESITY [name] is here to discuss her progress with her obesity treatment plan. She is on the lower carbohydrate, vegetable and lean protein rich diet plan and is following her eating plan approximately 50 % of the time. She states she is exercising 0 minutes 0 times per week. Kathryn Huff has had challenges staying mindful of her eating. She underwent a biopsy for further eval of her chronic diarrhea and abnormal celiac antibody panel, results pending. She has been following a gluten free diet, however, she has not been mindful of her caloric intake or getting her recommended daily protein.   Her weight is 269 lb (122 kg) today and has gained 3 pounds since her last visit. She has lost 6 lbs since starting treatment with [name].  Abnormal Celiac Antibody Panel Pt with a history of chronic diarrhea, panel with evidence of abnormal celiac antibody. Underwent biopsy for eval of celiac disease, results pending. She is advised by Kathryn Huff to get off Metformin and follow gluten free diet, pending biopsy. Patient states diarrhea has improved with change in diet. She denies any N,V, abd pain, blood in stool.  ALLERGIES: No Known Allergies  MEDICATIONS: Current Outpatient Medications on File Prior to Visit  Medication Sig Dispense Refill  . etonogestrel-ethinyl estradiol (NUVARING) 0.12-0.015 MG/24HR vaginal ring Place 1 each vaginally every 28 (twenty-eight) days. Insert vaginally and leave in place for 3 consecutive weeks, then remove for 1 week.    . metFORMIN (GLUCOPHAGE) 500 MG tablet Take 1 tablet (500 mg total) by mouth 2 (two) times daily with a meal. 60 tablet 0   Current Facility-Administered Medications on File Prior to Visit  Medication Dose Route Frequency Provider Last Rate Last Dose  . 0.9 %  sodium chloride infusion  500 mL Intravenous Once Armbruster, Carlota Raspberry, MD        PAST MEDICAL HISTORY: Past Medical History:    Diagnosis Date  . ADD (attention deficit disorder)   . Anxiety   . Back pain   . Chest pain   . Constipation   . Depression   . Fatty liver   . Heart murmur   . Liver function abnormality    HIGH  . Migraines   . Obesity   . Vitamin D deficiency     PAST SURGICAL HISTORY: Past Surgical History:  Procedure Laterality Date  . MOUTH SURGERY     two teeth surgically removed.      SOCIAL HISTORY: Social History   Tobacco Use  . Smoking status: Former Smoker    Years: 4.00    Types: Cigarettes  . Smokeless tobacco: Never Used  Substance Use Topics  . Alcohol use: Yes    Comment: occasional  . Drug use: No    FAMILY HISTORY: Family History  Problem Relation Age of Onset  . Diabetes Sister 5       Type 1  . Celiac disease Sister   . Diabetes Mellitus II Maternal Grandmother   . Factor V Leiden deficiency Maternal Grandmother   . Factor V Leiden deficiency Mother   . Obesity Mother   . Hyperlipidemia Father   . Sleep apnea Father   . Obesity Father   . Cancer Paternal Grandmother        breast  . Breast cancer Paternal Grandmother   . Heart Problems Sister        left ventricular non-compaction  . Diabetes Mellitus II Maternal  Grandfather   . Cancer Paternal Grandfather        throat/throat and lung, smoker  . Esophageal cancer Paternal Grandfather   . Colon cancer Neg Hx   . Colon polyps Neg Hx   . Stomach cancer Neg Hx   . Rectal cancer Neg Hx     ROS: Review of Systems  Constitutional: Negative for weight loss.  Gastrointestinal: Positive for diarrhea. Negative for abdominal pain, blood in stool, nausea and vomiting.    PHYSICAL EXAM: Blood pressure 110/76, pulse 76, temperature 97.6 F (36.4 C), temperature source Oral, height 5' 10"  (1.778 m), weight 269 lb (122 kg), last menstrual period 12/12/2017, SpO2 99 %. Body mass index is 38.6 kg/m. Physical Exam  Constitutional: She is oriented to person, place, and time. She appears well-developed  and well-nourished.  Cardiovascular: Normal rate.  Pulmonary/Chest: Effort normal.  Musculoskeletal: Normal range of motion.  Neurological: She is oriented to person, place, and time.  Skin: Skin is warm and dry.  Psychiatric: She has a normal mood and affect. Her behavior is normal.  Vitals reviewed.   RECENT LABS AND TESTS: BMET    Component Value Date/Time   NA 141 09/20/2017 0940   K 4.1 09/20/2017 0940   CL 106 09/20/2017 0940   CO2 20 09/20/2017 0940   GLUCOSE 96 09/20/2017 0940   GLUCOSE 101 (H) 08/26/2015 0751   BUN 15 09/20/2017 0940   CREATININE 0.79 09/20/2017 0940   CREATININE 0.79 05/30/2014 0859   CALCIUM 9.4 09/20/2017 0940   GFRNONAA 104 09/20/2017 0940   GFRNONAA >89 05/30/2014 0859   GFRAA 120 09/20/2017 0940   GFRAA >89 05/30/2014 0859   Lab Results  Component Value Date   HGBA1C 5.5 09/20/2017   HGBA1C 5.5 06/15/2017   HGBA1C 5.4 03/01/2017   Lab Results  Component Value Date   INSULIN 30.4 (H) 09/20/2017   INSULIN 29.7 (H) 06/15/2017   INSULIN 32.5 (H) 03/01/2017   CBC    Component Value Date/Time   WBC 11.2 (H) 11/23/2017 1048   RBC 4.92 11/23/2017 1048   HGB 13.8 11/23/2017 1048   HGB 13.2 03/01/2017 1041   HCT 42.0 11/23/2017 1048   HCT 38.6 03/01/2017 1041   PLT 330.0 11/23/2017 1048   MCV 85.3 11/23/2017 1048   MCV 83 03/01/2017 1041   MCH 28.4 03/01/2017 1041   MCH 28.4 05/30/2014 0859   MCHC 32.8 11/23/2017 1048   RDW 13.4 11/23/2017 1048   RDW 13.8 03/01/2017 1041   LYMPHSABS 2.4 11/23/2017 1048   LYMPHSABS 2.3 03/01/2017 1041   MONOABS 0.6 11/23/2017 1048   EOSABS 0.1 11/23/2017 1048   EOSABS 0.1 03/01/2017 1041   BASOSABS 0.0 11/23/2017 1048   BASOSABS 0.0 03/01/2017 1041   Iron/TIBC/Ferritin/ %Sat No results found for: IRON, TIBC, FERRITIN, IRONPCTSAT Lipid Panel     Component Value Date/Time   CHOL 179 09/20/2017 0940   TRIG 153 (H) 09/20/2017 0940   HDL 54 09/20/2017 0940   CHOLHDL 3 09/14/2016 0827   VLDL  34.2 09/14/2016 0827   LDLCALC 94 09/20/2017 0940   Hepatic Function Panel     Component Value Date/Time   PROT 6.9 09/20/2017 0940   ALBUMIN 3.9 09/20/2017 0940   AST 19 09/20/2017 0940   ALT 29 09/20/2017 0940   ALKPHOS 96 09/20/2017 0940   BILITOT 0.3 09/20/2017 0940   BILIDIR 0.1 08/26/2015 0751   IBILI 0.3 05/30/2014 0859      Component Value Date/Time   TSH  3.200 03/01/2017 1041   TSH 3.72 08/26/2015 0751   TSH 2.976 05/30/2014 0859    ASSESSMENT AND PLAN: Abnormal celiac antibody panel  Class 2 severe obesity with serious comorbidity and body mass index (BMI) of 38.0 to 38.9 in adult, unspecified obesity type (HCC)  PLAN:  Abnormal Celiac Antibody Panel Continue diet recommendations as per Gastro. Follow up with Gastro as scheduled.   We spent > than 50% of the 15 minute visit on the counseling as documented in the note.  Obesity Kathryn Huff is currently in the action stage of change. As such, her goal is to continue with weight loss efforts She has agreed to follow a lower carbohydrate, vegetable and lean protein rich diet plan Corneshia has been instructed to work up to a goal of 150 minutes of combined cardio and strengthening exercise per week for weight loss and overall health benefits. We discussed the following Behavioral Modification Strategies today: increasing lean protein intake and planning for success   Leza has agreed to follow up with our clinic in 2 weeks. She was informed of the importance of frequent follow up visits to maximize her success with intensive lifestyle modifications for her multiple health conditions.  I, Trixie Dredge, am acting as transcriptionist for Lacy Duverney, PA-C  I have reviewed the above documentation for accuracy and completeness, and I agree with the above. -Lacy Duverney, PA-C  I have reviewed the above note and agree with the plan. -Dennard Nip, MD     Today's visit was # 19 out of 22.  Starting weight: 275  lbs Starting date: 03/01/17 Today's weight : 269 lbs  Today's date: 12/13/2017 Total lbs lost to date: 6 (Patients must lose 7 lbs in the first 6 months to continue with counseling)   ASK: We discussed the diagnosis of obesity with Hulan Fray today and Mahitha agreed to give [name] permission to discuss obesity behavioral modification therapy today.  ASSESS: Shonta has the diagnosis of obesity and her BMI today is 38.17 Sarya is in the action stage of change   ADVISE: Shantaya was educated on the multiple health risks of obesity as well as the benefit of weight loss to improve her health. She was advised of the need for long term treatment and the importance of lifestyle modifications.  AGREE: Multiple dietary modification options and treatment options were discussed and  Khaliya agreed to follow a lower carbohydrate, vegetable and lean protein rich diet plan We discussed the following Behavioral Modification Strategies today: increasing lean protein intake and planning for success

## 2017-12-15 ENCOUNTER — Other Ambulatory Visit: Payer: Self-pay

## 2017-12-15 DIAGNOSIS — K900 Celiac disease: Secondary | ICD-10-CM

## 2017-12-22 ENCOUNTER — Encounter: Payer: Self-pay | Admitting: Family

## 2017-12-22 DIAGNOSIS — K900 Celiac disease: Secondary | ICD-10-CM | POA: Insufficient documentation

## 2017-12-22 HISTORY — DX: Celiac disease: K90.0

## 2017-12-25 ENCOUNTER — Ambulatory Visit (INDEPENDENT_AMBULATORY_CARE_PROVIDER_SITE_OTHER): Payer: 59 | Admitting: Gastroenterology

## 2017-12-25 ENCOUNTER — Ambulatory Visit (INDEPENDENT_AMBULATORY_CARE_PROVIDER_SITE_OTHER)
Admission: RE | Admit: 2017-12-25 | Discharge: 2017-12-25 | Disposition: A | Discharge status: Home / Self Care | Payer: 59 | Source: Ambulatory Visit | Attending: Gastroenterology | Admitting: Gastroenterology

## 2017-12-25 DIAGNOSIS — K900 Celiac disease: Secondary | ICD-10-CM | POA: Diagnosis not present

## 2017-12-25 MED ORDER — PNEUMOCOCCAL 13-VAL CONJ VACC IM SUSP
0.5000 mL | INTRAMUSCULAR | Status: AC
  Administered 2017-12-25: 0.5 mL via INTRAMUSCULAR

## 2017-12-27 ENCOUNTER — Ambulatory Visit (INDEPENDENT_AMBULATORY_CARE_PROVIDER_SITE_OTHER): Payer: 59 | Admitting: Physician Assistant

## 2017-12-27 VITALS — BP 127/84 | HR 85 | Temp 98.2°F | Ht 70.0 in | Wt 266.0 lb

## 2017-12-27 DIAGNOSIS — F3289 Other specified depressive episodes: Secondary | ICD-10-CM | POA: Diagnosis not present

## 2017-12-27 DIAGNOSIS — Z6838 Body mass index (BMI) 38.0-38.9, adult: Secondary | ICD-10-CM

## 2017-12-27 DIAGNOSIS — Z9189 Other specified personal risk factors, not elsewhere classified: Secondary | ICD-10-CM

## 2017-12-27 DIAGNOSIS — E559 Vitamin D deficiency, unspecified: Secondary | ICD-10-CM

## 2017-12-27 MED ORDER — BUPROPION HCL ER (SR) 200 MG PO TB12: 200.0000 mg | ORAL_TABLET | Freq: Every day | ORAL | 0 refills | Status: DC

## 2017-12-27 MED ORDER — ERGOCALCIFEROL 1.25 MG (50000 UT) PO CAPS: 50000.0000 [IU] | ORAL_CAPSULE | ORAL | 0 refills | Status: DC

## 2017-12-27 NOTE — Progress Notes (Addendum)
Office: [phone]  /  Fax: [phone]   HPI:   Chief Complaint: OBESITY Kathryn Huff is here to discuss her progress with her obesity treatment plan. She is on the lower carbohydrate, vegetable and lean protein rich diet plan and is following her eating plan approximately 75 % of the time. She states she is exercising 0 minutes 0 times per week. Lizbeth continues to do well with weight loss/ Her celiac disease has been confirmed with biopsy. She has been incorporating a gluten free diet and has been mindful of her protein and calorie count. Her weight is 266 lb (120.7 kg) today and has had a weight loss of 3 pounds over a period of 2 weeks since her last visit. She has lost 9 lbs since starting treatment with [name].  Vitamin D deficiency Kathryn Huff has a diagnosis of vitamin D deficiency. She is currently taking vit D and denies nausea, vomiting or muscle weakness.   Ref. Range 09/20/2017 09:40  Vitamin D, 25-Hydroxy Latest Ref Range: 30.0 - 100.0 ng/mL 33.7   At risk for osteopenia and osteoporosis Kathryn Huff is at higher risk of osteopenia and osteoporosis due to vitamin D deficiency.   Depression with emotional eating behaviors Kathryn Huff is struggling with emotional eating and using food for comfort to the extent that it is negatively impacting her health. She often snacks when she is not hungry. Kathryn Huff sometimes feels she is out of control and then feels guilty that she made poor food choices. She has been working on behavior modification techniques to help reduce her emotional eating and has been somewhat successful. Her mood is stable and she shows no sign of suicidal or homicidal ideations.  Depression screen [facility] 2/9 03/01/2017 09/14/2016  Decreased Interest 1 0  Down, Depressed, Hopeless 2 0  PHQ - 2 Score 3 0  Altered sleeping 1 -  Tired, decreased energy 3 -  Change in appetite 2 -  Feeling bad or failure about yourself  3 -  Trouble concentrating 3 -  Moving slowly or fidgety/restless 1 -    Suicidal thoughts 1 -  PHQ-9 Score 17 -    ALLERGIES: No Known Allergies  MEDICATIONS: Current Outpatient Medications on File Prior to Visit  Medication Sig Dispense Refill  . etonogestrel-ethinyl estradiol (NUVARING) 0.12-0.015 MG/24HR vaginal ring Place 1 each vaginally every 28 (twenty-eight) days. Insert vaginally and leave in place for 3 consecutive weeks, then remove for 1 week.    . metFORMIN (GLUCOPHAGE) 500 MG tablet Take 1 tablet (500 mg total) by mouth 2 (two) times daily with a meal. 60 tablet 0   Current Facility-Administered Medications on File Prior to Visit  Medication Dose Route Frequency Provider Last Rate Last Dose  . 0.9 %  sodium chloride infusion  500 mL Intravenous Once Armbruster, Carlota Raspberry, MD        PAST MEDICAL HISTORY: Past Medical History:  Diagnosis Date  . ADD (attention deficit disorder)   . Anxiety   . Back pain   . Celiac disease 12/22/2017  . Chest pain   . Constipation   . Depression   . Fatty liver   . Heart murmur   . Liver function abnormality    HIGH  . Migraines   . Obesity   . Vitamin D deficiency     PAST SURGICAL HISTORY: Past Surgical History:  Procedure Laterality Date  . MOUTH SURGERY     two teeth surgically removed.      SOCIAL HISTORY: Social History  Tobacco Use  . Smoking status: Former Smoker    Years: 4.00    Types: Cigarettes  . Smokeless tobacco: Never Used  Substance Use Topics  . Alcohol use: Yes    Comment: occasional  . Drug use: No    FAMILY HISTORY: Family History  Problem Relation Age of Onset  . Diabetes Sister 5       Type 1  . Celiac disease Sister   . Diabetes Mellitus II Maternal Grandmother   . Factor V Leiden deficiency Maternal Grandmother   . Factor V Leiden deficiency Mother   . Obesity Mother   . Hyperlipidemia Father   . Sleep apnea Father   . Obesity Father   . Cancer Paternal Grandmother        breast  . Breast cancer Paternal Grandmother   . Heart Problems Sister         left ventricular non-compaction  . Diabetes Mellitus II Maternal Grandfather   . Cancer Paternal Grandfather        throat/throat and lung, smoker  . Esophageal cancer Paternal Grandfather   . Colon cancer Neg Hx   . Colon polyps Neg Hx   . Stomach cancer Neg Hx   . Rectal cancer Neg Hx     ROS: Review of Systems  Constitutional: Positive for weight loss.  Gastrointestinal: Negative for nausea and vomiting.  Musculoskeletal:       Negative muscle weakness  Psychiatric/Behavioral: Positive for depression. Negative for suicidal ideas.    PHYSICAL EXAM: Blood pressure 127/84, pulse 85, temperature 98.2 F (36.8 C), temperature source Oral, height 5' 10"  (1.778 m), weight 266 lb (120.7 kg), last menstrual period 12/12/2017, SpO2 99 %. Body mass index is 38.17 kg/m. Physical Exam  Constitutional: She is oriented to person, place, and time. She appears well-developed and well-nourished.  Cardiovascular: Normal rate.  Pulmonary/Chest: Effort normal.  Musculoskeletal: Normal range of motion.  Neurological: She is oriented to person, place, and time.  Skin: Skin is warm and dry.  Psychiatric: She has a normal mood and affect. Her behavior is normal.  Vitals reviewed.   RECENT LABS AND TESTS: BMET    Component Value Date/Time   NA 141 09/20/2017 0940   K 4.1 09/20/2017 0940   CL 106 09/20/2017 0940   CO2 20 09/20/2017 0940   GLUCOSE 96 09/20/2017 0940   GLUCOSE 101 (H) 08/26/2015 0751   BUN 15 09/20/2017 0940   CREATININE 0.79 09/20/2017 0940   CREATININE 0.79 05/30/2014 0859   CALCIUM 9.4 09/20/2017 0940   GFRNONAA 104 09/20/2017 0940   GFRNONAA >89 05/30/2014 0859   GFRAA 120 09/20/2017 0940   GFRAA >89 05/30/2014 0859   Lab Results  Component Value Date   HGBA1C 5.5 09/20/2017   HGBA1C 5.5 06/15/2017   HGBA1C 5.4 03/01/2017   Lab Results  Component Value Date   INSULIN 30.4 (H) 09/20/2017   INSULIN 29.7 (H) 06/15/2017   INSULIN 32.5 (H) 03/01/2017    CBC    Component Value Date/Time   WBC 11.2 (H) 11/23/2017 1048   RBC 4.92 11/23/2017 1048   HGB 13.8 11/23/2017 1048   HGB 13.2 03/01/2017 1041   HCT 42.0 11/23/2017 1048   HCT 38.6 03/01/2017 1041   PLT 330.0 11/23/2017 1048   MCV 85.3 11/23/2017 1048   MCV 83 03/01/2017 1041   MCH 28.4 03/01/2017 1041   MCH 28.4 05/30/2014 0859   MCHC 32.8 11/23/2017 1048   RDW 13.4 11/23/2017 1048   RDW  13.8 03/01/2017 1041   LYMPHSABS 2.4 11/23/2017 1048   LYMPHSABS 2.3 03/01/2017 1041   MONOABS 0.6 11/23/2017 1048   EOSABS 0.1 11/23/2017 1048   EOSABS 0.1 03/01/2017 1041   BASOSABS 0.0 11/23/2017 1048   BASOSABS 0.0 03/01/2017 1041   Iron/TIBC/Ferritin/ %Sat No results found for: IRON, TIBC, FERRITIN, IRONPCTSAT Lipid Panel     Component Value Date/Time   CHOL 179 09/20/2017 0940   TRIG 153 (H) 09/20/2017 0940   HDL 54 09/20/2017 0940   CHOLHDL 3 09/14/2016 0827   VLDL 34.2 09/14/2016 0827   LDLCALC 94 09/20/2017 0940   Hepatic Function Panel     Component Value Date/Time   PROT 6.9 09/20/2017 0940   ALBUMIN 3.9 09/20/2017 0940   AST 19 09/20/2017 0940   ALT 29 09/20/2017 0940   ALKPHOS 96 09/20/2017 0940   BILITOT 0.3 09/20/2017 0940   BILIDIR 0.1 08/26/2015 0751   IBILI 0.3 05/30/2014 0859      Component Value Date/Time   TSH 3.200 03/01/2017 1041   TSH 3.72 08/26/2015 0751   TSH 2.976 05/30/2014 0859     Ref. Range 09/20/2017 09:40  Vitamin D, 25-Hydroxy Latest Ref Range: 30.0 - 100.0 ng/mL 33.7    ASSESSMENT AND PLAN: Vitamin D deficiency - Plan: ergocalciferol (VITAMIN D2) 50000 units capsule  Other depression - with emotional eating - Plan: buPROPion (WELLBUTRIN SR) 200 MG 12 hr tablet  At risk for osteoporosis  Class 2 severe obesity with serious comorbidity and body mass index (BMI) of 38.0 to 38.9 in adult, unspecified obesity type ([place])  PLAN:  Vitamin D Deficiency Sritha was informed that low vitamin D levels contributes to fatigue and are  associated with obesity, breast, and colon cancer. She agrees to continue to take prescription Vit D @50 ,000 IU every week #4 with no refills and will follow up for routine testing of vitamin D, at least 2-3 times per year. She was informed of the risk of over-replacement of vitamin D and agrees to not increase her dose unless he discusses this with [name] first. Averie agrees to follow up with our clinic in 2 weeks.  At risk for osteopenia and osteoporosis Ellenor is at risk for osteopenia and osteoporosis due to her vitamin D deficiency. She was encouraged to take her vitamin D and follow her higher calcium diet and increase strengthening exercise to help strengthen her bones and decrease her risk of osteopenia and osteoporosis.  Depression with Emotional Eating Behaviors We discussed behavior modification techniques today to help Zaniah deal with her emotional eating and depression. She has agreed to continue Wellbutrin SR 200 mg qd #30 with no refills and follow up as directed.  Obesity Emilyn is currently in the action stage of change. As such, her goal is to continue with weight loss efforts She has agreed to keep a food journal with 1600 calories and 100 grams of protein daily Sanyah has been instructed to work up to a goal of 150 minutes of combined cardio and strengthening exercise per week for weight loss and overall health benefits. We discussed the following Behavioral Modification Strategies today: increasing lean protein intake and planning for success.  Mellanie has agreed to follow up with our clinic in 2 weeks. She was informed of the importance of frequent follow up visits to maximize her success with intensive lifestyle modifications for her multiple health conditions.  Corey Skains, am acting as transcriptionist for [employer], [facility] have reviewed this note  and agree with its contents   OBESITY BEHAVIORAL INTERVENTION VISIT  Today's visit was # 20 out of  22.  Starting weight: 275 lbs Starting date: 03/01/17 Today's weight : 266 lbs Today's date: 12/27/2017 Total lbs lost to date: 9 (Patients must lose 7 lbs in the first 6 months to continue with counseling)   ASK: We discussed the diagnosis of obesity with Darlin Drop today and Orvella agreed to give [name] permission to discuss obesity behavioral modification therapy today.  ASSESS: Trishia has the diagnosis of obesity and her BMI today is 38.17 Trinitey is in the action stage of change   ADVISE: Lyberti was educated on the multiple health risks of obesity as well as the benefit of weight loss to improve her health. She was advised of the need for long term treatment and the importance of lifestyle modifications.  AGREE: Multiple dietary modification options and treatment options were discussed and  Jaslin agreed to the above obesity treatment plan.   I have reviewed the above documentation for accuracy and completeness, and I agree with the above. -Dennard Nip, MD

## 2018-01-09 ENCOUNTER — Ambulatory Visit (INDEPENDENT_AMBULATORY_CARE_PROVIDER_SITE_OTHER): Payer: 59 | Admitting: Physician Assistant

## 2018-01-09 VITALS — BP 112/74 | HR 76 | Temp 98.0°F | Ht 70.0 in | Wt 266.0 lb

## 2018-01-09 DIAGNOSIS — Z9189 Other specified personal risk factors, not elsewhere classified: Secondary | ICD-10-CM

## 2018-01-09 DIAGNOSIS — E559 Vitamin D deficiency, unspecified: Secondary | ICD-10-CM

## 2018-01-09 DIAGNOSIS — E8881 Metabolic syndrome: Secondary | ICD-10-CM

## 2018-01-09 DIAGNOSIS — Z6838 Body mass index (BMI) 38.0-38.9, adult: Secondary | ICD-10-CM | POA: Diagnosis not present

## 2018-01-09 DIAGNOSIS — F3289 Other specified depressive episodes: Secondary | ICD-10-CM

## 2018-01-09 MED ORDER — ERGOCALCIFEROL 1.25 MG (50000 UT) PO CAPS: 50000.0000 [IU] | ORAL_CAPSULE | ORAL | 0 refills | Status: DC

## 2018-01-09 MED ORDER — BUPROPION HCL ER (SR) 200 MG PO TB12: 200.0000 mg | ORAL_TABLET | Freq: Every day | ORAL | 0 refills | Status: DC

## 2018-01-09 MED ORDER — METFORMIN HCL 500 MG PO TABS: 500.0000 mg | ORAL_TABLET | Freq: Two times a day (BID) | ORAL | 0 refills | Status: DC

## 2018-01-09 MED FILL — BUPROPION HCL SR 200 MG TAB: 200 | 90 days supply | Qty: 90 | Fill #0

## 2018-01-09 MED FILL — metFORMIN HCL 500 MG TABS: 500 | 90 days supply | Qty: 180 | Fill #0

## 2018-01-09 MED FILL — VIT D2 1.25 MG (50,000 UNIT: 1.25 MG | 84 days supply | Qty: 12 | Fill #0

## 2018-01-09 NOTE — Progress Notes (Signed)
Office: [phone]  /  Fax: [phone]   HPI:   Chief Complaint: OBESITY Kathryn Huff is here to discuss her progress with her obesity treatment plan. She is on the keep a food journal with 1600 calories and 100 grams of protein daily and is following her eating plan approximately 75 to 80 % of the time. She states she is exercising 0 minutes 0 times per week. Kathryn Huff maintained her weight. She is switching jobs and will not have insurance coverage for the next 3 months. Kathryn Huff states she will not be able to come for a visit until she resumes her insurance coverage. She is would like more meal planning ideas to help her during her this time.  Her weight is 266 lb (120.7 kg) today and has maintained weight over a period of 2 weeks since her last visit. She has lost 9 lbs since starting treatment with [name].  Insulin Resistance Kathryn Huff has a diagnosis of insulin resistance based on her elevated fasting insulin level >5. Although Kathryn Huff's blood glucose readings are still under good control, insulin resistance puts her at greater risk of metabolic syndrome and diabetes. She is taking metformin currently and denies polyphagia. Kathryn Huff continues to work on diet and exercise to decrease risk of diabetes.  Vitamin D deficiency Kathryn Huff has a diagnosis of vitamin D deficiency. She is currently taking vit D and denies nausea, vomiting or muscle weakness.   Ref. Range 09/20/2017 09:40  Vitamin D, 25-Hydroxy Latest Ref Range: 30.0 - 100.0 ng/mL 33.7   At risk for osteopenia and osteoporosis Kathryn Huff is at higher risk of osteopenia and osteoporosis due to vitamin D deficiency.   Depression with emotional eating behaviors Kathryn Huff is struggling with emotional eating and using food for comfort to the extent that it is negatively impacting her health. She often snacks when she is not hungry. Kathryn Huff sometimes feels she is out of control and then feels guilty that she made poor food choices. She has been working on  behavior modification techniques to help reduce her emotional eating and has been somewhat successful. Her mood is stable and she shows no sign of suicidal or homicidal ideations.  Depression screen [facility] 2/9 03/01/2017 09/14/2016  Decreased Interest 1 0  Down, Depressed, Hopeless 2 0  PHQ - 2 Score 3 0  Altered sleeping 1 -  Tired, decreased energy 3 -  Change in appetite 2 -  Feeling bad or failure about yourself  3 -  Trouble concentrating 3 -  Moving slowly or fidgety/restless 1 -  Suicidal thoughts 1 -  PHQ-9 Score 17 -      ALLERGIES: No Known Allergies  MEDICATIONS: Current Outpatient Medications on File Prior to Visit  Medication Sig Dispense Refill  . buPROPion (WELLBUTRIN SR) 200 MG 12 hr tablet Take 1 tablet (200 mg total) by mouth daily. 30 tablet 0  . ergocalciferol (VITAMIN D2) 50000 units capsule Take 1 capsule (50,000 Units total) by mouth once a week. 4 capsule 0  . etonogestrel-ethinyl estradiol (NUVARING) 0.12-0.015 MG/24HR vaginal ring Place 1 each vaginally every 28 (twenty-eight) days. Insert vaginally and leave in place for 3 consecutive weeks, then remove for 1 week.    . metFORMIN (GLUCOPHAGE) 500 MG tablet Take 1 tablet (500 mg total) by mouth 2 (two) times daily with a meal. 60 tablet 0   Current Facility-Administered Medications on File Prior to Visit  Medication Dose Route Frequency Provider Last Rate Last Dose  . 0.9 %  sodium chloride infusion  500  mL Intravenous Once Armbruster, Carlota Raspberry, MD        PAST MEDICAL HISTORY: Past Medical History:  Diagnosis Date  . ADD (attention deficit disorder)   . Anxiety   . Back pain   . Celiac disease 12/22/2017  . Chest pain   . Constipation   . Depression   . Fatty liver   . Heart murmur   . Liver function abnormality    HIGH  . Migraines   . Obesity   . Vitamin D deficiency     PAST SURGICAL HISTORY: Past Surgical History:  Procedure Laterality Date  . MOUTH SURGERY     two teeth surgically  removed.      SOCIAL HISTORY: Social History   Tobacco Use  . Smoking status: Former Smoker    Years: 4.00    Types: Cigarettes  . Smokeless tobacco: Never Used  Substance Use Topics  . Alcohol use: Yes    Comment: occasional  . Drug use: No    FAMILY HISTORY: Family History  Problem Relation Age of Onset  . Diabetes Sister 5       Type 1  . Celiac disease Sister   . Diabetes Mellitus II Maternal Grandmother   . Factor V Leiden deficiency Maternal Grandmother   . Factor V Leiden deficiency Mother   . Obesity Mother   . Hyperlipidemia Father   . Sleep apnea Father   . Obesity Father   . Cancer Paternal Grandmother        breast  . Breast cancer Paternal Grandmother   . Heart Problems Sister        left ventricular non-compaction  . Diabetes Mellitus II Maternal Grandfather   . Cancer Paternal Grandfather        throat/throat and lung, smoker  . Esophageal cancer Paternal Grandfather   . Colon cancer Neg Hx   . Colon polyps Neg Hx   . Stomach cancer Neg Hx   . Rectal cancer Neg Hx     ROS: Review of Systems  Constitutional: Negative for weight loss.  Gastrointestinal: Negative for nausea and vomiting.  Musculoskeletal:       Negative muscle weakness  Endo/Heme/Allergies:       Negative polyphagia  Psychiatric/Behavioral: Positive for depression. Negative for suicidal ideas.    PHYSICAL EXAM: Blood pressure 112/74, pulse 76, temperature 98 F (36.7 C), temperature source Oral, height 5' 10"  (1.778 m), weight 266 lb (120.7 kg), last menstrual period 12/12/2017, SpO2 99 %. Body mass index is 38.17 kg/m. Physical Exam  Constitutional: She is oriented to person, place, and time. She appears well-developed and well-nourished.  Cardiovascular: Normal rate.  Pulmonary/Chest: Effort normal.  Musculoskeletal: Normal range of motion.  Neurological: She is oriented to person, place, and time.  Skin: Skin is warm and dry.  Psychiatric: She has a normal mood and  affect. Her behavior is normal.  Vitals reviewed.   RECENT LABS AND TESTS: BMET    Component Value Date/Time   NA 141 09/20/2017 0940   K 4.1 09/20/2017 0940   CL 106 09/20/2017 0940   CO2 20 09/20/2017 0940   GLUCOSE 96 09/20/2017 0940   GLUCOSE 101 (H) 08/26/2015 0751   BUN 15 09/20/2017 0940   CREATININE 0.79 09/20/2017 0940   CREATININE 0.79 05/30/2014 0859   CALCIUM 9.4 09/20/2017 0940   GFRNONAA 104 09/20/2017 0940   GFRNONAA >89 05/30/2014 0859   GFRAA 120 09/20/2017 0940   GFRAA >89 05/30/2014 0859   Lab Results  Component Value Date   HGBA1C 5.5 09/20/2017   HGBA1C 5.5 06/15/2017   HGBA1C 5.4 03/01/2017   Lab Results  Component Value Date   INSULIN 30.4 (H) 09/20/2017   INSULIN 29.7 (H) 06/15/2017   INSULIN 32.5 (H) 03/01/2017   CBC    Component Value Date/Time   WBC 11.2 (H) 11/23/2017 1048   RBC 4.92 11/23/2017 1048   HGB 13.8 11/23/2017 1048   HGB 13.2 03/01/2017 1041   HCT 42.0 11/23/2017 1048   HCT 38.6 03/01/2017 1041   PLT 330.0 11/23/2017 1048   MCV 85.3 11/23/2017 1048   MCV 83 03/01/2017 1041   MCH 28.4 03/01/2017 1041   MCH 28.4 05/30/2014 0859   MCHC 32.8 11/23/2017 1048   RDW 13.4 11/23/2017 1048   RDW 13.8 03/01/2017 1041   LYMPHSABS 2.4 11/23/2017 1048   LYMPHSABS 2.3 03/01/2017 1041   MONOABS 0.6 11/23/2017 1048   EOSABS 0.1 11/23/2017 1048   EOSABS 0.1 03/01/2017 1041   BASOSABS 0.0 11/23/2017 1048   BASOSABS 0.0 03/01/2017 1041   Iron/TIBC/Ferritin/ %Sat No results found for: IRON, TIBC, FERRITIN, IRONPCTSAT Lipid Panel     Component Value Date/Time   CHOL 179 09/20/2017 0940   TRIG 153 (H) 09/20/2017 0940   HDL 54 09/20/2017 0940   CHOLHDL 3 09/14/2016 0827   VLDL 34.2 09/14/2016 0827   LDLCALC 94 09/20/2017 0940   Hepatic Function Panel     Component Value Date/Time   PROT 6.9 09/20/2017 0940   ALBUMIN 3.9 09/20/2017 0940   AST 19 09/20/2017 0940   ALT 29 09/20/2017 0940   ALKPHOS 96 09/20/2017 0940   BILITOT  0.3 09/20/2017 0940   BILIDIR 0.1 08/26/2015 0751   IBILI 0.3 05/30/2014 0859      Component Value Date/Time   TSH 3.200 03/01/2017 1041   TSH 3.72 08/26/2015 0751   TSH 2.976 05/30/2014 0859     Ref. Range 09/20/2017 09:40  Vitamin D, 25-Hydroxy Latest Ref Range: 30.0 - 100.0 ng/mL 33.7    ASSESSMENT AND PLAN: Insulin resistance - Plan: metFORMIN (GLUCOPHAGE) 500 MG tablet  Vitamin D deficiency - Plan: ergocalciferol (VITAMIN D2) 50000 units capsule  Other depression - with emotional eating - Plan: buPROPion (WELLBUTRIN SR) 200 MG 12 hr tablet  At risk for osteoporosis  Class 2 severe obesity with serious comorbidity and body mass index (BMI) of 38.0 to 38.9 in adult, unspecified obesity type ([place])  PLAN:  Insulin Resistance Bao will continue to work on weight loss, exercise, and decreasing simple carbohydrates in her diet to help decrease the risk of diabetes. We dicussed metformin including benefits and risks. She was informed that eating too many simple carbohydrates or too many calories at one sitting increases the likelihood of GI side effects. Raychelle requested metformin for now and prescription was written today for metformin 500 mg bid #180 with no refills. Naz agreed to follow up with [name] as directed to monitor her progress.  Vitamin D Deficiency Zula was informed that low vitamin D levels contributes to fatigue and are associated with obesity, breast, and colon cancer. She agrees to continue to take prescription Vit D @50 ,000 IU every week #12 with no refills and will follow up for routine testing of vitamin D, at least 2-3 times per year. She was informed of the risk of over-replacement of vitamin D and agrees to not increase her dose unless she discusses this with [name] first. Manna agrees to follow up with our clinic in 3 months.  At risk for osteopenia and osteoporosis Zoeya is at risk for osteopenia and osteoporosis due to her vitamin D deficiency. She was  encouraged to take her vitamin D and follow her higher calcium diet and increase strengthening exercise to help strengthen her bones and decrease her risk of osteopenia and osteoporosis.  Depression with Emotional Eating Behaviors We discussed behavior modification techniques today to help Matelyn deal with her emotional eating and depression. She has agreed to continue Wellbutrin SR 200 mg qd #90 with no refills and follow up as directed.  Obesity Silvanna is currently in the action stage of change. As such, her goal is to continue with weight loss efforts She has agreed to follow the Category 3 plan Jan has been instructed to work up to a goal of 150 minutes of combined cardio and strengthening exercise per week for weight loss and overall health benefits. We discussed the following Behavioral Modification Strategies today: increasing lean protein intake and work on meal planning and easy cooking plans  Ryenn has agreed to follow up with our clinic in 3 months. She was informed of the importance of frequent follow up visits to maximize her success with intensive lifestyle modifications for her multiple health conditions.    OBESITY BEHAVIORAL INTERVENTION VISIT  Today's visit was # 21 out of 22.  Starting weight: 275 lbs Starting date: 03/01/17 Today's weight : 266 lbs  Today's date: 01/09/2018 Total lbs lost to date: 9 (Patients must lose 7 lbs in the first 6 months to continue with counseling)   ASK: We discussed the diagnosis of obesity with Darlin Drop today and Aubre agreed to give [name] permission to discuss obesity behavioral modification therapy today.  ASSESS: Xoe has the diagnosis of obesity and her BMI today is 38.17 Ziona is in the action stage of change   ADVISE: Celesta was educated on the multiple health risks of obesity as well as the benefit of weight loss to improve her health. She was advised of the need for long term treatment and the importance of  lifestyle modifications.  AGREE: Multiple dietary modification options and treatment options were discussed and  Larrisha agreed to the above obesity treatment plan.   Corey Skains, am acting as transcriptionist for [employer], PA-C I, Lacy Duverney [facility], have reviewed this note and agree with its contents.

## 2018-01-10 ENCOUNTER — Ambulatory Visit (INDEPENDENT_AMBULATORY_CARE_PROVIDER_SITE_OTHER): Payer: 59 | Admitting: Physician Assistant

## 2018-02-07 MED FILL — NUVARING VAGINAL RING: 0.12-0.015 | 28 days supply | Qty: 1 | Fill #4

## 2018-02-13 ENCOUNTER — Telehealth: Payer: 59 | Admitting: Family

## 2018-02-13 DIAGNOSIS — R197 Diarrhea, unspecified: Secondary | ICD-10-CM | POA: Diagnosis not present

## 2018-02-13 DIAGNOSIS — R112 Nausea with vomiting, unspecified: Secondary | ICD-10-CM | POA: Diagnosis not present

## 2018-02-13 MED ORDER — ONDANSETRON HCL 4 MG PO TABS: 4.0000 mg | ORAL_TABLET | Freq: Three times a day (TID) | ORAL | 0 refills | Status: DC | PRN

## 2018-02-13 NOTE — Progress Notes (Signed)

## 2018-02-22 ENCOUNTER — Encounter (HOSPITAL_BASED_OUTPATIENT_CLINIC_OR_DEPARTMENT_OTHER): Payer: Self-pay | Admitting: *Deleted

## 2018-02-22 ENCOUNTER — Emergency Department (HOSPITAL_BASED_OUTPATIENT_CLINIC_OR_DEPARTMENT_OTHER): Payer: 59

## 2018-02-22 ENCOUNTER — Other Ambulatory Visit: Payer: Self-pay

## 2018-02-22 ENCOUNTER — Emergency Department (HOSPITAL_BASED_OUTPATIENT_CLINIC_OR_DEPARTMENT_OTHER)
Admission: EM | Admit: 2018-02-22 | Discharge: 2018-02-22 | Disposition: A | Discharge status: Home / Self Care | Payer: 59 | Attending: Emergency Medicine | Admitting: Emergency Medicine

## 2018-02-22 ENCOUNTER — Ambulatory Visit: Payer: 59 | Admitting: Gastroenterology

## 2018-02-22 DIAGNOSIS — Z87891 Personal history of nicotine dependence: Secondary | ICD-10-CM | POA: Diagnosis not present

## 2018-02-22 DIAGNOSIS — Z79899 Other long term (current) drug therapy: Secondary | ICD-10-CM | POA: Diagnosis not present

## 2018-02-22 DIAGNOSIS — R0789 Other chest pain: Secondary | ICD-10-CM | POA: Diagnosis not present

## 2018-02-22 LAB — CBC WITH DIFFERENTIAL/PLATELET
BASOS ABS: 0 10*3/uL (ref 0.0–0.1)
Basophils Relative: 0 %
Eosinophils Absolute: 0 10*3/uL (ref 0.0–0.7)
Eosinophils Relative: 0 %
HEMATOCRIT: 39.4 % (ref 36.0–46.0)
HEMOGLOBIN: 13.3 g/dL (ref 12.0–15.0)
Lymphocytes Relative: 21 %
Lymphs Abs: 2.2 10*3/uL (ref 0.7–4.0)
MCH: 28.7 pg (ref 26.0–34.0)
MCHC: 33.8 g/dL (ref 30.0–36.0)
MCV: 85.1 fL (ref 78.0–100.0)
Monocytes Absolute: 0.7 10*3/uL (ref 0.1–1.0)
Monocytes Relative: 6 %
NEUTROS ABS: 7.6 10*3/uL (ref 1.7–7.7)
NEUTROS PCT: 73 %
Platelets: 298 10*3/uL (ref 150–400)
RBC: 4.63 MIL/uL (ref 3.87–5.11)
RDW: 13.2 % (ref 11.5–15.5)
WBC: 10.6 10*3/uL — ABNORMAL HIGH (ref 4.0–10.5)

## 2018-02-22 LAB — COMPREHENSIVE METABOLIC PANEL
ALBUMIN: 3.4 g/dL — AB (ref 3.5–5.0)
ALT: 17 U/L (ref 14–54)
ANION GAP: 8 (ref 5–15)
AST: 17 U/L (ref 15–41)
Alkaline Phosphatase: 82 U/L (ref 38–126)
BILIRUBIN TOTAL: 0.2 mg/dL — AB (ref 0.3–1.2)
BUN: 16 mg/dL (ref 6–20)
CO2: 25 mmol/L (ref 22–32)
Calcium: 9.1 mg/dL (ref 8.9–10.3)
Chloride: 103 mmol/L (ref 101–111)
Creatinine, Ser: 0.77 mg/dL (ref 0.44–1.00)
GFR calc Af Amer: >60 mL/min (ref >60)
Glucose, Bld: 100 mg/dL — ABNORMAL HIGH (ref 65–99)
Potassium: 3.6 mmol/L (ref 3.5–5.1)
Sodium: 136 mmol/L (ref 135–145)
TOTAL PROTEIN: 7.2 g/dL (ref 6.5–8.1)

## 2018-02-22 LAB — LIPASE, BLOOD: Lipase: 31 U/L (ref 11–51)

## 2018-02-22 LAB — TROPONIN I

## 2018-02-22 LAB — D-DIMER, QUANTITATIVE: D-Dimer, Quant: 0.35 ug/mL-FEU (ref 0.00–0.50)

## 2018-02-22 NOTE — ED Provider Notes (Signed)
[facility] EMERGENCY DEPARTMENT Provider Note   CSN: [id number] Arrival date & time: 02/22/18  1448     History   Chief Complaint No chief complaint on file.   HPI Kathryn Huff is a 27 y.o. female with history of insulin resistance, hypertriglyceridemia, depression and morbid obesity who presents with chest pain. Patient reports waking up with a sharp left-sided chest pain this morning.  She had no associated symptoms at that time.  Pain lasted 2-3 seconds.  Chest pain improved with Tums.  She was at work about 2 hours ago when she had another episode of chest pain.  This time the chest pain was pressure-like and substernal.  She reports radiation to her back on the right side.  Pain is worse with deep breathing.  She denies associated nausea, vomiting or diaphoresis.  She denies recent illness or fever.  She denies leg swelling or calf tenderness. She is on NuvaRing for birth control.  She denies long car ride or flight. Family history significant for cardiomegaly and blood clot in her mother. Has history of smoking, drinking alcohol or recreational drug use. HPI  Past Medical History:  Diagnosis Date  . ADD (attention deficit disorder)   . Anxiety   . Back pain   . Celiac disease 12/22/2017  . Chest pain   . Constipation   . Depression   . Fatty liver   . Heart murmur   . Liver function abnormality    HIGH  . Migraines   . Obesity   . Vitamin D deficiency     Patient Active Problem List   Diagnosis Date Noted  . Class 2 severe obesity with serious comorbidity and body mass index (BMI) of 38.0 to 38.9 in adult ([place]) 01/09/2018  . Celiac disease 12/22/2017  . Vitamin D deficiency 08/17/2017  . Insulin resistance 07/27/2017  . Depression 07/27/2017  . Class 2 obesity with serious comorbidity and body mass index (BMI) of 37.0 to 37.9 in adult 03/14/2017  . Hypertriglyceridemia 09/10/2014  . Routine general medical examination at a health care facility  05/30/2014  . ADHD (attention deficit hyperactivity disorder) 05/30/2014  . Fat pad 05/30/2014  . Migraine 05/30/2014    Past Surgical History:  Procedure Laterality Date  . MOUTH SURGERY     two teeth surgically removed.      OB History    Gravida Para Term Preterm AB Living   0 0 0 0 0     SAB TAB Ectopic Multiple Live Births                   Home Medications    Prior to Admission medications   Medication Sig Start Date End Date Taking? Authorizing Provider  buPROPion (WELLBUTRIN SR) 200 MG 12 hr tablet Take 1 tablet (200 mg total) by mouth daily. 01/09/18   Waldon Merl, PA-C  ergocalciferol (VITAMIN D2) 50000 units capsule Take 1 capsule (50,000 Units total) by mouth once a week. 01/09/18   Waldon Merl, PA-C  etonogestrel-ethinyl estradiol (NUVARING) 0.12-0.015 MG/24HR vaginal ring Place 1 each vaginally every 28 (twenty-eight) days. Insert vaginally and leave in place for 3 consecutive weeks, then remove for 1 week.    [provider]  metFORMIN (GLUCOPHAGE) 500 MG tablet Take 1 tablet (500 mg total) by mouth 2 (two) times daily with a meal. 01/09/18   Lacy Duverney M, PA-C  ondansetron (ZOFRAN) 4 MG tablet Take 1 tablet (4 mg total) by mouth every  8 (eight) hours as needed for nausea or vomiting. 02/13/18   Kennyth Arnold, FNP    Family History Family History  Problem Relation Age of Onset  . Diabetes Sister 5       Type 1  . Celiac disease Sister   . Diabetes Mellitus II Maternal Grandmother   . Factor V Leiden deficiency Maternal Grandmother   . Factor V Leiden deficiency Mother   . Obesity Mother   . Hyperlipidemia Father   . Sleep apnea Father   . Obesity Father   . Cancer Paternal Grandmother        breast  . Breast cancer Paternal Grandmother   . Heart Problems Sister        left ventricular non-compaction  . Diabetes Mellitus II Maternal Grandfather   . Cancer Paternal Grandfather        throat/throat and lung, smoker  . Esophageal cancer  Paternal Grandfather   . Colon cancer Neg Hx   . Colon polyps Neg Hx   . Stomach cancer Neg Hx   . Rectal cancer Neg Hx     Social History Social History   Tobacco Use  . Smoking status: Former Smoker    Years: 4.00    Types: Cigarettes  . Smokeless tobacco: Never Used  Substance Use Topics  . Alcohol use: Yes    Comment: occasional  . Drug use: No     Allergies   Patient has no known allergies.   Review of Systems Review of Systems  Constitutional: Negative for chills and fever.  HENT: Negative for sore throat.   Eyes: Negative for pain and visual disturbance.  Respiratory: Negative for cough and shortness of breath.   Cardiovascular: Positive for chest pain. Negative for palpitations and leg swelling.  Gastrointestinal: Negative for abdominal pain, nausea and vomiting.  Genitourinary: Negative for dysuria and hematuria.  Musculoskeletal: Negative for myalgias.  Skin: Negative for color change and rash.  Neurological: Negative for seizures and syncope.  All other systems reviewed and are negative.    Physical Exam Updated Vital Signs BP 130/87   Pulse 95   Temp 98.3 F (36.8 C) (Oral)   Resp 20   Ht 5' 9.5" (1.765 m)   Wt 122.5 kg (270 lb)   LMP 02/05/2018   SpO2 100%   BMI 39.30 kg/m   Physical Exam GEN: appears well, no apparent distress. Head: normocephalic and atraumatic  Eyes: conjunctiva without injection, sclera anicteric Oropharynx: mmm without erythema, exudation or petechiae.  HEM: negative for cervical or periauricular lymphadenopathies CVS: RRR, nl s1 & s2, no murmurs, no edema,  2+ radial pulses bilaterally RESP: no IWOB, good air movement bilaterally, CTAB GI: BS present & normal, soft, NTND MSK: Tenderness to palpation over midsternal SKIN: no apparent skin lesion NEURO: alert and oiented appropriately, no gross deficits  PSYCH: euthymic mood with congruent affect  ED Treatments / Results  Labs (all labs ordered are listed, but  only abnormal results are displayed) Labs Reviewed - No data to display  EKG  EKG Interpretation  Date/Time:  Thursday February 22 2018 15:02:12 EST Ventricular Rate:  81 PR Interval:    QRS Duration: 87 QT Interval:  355 QTC Calculation: 412 R Axis:   34 Text Interpretation:  Ectopic atrial rhythm Short PR interval Borderline T wave abnormalities No prior ECG for comparison.  NO STEMI Confirmed by Antony Blackbird [phone]) on 02/22/2018 3:13:10 PM       Radiology No results found.  Procedures  Procedures (including critical care time)  Medications Ordered in ED Medications - No data to display   Initial Impression / Assessment and Plan / ED Course  I have reviewed the triage vital signs and the nursing notes.  Pertinent labs & imaging results that were available during my care of the patient were reviewed by me and considered in my medical decision making (see chart for details).  3:35 PM  Chest pain reproducible suggestive for musculoskeletal etiology.  Vital signs and cardiopulmonary exam within normal limits.  Given history of insulin resistance, morbid obesity and nonspecific T wave changes on EKG, will obtain troponin.  She also reports recent sore throat and cough.  Will obtain CBC with differential and chest x-ray to rule out infectious etiology.  Given history of OCP and family history of blood clot, will obtain d-dimer to rule out PE.   4:43 PM Chest x-ray, troponin, CBC with differential, CMP, lipase and d-dimer except for mildly elevated WBC to 10.6, which is even lower than her previous about a year ago.  Discussed these results with the patient and patient's husband.  At this point, her chest pain is likely musculoskeletal.  Given duration of her chest pain and negative troponin, ACS is very unlikely.  There could be some element of GERD.  Patient felt comfortable going home and following up with a PCP. Final Clinical Impressions(s) / ED Diagnoses   Final diagnoses:    None    ED Discharge Orders    None       Mercy Riding, MD 02/22/18 1652    Tegeler, Gwenyth Allegra, MD 02/23/18 [id number]

## 2018-02-22 NOTE — ED Triage Notes (Signed)
She woke with sharp pain in her left chest. Now she had pain in the mid chest with radiation into her right shoulder.

## 2018-02-22 NOTE — Discharge Instructions (Signed)
It is nice meeting you today! After the tests we did, is very unlikely that your chest pain is from your heart, lung, liver, pancreas or anemia. We think your chest pain is likely from your chest wall muscle.  It could also be due to stomach acid, GERD.  Please follow-up with your primary care doctor.  Return to ED if you have worsening chest pain, trouble breathing, coughing up blood or other symptoms concerning to you.

## 2018-03-02 MED FILL — NUVARING VAGINAL RING: 0.12-0.015 | 28 days supply | Qty: 1 | Fill #0

## 2018-03-26 MED FILL — NUVARING VAGINAL RING: 0.12-0.015 | 27 days supply | Qty: 1 | Fill #0

## 2018-03-28 ENCOUNTER — Encounter: Payer: Self-pay | Admitting: Internal Medicine

## 2018-03-28 ENCOUNTER — Ambulatory Visit (INDEPENDENT_AMBULATORY_CARE_PROVIDER_SITE_OTHER): Admitting: Internal Medicine

## 2018-03-28 VITALS — BP 126/72 | HR 91 | Temp 97.8°F | Resp 14 | Ht 70.0 in | Wt 287.0 lb

## 2018-03-28 DIAGNOSIS — J069 Acute upper respiratory infection, unspecified: Secondary | ICD-10-CM | POA: Diagnosis not present

## 2018-03-28 DIAGNOSIS — J029 Acute pharyngitis, unspecified: Secondary | ICD-10-CM

## 2018-03-28 LAB — POCT RAPID STREP A (OFFICE): RAPID STREP A SCREEN: NEGATIVE

## 2018-03-28 MED ORDER — AMOXICILLIN 875 MG PO TABS: 875.0000 mg | ORAL_TABLET | Freq: Two times a day (BID) | ORAL | 0 refills | Status: DC

## 2018-03-28 MED FILL — AMOXICILLIN 875 MG TABS: 875 | 10 days supply | Qty: 20 | Fill #0

## 2018-03-28 NOTE — Progress Notes (Signed)
Subjective:    Patient ID: Kathryn Huff, female    DOB: 02/19/1991, 27 y.o.   MRN: [id number]  DOS:  03/28/2018 Type of visit - description : acute Interval history: Symptoms started 3 days ago with cough, sore throat which is at times intense, some sputum production. She is concerned because a family member and a coworker were diagnosed with a strep throat. 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  She had a migraine with the onset of her illness but that is largely resolved   Review of Systems  Denies fever chills.  Had some nausea with a migraine Occasional diarrhea due to celiac disease.  No vomiting Denies any unusual aches or pains. Past Medical History:  Diagnosis Date  . ADD (attention deficit disorder)   . Anxiety   . Back pain   . Celiac disease 12/22/2017  . Chest pain   . Constipation   . Depression   . Fatty liver   . Heart murmur   . Liver function abnormality    HIGH  . Migraines   . Obesity   . Vitamin D deficiency     Past Surgical History:  Procedure Laterality Date  . MOUTH SURGERY     two teeth surgically removed.      Social History   Socioeconomic History  . Marital status: Married    Spouse name: Erlene Quan  . Number of children: 0  . Years of education: Not on file  . Highest education level: Not on file  Occupational History  . Occupation: Paediatric nurse: [employer]  . Financial resource strain: Not on file  . Food insecurity:    Worry: Not on file    Inability: Not on file  . Transportation needs:    Medical: Not on file    Non-medical: Not on file  Tobacco Use  . Smoking status: Former Smoker    Years: 4.00    Types: Cigarettes  . Smokeless tobacco: Never Used  Substance and Sexual Activity  . Alcohol use: Yes    Comment: occasional  . Drug use: No  . Sexual activity: Yes    Partners: Male    Birth control/protection: Other-see  comments    Comment: nuvaring  Lifestyle  . Physical activity:    Days per week: Not on file    Minutes per session: Not on file  . Stress: Not on file  Relationships  . Social connections:    Talks on phone: Not on file    Gets together: Not on file    Attends religious service: Not on file    Active member of club or organization: Not on file    Attends meetings of clubs or organizations: Not on file    Relationship status: Not on file  . Intimate partner violence:    Fear of current or ex partner: Not on file    Emotionally abused: Not on file    Physically abused: Not on file    Forced sexual activity: Not on file  Other Topics Concern  . 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  Allergies as of 03/28/2018   No Known Allergies     Medication List        Accurate as of 03/28/18 11:59 PM. Always use your most recent med list.          amoxicillin 875 MG tablet Commonly known as:  AMOXIL Take 1 tablet (875 mg total) by mouth 2 (two) times daily.   buPROPion 200 MG 12 hr tablet Commonly known as:  WELLBUTRIN SR Take 1 tablet (200 mg total) by mouth daily.   ergocalciferol 50000 units capsule Commonly known as:  VITAMIN D2 Take 1 capsule (50,000 Units total) by mouth once a week.   etonogestrel-ethinyl estradiol 0.12-0.015 MG/24HR vaginal ring Commonly known as:  NUVARING Place 1 each vaginally every 28 (twenty-eight) days. Insert vaginally and leave in place for 3 consecutive weeks, then remove for 1 week.   metFORMIN 500 MG tablet Commonly known as:  GLUCOPHAGE Take 1 tablet (500 mg total) by mouth 2 (two) times daily with a meal.          Objective:   Physical Exam BP 126/72 (BP Location: Right Arm, Patient  Position: Sitting, Cuff Size: Normal)   Pulse 91   Temp 97.8 F (36.6 C) (Oral)   Resp 14   Ht 5' 10"  (1.778 m)   Wt 287 lb (130.2 kg)   SpO2 98%   BMI 41.18 kg/m  General:   Well developed, well nourished . NAD.  HEENT:  Normocephalic . Face symmetric, atraumatic.  TMs partially seen due to wax but they are normal.  Nose congested.  Throat: Red but symmetric and no discharge. Lungs:  CTA B Normal respiratory effort, no intercostal retractions, no accessory muscle use. Heart: RRR,  no murmur.  No pretibial edema bilaterally  Skin: Not pale. Not jaundice Neurologic:  alert & oriented X3.  Speech normal, gait appropriate for age and unassisted Psych--  Cognition and judgment appear intact.  Cooperative with normal attention span and concentration.  Behavior appropriate. No anxious or depressed appearing.      Assessment & Plan:    27 year old female, PMH include morbid obesity, depression, insulin resistance, on contraception, presents with  URI: Symptoms consistent with URI, strep a was negative but she was exposed to 2 people with a strep infection.  Will start antibiotics empirically and get a culture.  DC antibiotics if culture negative.  Otherwise supportive treatment, see AVS

## 2018-03-28 NOTE — Patient Instructions (Addendum)
Rest, fluids , tylenol  For cough:  Take Mucinex DM twice a day as needed until better  For nasal congestion: Use OTC  Flonase : 2 nasal sprays on each side of the nose in the morning until you feel better     Take the antibiotic as prescribed  (Amoxicillin).  We are doing a throat culture, if it is negative   please to stop the antibiotics. Results will be in MyChart  Call if not gradually better over the next  10 days  Call anytime if the symptoms are severe

## 2018-03-28 NOTE — Progress Notes (Signed)
Pre visit review using our clinic review tool, if applicable. No additional management support is needed unless otherwise documented below in the visit note. 

## 2018-03-29 ENCOUNTER — Ambulatory Visit: Payer: 59 | Admitting: Gastroenterology

## 2018-03-30 ENCOUNTER — Other Ambulatory Visit: Payer: Self-pay | Admitting: Family Medicine

## 2018-03-30 LAB — CULTURE, GROUP A STREP
MICRO NUMBER:: 90442405
SPECIMEN QUALITY:: ADEQUATE

## 2018-05-01 MED FILL — NUVARING VAGINAL RING: 0.12-0.015 | 84 days supply | Qty: 3 | Fill #1

## 2018-05-23 ENCOUNTER — Ambulatory Visit: Payer: 59 | Admitting: Gastroenterology

## 2018-06-18 ENCOUNTER — Encounter: Payer: 59 | Admitting: Family

## 2018-07-09 ENCOUNTER — Other Ambulatory Visit: Payer: Self-pay

## 2018-07-09 ENCOUNTER — Emergency Department (HOSPITAL_COMMUNITY)
Admission: EM | Admit: 2018-07-09 | Discharge: 2018-07-09 | Disposition: A | Discharge status: Home / Self Care | Attending: Emergency Medicine | Admitting: Emergency Medicine

## 2018-07-09 ENCOUNTER — Encounter (HOSPITAL_COMMUNITY): Payer: Self-pay

## 2018-07-09 ENCOUNTER — Emergency Department (HOSPITAL_COMMUNITY)

## 2018-07-09 DIAGNOSIS — Z7984 Long term (current) use of oral hypoglycemic drugs: Secondary | ICD-10-CM | POA: Insufficient documentation

## 2018-07-09 DIAGNOSIS — S52124A Nondisplaced fracture of head of right radius, initial encounter for closed fracture: Secondary | ICD-10-CM | POA: Diagnosis not present

## 2018-07-09 DIAGNOSIS — Z87891 Personal history of nicotine dependence: Secondary | ICD-10-CM | POA: Diagnosis not present

## 2018-07-09 DIAGNOSIS — F329 Major depressive disorder, single episode, unspecified: Secondary | ICD-10-CM | POA: Insufficient documentation

## 2018-07-09 DIAGNOSIS — W19XXXA Unspecified fall, initial encounter: Secondary | ICD-10-CM

## 2018-07-09 DIAGNOSIS — F419 Anxiety disorder, unspecified: Secondary | ICD-10-CM | POA: Insufficient documentation

## 2018-07-09 DIAGNOSIS — S93402A Sprain of unspecified ligament of left ankle, initial encounter: Secondary | ICD-10-CM | POA: Diagnosis not present

## 2018-07-09 DIAGNOSIS — Y9389 Activity, other specified: Secondary | ICD-10-CM | POA: Diagnosis not present

## 2018-07-09 DIAGNOSIS — Z79899 Other long term (current) drug therapy: Secondary | ICD-10-CM | POA: Insufficient documentation

## 2018-07-09 DIAGNOSIS — Y929 Unspecified place or not applicable: Secondary | ICD-10-CM | POA: Insufficient documentation

## 2018-07-09 DIAGNOSIS — F909 Attention-deficit hyperactivity disorder, unspecified type: Secondary | ICD-10-CM | POA: Insufficient documentation

## 2018-07-09 DIAGNOSIS — Y998 Other external cause status: Secondary | ICD-10-CM | POA: Diagnosis not present

## 2018-07-09 DIAGNOSIS — W010XXA Fall on same level from slipping, tripping and stumbling without subsequent striking against object, initial encounter: Secondary | ICD-10-CM | POA: Diagnosis not present

## 2018-07-09 DIAGNOSIS — S59911A Unspecified injury of right forearm, initial encounter: Secondary | ICD-10-CM | POA: Diagnosis present

## 2018-07-09 MED ORDER — HYDROCODONE-ACETAMINOPHEN 5-325 MG PO TABS: 1.0000 | ORAL_TABLET | ORAL | 0 refills | Status: DC | PRN

## 2018-07-09 MED FILL — HYDROCODON-APAP 5-325: 5-325 | 2 days supply | Qty: 12 | Fill #0

## 2018-07-09 NOTE — ED Provider Notes (Signed)
Patient placed in Quick Look pathway, seen and evaluated   Chief Complaint: fall  Right elbow pain and left ankle pain  HPI:   Pt fell at work   ROS: no numbness in extremities, no impact of head,   Physical Exam:   Gen: No distress  Neuro: Awake and Alert  Skin: Warm    Focused Exam: tender right elbow. Tender left ankle,  Pain with range of motion   Initiation of care has begun. The patient has been counseled on the process, plan, and necessity for staying for the completion/evaluation, and the remainder of the medical screening examination   Sidney Ace 07/09/18 1345    Duffy Bruce, MD 07/09/18 2023

## 2018-07-09 NOTE — ED Notes (Signed)
Pt verbalized understanding of discharge instructions and denies any further questions at this time.   

## 2018-07-09 NOTE — ED Notes (Signed)
Ortho Tech paged

## 2018-07-09 NOTE — ED Notes (Signed)
ED Provider at bedside. 

## 2018-07-09 NOTE — Progress Notes (Signed)
Orthopedic Tech Progress Note Patient Details:  Kathryn Huff 1991/06/23 [id number]  Ortho Devices Type of Ortho Device: Shoulder immobilizer, CAM walker Ortho Device/Splint Interventions: Application   Post Interventions Patient Tolerated: Well Instructions Provided: Care of device   [name] 07/09/2018, 3:28 PM

## 2018-07-09 NOTE — ED Provider Notes (Signed)
[facility]Y DEPARTMENT Provider Note   CSN: [id number] Arrival date & time: 07/09/18  1329     History   Chief Complaint Chief Complaint  Patient presents with  . Arm Injury  . Ankle Injury    HPI Kathryn Huff is a 27 y.o. female.  27 year old presents with injuries from a fall.  Patient was walking back into the building for work when she rolled her left ankle and fell onto her right arm.  Denies loss of consciousness, did not hit her head.  Patient reports previous right radial head fracture when she was younger and fell rollerskating.  No other injuries, complaints or concerns.     Past Medical History:  Diagnosis Date  . ADD (attention deficit disorder)   . Anxiety   . Back pain   . Celiac disease 12/22/2017  . Chest pain   . Constipation   . Depression   . Fatty liver   . Heart murmur   . Liver function abnormality    HIGH  . Migraines   . Obesity   . Vitamin D deficiency     Patient Active Problem List   Diagnosis Date Noted  . Class 2 severe obesity with serious comorbidity and body mass index (BMI) of 38.0 to 38.9 in adult ([place]) 01/09/2018  . Celiac disease 12/22/2017  . Vitamin D deficiency 08/17/2017  . Insulin resistance 07/27/2017  . Depression 07/27/2017  . Class 2 obesity with serious comorbidity and body mass index (BMI) of 37.0 to 37.9 in adult 03/14/2017  . Hypertriglyceridemia 09/10/2014  . Routine general medical examination at a health care facility 05/30/2014  . ADHD (attention deficit hyperactivity disorder) 05/30/2014  . Fat pad 05/30/2014  . Migraine 05/30/2014    Past Surgical History:  Procedure Laterality Date  . MOUTH SURGERY     two teeth surgically removed.       OB History    Gravida  0   Para  0   Term  0   Preterm  0   AB  0   Living        SAB      TAB      Ectopic      Multiple      Live Births               Home Medications    Prior to Admission  medications   Medication Sig Start Date End Date Taking? Authorizing Provider  buPROPion (WELLBUTRIN SR) 200 MG 12 hr tablet Take 1 tablet (200 mg total) by mouth daily. 01/09/18   Waldon Merl, PA-C  ergocalciferol (VITAMIN D2) 50000 units capsule Take 1 capsule (50,000 Units total) by mouth once a week. 01/09/18   Waldon Merl, PA-C  etonogestrel-ethinyl estradiol (NUVARING) 0.12-0.015 MG/24HR vaginal ring Place 1 each vaginally every 28 (twenty-eight) days. Insert vaginally and leave in place for 3 consecutive weeks, then remove for 1 week.    [provider]  HYDROcodone-acetaminophen (NORCO/VICODIN) 5-325 MG tablet Take 1 tablet by mouth every 4 (four) hours as needed. 07/09/18   Tacy Learn, PA-C  metFORMIN (GLUCOPHAGE) 500 MG tablet Take 1 tablet (500 mg total) by mouth 2 (two) times daily with a meal. 01/09/18   Waldon Merl, PA-C    Family History Family History  Problem Relation Age of Onset  . Diabetes Sister 5       Type 1  . Celiac disease Sister   .  Diabetes Mellitus II Maternal Grandmother   . Factor V Leiden deficiency Maternal Grandmother   . Factor V Leiden deficiency Mother   . Obesity Mother   . Hyperlipidemia Father   . Sleep apnea Father   . Obesity Father   . Cancer Paternal Grandmother        breast  . Breast cancer Paternal Grandmother   . Heart Problems Sister        left ventricular non-compaction  . Diabetes Mellitus II Maternal Grandfather   . Cancer Paternal Grandfather        throat/throat and lung, smoker  . Esophageal cancer Paternal Grandfather   . Colon cancer Neg Hx   . Colon polyps Neg Hx   . Stomach cancer Neg Hx   . Rectal cancer Neg Hx     Social History Social History   Tobacco Use  . Smoking status: Former Smoker    Years: 4.00    Types: Cigarettes  . Smokeless tobacco: Never Used  Substance Use Topics  . Alcohol use: Yes    Comment: occasional  . Drug use: No     Allergies   Gluten meal   Review of  Systems Review of Systems  Eyes: Negative for visual disturbance.  Musculoskeletal: Positive for arthralgias, gait problem and joint swelling. Negative for back pain, neck pain and neck stiffness.  Skin: Negative for rash and wound.  Allergic/Immunologic: Negative for immunocompromised state.  Neurological: Negative for dizziness, weakness and headaches.  Hematological: Does not bruise/bleed easily.  Psychiatric/Behavioral: Negative for confusion.  All other systems reviewed and are negative.    Physical Exam Updated Vital Signs BP (!) 148/78 (BP Location: Right Arm)   Pulse 88   Temp 98.1 F (36.7 C)   Resp 16   Ht 5' 9"  (1.753 m)   SpO2 100%   BMI 42.38 kg/m   Physical Exam  Constitutional: She is oriented to person, place, and time. She appears well-developed and well-nourished. No distress.  HENT:  Head: Normocephalic and atraumatic.  Neck: Normal range of motion.  Pulmonary/Chest: Effort normal.  Musculoskeletal: She exhibits tenderness. She exhibits no deformity.       Right elbow: She exhibits decreased range of motion. She exhibits no deformity. Tenderness found. Radial head, lateral epicondyle and olecranon process tenderness noted. No medial epicondyle tenderness noted.       Left ankle: She exhibits decreased range of motion and swelling. She exhibits no ecchymosis, no deformity, no laceration and normal pulse. Tenderness. Lateral malleolus tenderness found. No head of 5th metatarsal and no proximal fibula tenderness found.  Neurological: She is alert and oriented to person, place, and time.  Skin: Skin is warm. Capillary refill takes less than 2 seconds. No rash noted. She is not diaphoretic.  Psychiatric: She has a normal mood and affect. Her behavior is normal.  Nursing note and vitals reviewed.    ED Treatments / Results  Labs (all labs ordered are listed, but only abnormal results are displayed) Labs Reviewed - No data to  display  EKG None  Radiology Dg Elbow Complete Right  Result Date: 07/09/2018 CLINICAL DATA:  [place] at work today.  Severe elbow pain. EXAM: RIGHT ELBOW - COMPLETE 3+ VIEW COMPARISON:  RIGHT elbow radiographs September 04, 2004 FINDINGS: Acute radial head fracture extending to the neck with slight impaction. No dislocation. No destructive bony lesions. Small corticated bony fragment medial elbow. Joint effusion. IMPRESSION: Acute radial head fracture. Small calcification medial elbow most compatible with loose body.  Electronically Signed   By: Elon Alas M.D.   On: 07/09/2018 14:38   Dg Ankle Complete Left  Result Date: 07/09/2018 CLINICAL DATA:  [place] at work today.  Twisted ankle. EXAM: LEFT ANKLE COMPLETE - 3+ VIEW COMPARISON:  None. FINDINGS: No fracture deformity nor dislocation. The ankle mortise appears congruent and the tibiofibular syndesmosis intact. No destructive bony lesions. Mild soft tissue swelling without subcutaneous gas or radiopaque foreign bodies. IMPRESSION: Mild soft tissue swelling.  No acute osseous process. Electronically Signed   By: Elon Alas M.D.   On: 07/09/2018 14:38    Procedures Procedures (including critical care time)  Medications Ordered in ED Medications - No data to display   Initial Impression / Assessment and Plan / ED Course  I have reviewed the triage vital signs and the nursing notes.  Pertinent labs & imaging results that were available during my care of the patient were reviewed by me and considered in my medical decision making (see chart for details).  Clinical Course as of Jul 09 1500  Mon Jul 10, 7879  7063 27 year old female with complaint of right elbow pain and left ankle pain after falling today.  Patient has lateral left ankle tenderness, x-ray shows soft tissue swelling, no fracture.  Tenderness of the right lateral epicondyle and olecranon, limited range of motion of the elbow, no wrist or shoulder pain.  Patient has a  nondisplaced right radial head fracture and will be placed in a sling.  Cam walking boot to left leg for her left ankle sprain.  Refer to orthopedics for follow-up and given prescription for Norco for pain.   [LM]    Clinical Course User Index [LM] Tacy Learn, PA-C    Final Clinical Impressions(s) / ED Diagnoses   Final diagnoses:  Fall, initial encounter  Closed nondisplaced fracture of head of right radius, initial encounter  Sprain of left ankle, unspecified ligament, initial encounter    ED Discharge Orders        Ordered    HYDROcodone-acetaminophen (NORCO/VICODIN) 5-325 MG tablet  Every 4 hours PRN     07/09/18 1454       Tacy Learn, PA-C 07/09/18 1501    Blanchie Dessert, MD 07/09/18 2030

## 2018-07-09 NOTE — Discharge Instructions (Addendum)
Home to rest. Elevate and apply ice for 20 minutes at a time. Norco as needed as prescribed for pain. Follow up with Orthopedics, referral given.

## 2018-07-09 NOTE — ED Triage Notes (Signed)
Pt slipped and fell today twisted left ankle, and fell on right elbow. Pulses palpable, circulation good. Pt unable to extend arm due to pain but denies numbness or tingling.

## 2018-07-09 NOTE — ED Notes (Signed)
Patient verbalizes understanding of discharge instructions. Opportunity for questioning and answers were provided. Armband removed by staff, pt discharged from ED ambulatory.   

## 2018-07-11 ENCOUNTER — Ambulatory Visit: Payer: 59 | Admitting: Gastroenterology

## 2018-07-27 ENCOUNTER — Encounter: Payer: Self-pay | Admitting: Gastroenterology

## 2018-12-20 MED FILL — NITROFURANTOIN MONO-MCR 100: 100 | 7 days supply | Qty: 14 | Fill #0

## 2020-07-31 ENCOUNTER — Emergency Department (HOSPITAL_BASED_OUTPATIENT_CLINIC_OR_DEPARTMENT_OTHER)

## 2020-07-31 ENCOUNTER — Emergency Department (HOSPITAL_BASED_OUTPATIENT_CLINIC_OR_DEPARTMENT_OTHER)
Admission: EM | Admit: 2020-07-31 | Discharge: 2020-07-31 | Disposition: A | Discharge status: Home / Self Care | Attending: Emergency Medicine | Admitting: Emergency Medicine

## 2020-07-31 ENCOUNTER — Other Ambulatory Visit: Payer: Self-pay

## 2020-07-31 ENCOUNTER — Encounter (HOSPITAL_BASED_OUTPATIENT_CLINIC_OR_DEPARTMENT_OTHER): Payer: Self-pay

## 2020-07-31 DIAGNOSIS — Y999 Unspecified external cause status: Secondary | ICD-10-CM | POA: Insufficient documentation

## 2020-07-31 DIAGNOSIS — F909 Attention-deficit hyperactivity disorder, unspecified type: Secondary | ICD-10-CM | POA: Diagnosis not present

## 2020-07-31 DIAGNOSIS — Y939 Activity, unspecified: Secondary | ICD-10-CM | POA: Diagnosis not present

## 2020-07-31 DIAGNOSIS — S61452A Open bite of left hand, initial encounter: Secondary | ICD-10-CM | POA: Insufficient documentation

## 2020-07-31 DIAGNOSIS — W540XXA Bitten by dog, initial encounter: Secondary | ICD-10-CM | POA: Diagnosis not present

## 2020-07-31 DIAGNOSIS — Z87891 Personal history of nicotine dependence: Secondary | ICD-10-CM | POA: Diagnosis not present

## 2020-07-31 DIAGNOSIS — Y929 Unspecified place or not applicable: Secondary | ICD-10-CM | POA: Diagnosis not present

## 2020-07-31 DIAGNOSIS — S60922A Unspecified superficial injury of left hand, initial encounter: Secondary | ICD-10-CM | POA: Diagnosis present

## 2020-07-31 MED ORDER — AMOXICILLIN-POT CLAVULANATE 875-125 MG PO TABS: 1.0000 | ORAL_TABLET | Freq: Two times a day (BID) | ORAL | 0 refills | Status: AC

## 2020-07-31 NOTE — ED Triage Notes (Addendum)
Pt states her dog bit left thumb/hand ~10am-states she "can't bend my thumb"-does not know if dog is UTD on rabies-advised to contact her vet-NAD-steady gait

## 2020-08-01 NOTE — ED Provider Notes (Signed)
[facility]ER[facility] DEPART[facility] Provider Note   CSN: [id number] Arrival date & time: 07/31/20  1334     History Chief Complaint  Patient presents with  . Animal Bite    Kathryn Huff is a 29 y.o. female.  HPI      29yo female presents with concern for dog bite to the left hand.  Has a husky [place] shepard mix (unsure last rabies vax) and one year old tried to use the dogs tail to pull up and she went to pull the dog away as it snapped and it bit her in the left hand on the thumb.  Pain with moving it, some difficulty with flexion. Occurred at 10 AM today. Washed it and used alcohol wipes.  Received TDAp while pregnant.  Just found out she is pregnant again. They are giving up the dog but it will be in custody for observation.  Past Medical History:  Diagnosis Date  . ADD (attention deficit disorder)   . Anxiety   . Back pain   . Celiac disease 12/22/2017  . Chest pain   . Constipation   . Depression   . Fatty liver   . Heart murmur   . Liver function abnormality    HIGH  . Migraines   . Obesity   . Vitamin D deficiency     Patient Active Problem List   Diagnosis Date Noted  . Class 2 severe obesity with serious comorbidity and body mass index (BMI) of 38.0 to 38.9 in adult ([place]) 01/09/2018  . Celiac disease 12/22/2017  . Vitamin D deficiency 08/17/2017  . Insulin resistance 07/27/2017  . Depression 07/27/2017  . Class 2 obesity with serious comorbidity and body mass index (BMI) of 37.0 to 37.9 in adult 03/14/2017  . Hypertriglyceridemia 09/10/2014  . Routine general medical examination at a health care facility 05/30/2014  . ADHD (attention deficit hyperactivity disorder) 05/30/2014  . Fat pad 05/30/2014  . Migraine 05/30/2014    Past Surgical History:  Procedure Laterality Date  . CESAREAN SECTION    . MOUTH SURGERY     two teeth surgically removed.       OB History    Gravida  1   Para  0   Term  0   Preterm  0   AB  0    Living        SAB      TAB      Ectopic      Multiple      Live Births              Family History  Problem Relation Age of Onset  . Diabetes Sister 5       Type 1  . Celiac disease Sister   . Diabetes Mellitus II Maternal Grandmother   . Factor V Leiden deficiency Maternal Grandmother   . Factor V Leiden deficiency Mother   . Obesity Mother   . Hyperlipidemia Father   . Sleep apnea Father   . Obesity Father   . Cancer Paternal Grandmother        breast  . Breast cancer Paternal Grandmother   . Heart Problems Sister        left ventricular non-compaction  . Diabetes Mellitus II Maternal Grandfather   . Cancer Paternal Grandfather        throat/throat and lung, smoker  . Esophageal cancer Paternal Grandfather   . Colon cancer Neg Hx   . Colon polyps Neg  Hx   . Stomach cancer Neg Hx   . Rectal cancer Neg Hx     Social History   Tobacco Use  . Smoking status: Former Smoker    Years: 4.00    Types: Cigarettes  . Smokeless tobacco: Never Used  Vaping Use  . Vaping Use: Never used  Substance Use Topics  . Alcohol use: Yes    Comment: occasional  . Drug use: No    Home Medications Prior to Admission medications   Medication Sig Start Date End Date Taking? Authorizing Provider  acetaminophen (TYLENOL) 500 MG tablet Take 500 mg by mouth every 6 (six) hours as needed for mild pain.   Yes [provider]  Prenatal Vit-Fe Fumarate-FA (MULTIVITAMIN-PRENATAL) 27-0.8 MG TABS tablet Take 1 tablet by mouth daily at 12 noon.   Yes [provider]  amoxicillin-clavulanate (AUGMENTIN) 875-125 MG tablet Take 1 tablet by mouth every 12 (twelve) hours for 5 days. 07/31/20 08/05/20  Gareth Morgan, MD    Allergies    Gluten meal  Review of Systems   Review of Systems  Constitutional: Negative for fever.  Musculoskeletal: Positive for arthralgias.  Skin: Positive for wound.    Physical Exam Updated Vital Signs BP 124/74   Pulse 89   Temp 98.3  F (36.8 C) (Oral)   Resp 16   Ht 5' 10"  (1.778 m)   Wt (!) 141.1 kg   SpO2 99%   BMI 44.62 kg/m   Physical Exam Vitals and nursing note reviewed.  Constitutional:      General: She is not in acute distress.    Appearance: Normal appearance. She is not ill-appearing, toxic-appearing or diaphoretic.  HENT:     Head: Normocephalic.  Eyes:     Conjunctiva/sclera: Conjunctivae normal.  Cardiovascular:     Rate and Rhythm: Normal rate and regular rhythm.     Pulses: Normal pulses.  Pulmonary:     Effort: Pulmonary effort is normal. No respiratory distress.  Musculoskeletal:        General: No deformity or signs of injury.     Cervical back: No rigidity.     Comments: Left thumb with bite near base of nail, superficial abrason to thenar eminence, scratches to proximal thumb. No surrounding erythema Able to flexand extend thumb however with pain. Normal cap refill  Skin:    General: Skin is warm and dry.     Coloration: Skin is not jaundiced or pale.  Neurological:     General: No focal deficit present.     Mental Status: She is alert and oriented to person, place, and time.     ED Results / Procedures / Treatments   Labs (all labs ordered are listed, but only abnormal results are displayed) Labs Reviewed - No data to display  EKG None  Radiology DG Hand Complete Left  Result Date: 07/31/2020 CLINICAL DATA:  Patient bit today by family dog on thumb area and between thumb and forefinger; painful and swollen. EXAM: LEFT HAND - COMPLETE 3+ VIEW COMPARISON:  Left hand radiographs 02/23/2016 FINDINGS: There is no evidence of fracture or dislocation. There is no evidence of arthropathy or other focal bone abnormality. No radiopaque foreign body. IMPRESSION: Negative radiographs of the left hand. Electronically Signed   By: Audie Pinto M.D.   On: 07/31/2020 16:45    Procedures Procedures (including critical care time)  Medications Ordered in ED Medications - No data to  display  ED Course  I have reviewed the triage  vital signs and the nursing notes.  Pertinent labs & imaging results that were available during my care of the patient were reviewed by me and considered in my medical decision making (see chart for details).    MDM Rules/Calculators/A&P                          28yo female presents with concern for dog bite to the left hand.    No sign of fx on XR.  Overall superficial wounds however given location to hand will rx abx prophylaxis.  Is in early pregnancy.  Dog not UTD on rabies vaccine however spends little time outside, has otherwise been healthy, was provoked and will remain under observation under the next week.  Courtne is in early pregnancy and feel risk of rabies exposure in this setting is low and that PEP is not indicated unless the dog develops symptoms over the next week.  Suspect contusion rather than tendon injury, however gave number for hand surgery if she continues to have significant pain or other concerns. Patient discharged in stable condition with understanding of reasons to return.   Final Clinical Impression(s) / ED Diagnoses Final diagnoses:  Dog bite, initial encounter    Rx / DC Orders ED Discharge Orders         Ordered    amoxicillin-clavulanate (AUGMENTIN) 875-125 MG tablet  Every 12 hours     Discontinue  Reprint     07/31/20 1611           Gareth Morgan, MD 08/01/20 1105

## 2021-12-23 ENCOUNTER — Other Ambulatory Visit: Payer: Self-pay

## 2021-12-23 ENCOUNTER — Emergency Department (INDEPENDENT_AMBULATORY_CARE_PROVIDER_SITE_OTHER)

## 2021-12-23 ENCOUNTER — Emergency Department (INDEPENDENT_AMBULATORY_CARE_PROVIDER_SITE_OTHER)
Admission: EM | Admit: 2021-12-23 | Discharge: 2021-12-23 | Disposition: A | Discharge status: Home / Self Care | Source: Home / Self Care

## 2021-12-23 DIAGNOSIS — M79631 Pain in right forearm: Secondary | ICD-10-CM | POA: Diagnosis not present

## 2021-12-23 DIAGNOSIS — M25521 Pain in right elbow: Secondary | ICD-10-CM | POA: Diagnosis not present

## 2021-12-23 DIAGNOSIS — M79601 Pain in right arm: Secondary | ICD-10-CM

## 2021-12-23 DIAGNOSIS — S5001XA Contusion of right elbow, initial encounter: Secondary | ICD-10-CM | POA: Diagnosis not present

## 2021-12-23 MED ORDER — IBUPROFEN 800 MG PO TABS: 800.0000 mg | ORAL_TABLET | Freq: Three times a day (TID) | ORAL | 0 refills | Status: DC

## 2021-12-23 NOTE — Discharge Instructions (Addendum)
Advised patient to RICE right elbow and lower arm for 20 to 25 minutes 3 times daily for the next 2 to 3 days.  Advised may use Ibuprofen 800 mg 1-2 times daily, as needed as well.  Encouraged patient increase daily water intake while taking this medication.

## 2021-12-23 NOTE — ED Provider Notes (Signed)
[name]    CSN: [id number] Arrival date & time: 12/23/21  1232      History   Chief Complaint Chief Complaint  Patient presents with   Arm Injury    Rt arm    HPI Kathryn Huff is a 31 y.o. female.   HPI 31 year old female presents with right lower arm/right elbow injury that occurred earlier today after a fall while attempting to assist her 16-monthold daughter with baby gate at home.  PMH significant for obesity and insulin resistance.  Past Medical History:  Diagnosis Date   ADD (attention deficit disorder)    Anxiety    Back pain    Celiac disease 12/22/2017   Chest pain    Constipation    Depression    Fatty liver    Heart murmur    Liver function abnormality    HIGH   Migraines    Obesity    Vitamin D deficiency     Patient Active Problem List   Diagnosis Date Noted   Class 2 severe obesity with serious comorbidity and body mass index (BMI) of 38.0 to 38.9 in adult ([place] 01/09/2018   Celiac disease 12/22/2017   Vitamin D deficiency 08/17/2017   Insulin resistance 07/27/2017   Depression 07/27/2017   Class 2 obesity with serious comorbidity and body mass index (BMI) of 37.0 to 37.9 in adult 03/14/2017   Hypertriglyceridemia 09/10/2014   Routine general medical examination at a health care facility 05/30/2014   ADHD (attention deficit hyperactivity disorder) 05/30/2014   Fat pad 05/30/2014   Migraine 05/30/2014    Past Surgical History:  Procedure Laterality Date   CESAREAN SECTION     MOUTH SURGERY     two teeth surgically removed.      OB History     Gravida  1   Para  0   Term  0   Preterm  0   AB  0   Living         SAB      IAB      Ectopic      Multiple      Live Births               Home Medications    Prior to Admission medications   Medication Sig Start Date End Date Taking? Authorizing Provider  Etonogestrel-Ethinyl Estradiol ([place] vaginally.   Yes [provider]   ibuprofen (ADVIL) 800 MG tablet Take 1 tablet (800 mg total) by mouth 3 (three) times daily. 12/23/21  Yes REliezer Lofts FNP  acetaminophen (TYLENOL) 500 MG tablet Take 500 mg by mouth every 6 (six) hours as needed for mild pain.    [provider]  Prenatal Vit-Fe Fumarate-FA (MULTIVITAMIN-PRENATAL) 27-0.8 MG TABS tablet Take 1 tablet by mouth daily at 12 noon. Patient not taking: Reported on 12/23/2021    [provider]    Family History Family History  Problem Relation Age of Onset   Diabetes Sister 5       Type 1   Celiac disease Sister    Diabetes Mellitus II Maternal Grandmother    Factor V Leiden deficiency Maternal Grandmother    Factor V Leiden deficiency Mother    Obesity Mother    Hyperlipidemia Father    Sleep apnea Father    Obesity Father    Cancer Paternal Grandmother        breast   Breast cancer Paternal Grandmother    Heart  Problems Sister        left ventricular non-compaction   Diabetes Mellitus II Maternal Grandfather    Cancer Paternal Grandfather        throat/throat and lung, smoker   Esophageal cancer Paternal Grandfather    Colon cancer Neg Hx    Colon polyps Neg Hx    Stomach cancer Neg Hx    Rectal cancer Neg Hx     Social History Social History   Tobacco Use   Smoking status: Former    Years: 4.00    Types: Cigarettes   Smokeless tobacco: Never  Vaping Use   Vaping Use: Never used  Substance Use Topics   Alcohol use: Yes    Comment: occasional   Drug use: No     Allergies   Gluten meal   Review of Systems Review of Systems  Musculoskeletal:        Right lower arm/right elbow pain, bruising, and swelling since earlier today secondary to fall    Physical Exam Triage Vital Signs ED Triage Vitals  Enc Vitals Group     BP 12/23/21 1242 130/84     Pulse Rate 12/23/21 1242 79     Resp 12/23/21 1242 16     Temp 12/23/21 1242 98.5 F (36.9 C)     Temp Source 12/23/21 1242 Oral     SpO2 12/23/21 1242 98 %      Weight 12/23/21 1243 (!) 309 lb (140.2 kg)     Height --      Head Circumference --      Peak Flow --      Pain Score 12/23/21 1243 4     Pain Loc --      Pain Edu? --      Excl. in [place]? --    No data found.  Updated Vital Signs BP 130/84 (BP Location: Left Arm)    Pulse 79    Temp 98.5 F (36.9 C) (Oral)    Resp 16    Wt (!) 309 lb (140.2 kg)    SpO2 98%    Breastfeeding No    BMI 44.34 kg/m   Physical Exam Vitals and nursing note reviewed.  Constitutional:      Appearance: She is normal weight.  HENT:     Head: Normocephalic and atraumatic.     Mouth/Throat:     Mouth: Mucous membranes are moist.     Pharynx: Oropharynx is clear.  Eyes:     Extraocular Movements: Extraocular movements intact.     Conjunctiva/sclera: Conjunctivae normal.     Pupils: Pupils are equal, round, and reactive to light.  Cardiovascular:     Rate and Rhythm: Normal rate and regular rhythm.     Pulses: Normal pulses.     Heart sounds: Normal heart sounds.  Pulmonary:     Effort: Pulmonary effort is normal.     Breath sounds: Normal breath sounds.  Musculoskeletal:     Comments: Right lower arm/right elbow (anterior/lateral aspect): TTP over olecranon, superior lateral radius, with mild to moderate soft tissue swelling noted.  Skin:    Findings: Bruising present.  Neurological:     General: No focal deficit present.     Mental Status: She is alert and oriented to person, place, and time.     UC Treatments / Results  Labs (all labs ordered are listed, but only abnormal results are displayed) Labs Reviewed - No data to display  EKG   Radiology DG ELBOW COMPLETE  RIGHT (3+VIEW)  Result Date: 12/23/2021 CLINICAL DATA:  Injury, fall today pain right upper forearm into the right elbow. EXAM: RIGHT ELBOW - COMPLETE 3+ VIEW COMPARISON:  None. FINDINGS: There is no evidence of fracture, dislocation, or joint effusion. There is no evidence of arthropathy or other focal bone abnormality. Soft  tissues are unremarkable. IMPRESSION: No acute fracture or dislocation. Electronically Signed   By: Keane Police D.O.   On: 12/23/2021 13:44   DG Forearm Right  Result Date: 12/23/2021 CLINICAL DATA:  Pain post fall EXAM: RIGHT FOREARM - 2 VIEW COMPARISON:  None. FINDINGS: There is no evidence of fracture or other focal bone lesions. Soft tissues are unremarkable. Elbow better evaluated on concurrent dedicated radiographs, reported separately. IMPRESSION: Negative. Electronically Signed   By: Lucrezia Europe M.D.   On: 12/23/2021 13:45    Procedures Procedures (including critical care time)  Medications Ordered in UC Medications - No data to display  Initial Impression / Assessment and Plan / UC Course  I have reviewed the triage vital signs and the nursing notes.  Pertinent labs & imaging results that were available during my care of the patient were reviewed by me and considered in my medical decision making (see chart for details).     MDM: 1.  Contusion of right elbow, initial encounter-Advised patient to RICE right elbow and lower arm for 20 to 25 minutes 3 times daily for the next 2 to 3 days.  Advised may use Ibuprofen 800 mg 1-2 times daily, as needed as well.  Encouraged patient increase daily water intake while taking this medication.  Patient discharged home, hemodynamically stable. Final Clinical Impressions(s) / UC Diagnoses   Final diagnoses:  Contusion of right elbow, initial encounter  Right arm pain     Discharge Instructions      Advised patient to RICE right elbow and lower arm for 20 to 25 minutes 3 times daily for the next 2 to 3 days.  Advised may use Ibuprofen 800 mg 1-2 times daily, as needed as well.  Encouraged patient increase daily water intake while taking this medication.     ED Prescriptions     Medication Sig Dispense Auth. Provider   ibuprofen (ADVIL) 800 MG tablet Take 1 tablet (800 mg total) by mouth 3 (three) times daily. 30 tablet Eliezer Lofts,  FNP      PDMP not reviewed this encounter.   Eliezer Lofts, [place] 12/23/21 1359

## 2021-12-23 NOTE — ED Triage Notes (Signed)
Pt presents with rt arm bruising and swelling after a fall that occurred today

## 2022-03-17 ENCOUNTER — Telehealth: Payer: Self-pay | Admitting: Family

## 2022-03-17 NOTE — Telephone Encounter (Signed)
Pt was a previous pt and would like to re-establish care with [facility].  ? ?Please advise. ?

## 2022-04-06 ENCOUNTER — Encounter: Payer: Self-pay | Admitting: Family

## 2022-04-06 ENCOUNTER — Ambulatory Visit (INDEPENDENT_AMBULATORY_CARE_PROVIDER_SITE_OTHER): Admitting: Family

## 2022-04-06 VITALS — BP 103/67 | HR 75 | Temp 97.9°F | Resp 16 | Ht 69.5 in | Wt 306.0 lb

## 2022-04-06 DIAGNOSIS — E559 Vitamin D deficiency, unspecified: Secondary | ICD-10-CM

## 2022-04-06 DIAGNOSIS — E781 Pure hyperglyceridemia: Secondary | ICD-10-CM

## 2022-04-06 DIAGNOSIS — Z8249 Family history of ischemic heart disease and other diseases of the circulatory system: Secondary | ICD-10-CM

## 2022-04-06 DIAGNOSIS — F909 Attention-deficit hyperactivity disorder, unspecified type: Secondary | ICD-10-CM | POA: Diagnosis not present

## 2022-04-06 DIAGNOSIS — R739 Hyperglycemia, unspecified: Secondary | ICD-10-CM | POA: Diagnosis not present

## 2022-04-06 DIAGNOSIS — R635 Abnormal weight gain: Secondary | ICD-10-CM

## 2022-04-06 DIAGNOSIS — K900 Celiac disease: Secondary | ICD-10-CM | POA: Diagnosis not present

## 2022-04-06 DIAGNOSIS — F3289 Other specified depressive episodes: Secondary | ICD-10-CM

## 2022-04-06 DIAGNOSIS — G43909 Migraine, unspecified, not intractable, without status migrainosus: Secondary | ICD-10-CM

## 2022-04-06 LAB — COMPREHENSIVE METABOLIC PANEL
ALT: 20 U/L (ref 0–35)
AST: 14 U/L (ref 0–37)
Albumin: 3.6 g/dL (ref 3.5–5.2)
Alkaline Phosphatase: 83 U/L (ref 39–117)
BUN: 11 mg/dL (ref 6–23)
CO2: 26 mEq/L (ref 19–32)
Calcium: 8.5 mg/dL (ref 8.4–10.5)
Chloride: 107 mEq/L (ref 96–112)
Creatinine, Ser: 0.73 mg/dL (ref 0.40–1.20)
GFR: 110.47 mL/min (ref >60.00)
Glucose, Bld: 111 mg/dL — ABNORMAL HIGH (ref 70–99)
Potassium: 4.5 mEq/L (ref 3.5–5.1)
Sodium: 138 mEq/L (ref 135–145)
Total Bilirubin: 0.4 mg/dL (ref 0.2–1.2)
Total Protein: 6.5 g/dL (ref 6.0–8.3)

## 2022-04-06 LAB — HEMOGLOBIN A1C: Hgb A1c MFr Bld: 5.9 % (ref 4.6–6.5)

## 2022-04-06 LAB — LIPID PANEL
Cholesterol: 138 mg/dL (ref 0–200)
HDL: 51.9 mg/dL (ref >39.00)
LDL Cholesterol: 59 mg/dL (ref 0–99)
NonHDL: 86.55
Total CHOL/HDL Ratio: 3
Triglycerides: 136 mg/dL (ref 0.0–149.0)
VLDL: 27.2 mg/dL (ref 0.0–40.0)

## 2022-04-06 LAB — VITAMIN D 25 HYDROXY (VIT D DEFICIENCY, FRACTURES): VITD: 20.55 ng/mL — ABNORMAL LOW (ref 30.00–100.00)

## 2022-04-06 LAB — TSH: TSH: 1.28 u[IU]/mL (ref 0.35–5.50)

## 2022-04-06 NOTE — Assessment & Plan Note (Signed)
Not currently on vit D.   ?

## 2022-04-06 NOTE — Progress Notes (Signed)
? ?Subjective:  ? ?By signing my name below, I, [name], attest that this documentation has been prepared under the direction and in the presence of Debbrah Alar, NP 04/06/2022 ? ? Patient ID: Kathryn Huff, female    DOB: 07/30/1991, 31 y.o.   MRN: [id number] ? ?Chief Complaint  ?Patient presents with  ? New Patient (Initial Visit)  ?  Here to re-establish care  ? ? ?HPI ?Patient is in today for establishment of patient care. She has not been here in 5 years. ? ?Diabetes- She was taking metformin 4 years ago but stopped when she was trying to get pregnant. She is interested in trying ozempic or Mounjaro for help with weight loss. States that she was told she was pre-diabetic when she was pregnant. ? ?ADHD- She reports she has been doing well with her moods. She is not taking any medication right now because the last one made her "zone out " too much. ? ?Weight- She has not been trying to lose weight consciously. She eats a lot of fast food because she works 12-hour shifts. She has been walking a lot at work and not been exercising.  ?Wt Readings from Last 3 Encounters:  ?04/06/22 (!) 306 lb (138.8 kg)  ?12/23/21 (!) 309 lb (140.2 kg)  ?07/31/20 (!) 311 lb (141.1 kg)  ?   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 She did not take any medication but was talking to a counselor. She reports she is feeling so much better after the birth of her second child and going back to work has definitely helped her mood. Does not think she is at risk now.  ? ?She is requesting a 2D echo due to her sister's history of left ventricular contracture.  ? ? ?Past Medical History:  ?Diagnosis Date  ? ADD (attention deficit disorder)   ? Anxiety   ? Back pain   ? Celiac disease 12/22/2017  ? Chest pain   ? Constipation   ? Depression   ? Fatty liver   ? Heart murmur   ? Liver function abnormality   ? HIGH  ? Migraines   ? Obesity   ? Vitamin D deficiency   ? ? ?Past Surgical History:  ?Procedure  Laterality Date  ? CESAREAN SECTION    ? CESAREAN SECTION  03/25/2021  ? MOUTH SURGERY    ? two teeth surgically removed.    ? ? ?Family History  ?Problem Relation Age of Onset  ? Factor V Leiden deficiency Mother   ? Obesity Mother   ? Hyperlipidemia Father   ? Sleep apnea Father   ? Obesity Father   ? Diabetes Sister 5  ?     Type 1  ? Celiac disease Sister   ? Heart Problems Sister   ?     left ventricular non-compaction  ? Diabetes Mellitus II Maternal Grandmother   ? Factor V Leiden deficiency Maternal Grandmother   ? Diabetes Mellitus II Maternal Grandfather   ? Cancer Paternal Grandmother   ?     breast  ? Breast cancer Paternal Grandmother   ? Cancer Paternal Grandfather   ?     throat/throat and lung, smoker  ? Esophageal cancer Paternal Grandfather   ? Colon cancer Neg Hx   ? Colon polyps Neg Hx   ? Stomach cancer Neg Hx   ? Rectal cancer Neg Hx   ? ? ?Social History  ? ?Socioeconomic History  ? Marital status:  Married  ?  Spouse name: Erlene Quan  ? Number of children: 2  ? Years of education: Not on file  ? Highest education level: Not on file  ?Occupational History  ? Occupation: Garment/textile technologist & Admission Associate  ?  Employer: [employer]  ?Tobacco Use  ? Smoking status: Former  ?  Years: 4.00  ?  Types: Cigarettes  ? Smokeless tobacco: Never  ?Vaping Use  ? Vaping Use: Never used  ?Substance and Sexual Activity  ? Alcohol use: Yes  ?  Alcohol/week: 1.0 standard drink  ?  Types: 1 Glasses of wine per week  ?  Comment: occasional  ? Drug use: No  ? Sexual activity: Yes  ?  Partners: Male  ?  Birth control/protection: Other-see comments  ?  Comment: nuvaring  ?Other Topics Concern  ? Not on file  ?Social History Narrative  ? Lives with husband  ? Has one sister and one brother  ? She is a Ship broker at [employer]- will be online at [facility] for mammography  ? Works as Museum/gallery curator in ED  ? Enjoys spending time with her sister  ? One dog- huskey/shepherd mix.    ? 2 children  ? ?Social Determinants of  Health  ? ?Financial Resource Strain: Not on file  ?Food Insecurity: Not on file  ?Transportation Needs: Not on file  ?Physical Activity: Inactive  ? Days of Exercise per Week: 0 days  ? Minutes of Exercise per Session: 0 min  ?Stress: Not on file  ?Social Connections: Not on file  ?Intimate Partner Violence: Not on file  ? ? ?Outpatient Medications Prior to Visit  ?Medication Sig Dispense Refill  ? acetaminophen (TYLENOL) 500 MG tablet Take 500 mg by mouth every 6 (six) hours as needed for mild pain.    ? Etonogestrel-Ethinyl Estradiol ([facility]) Place vaginally.    ? ibuprofen (ADVIL) 800 MG tablet Take 1 tablet (800 mg total) by mouth 3 (three) times daily. 30 tablet 0  ? Prenatal Vit-Fe Fumarate-FA (MULTIVITAMIN-PRENATAL) 27-0.8 MG TABS tablet Take 1 tablet by mouth daily at 12 noon. (Patient not taking: Reported on 12/23/2021)    ? ?Facility-Administered Medications Prior to Visit  ?Medication Dose Route Frequency Provider Last Rate Last Admin  ? 0.9 %  sodium chloride infusion  500 mL Intravenous Once Armbruster, Carlota Raspberry, MD      ? ? ?Allergies  ?Allergen Reactions  ? Gluten Meal Other (See Comments) and Diarrhea  ? ? ?Review of Systems  ?Constitutional:  Negative for fever.  ?HENT:  Negative for ear pain and hearing loss.   ?     (-)nystagmus ?(-)adenopathy  ?Eyes:  Negative for blurred vision.  ?Respiratory:  Negative for cough, shortness of breath and wheezing.   ?Cardiovascular:  Negative for chest pain and leg swelling.  ?Gastrointestinal:  Negative for blood in stool, diarrhea, nausea and vomiting.  ?Genitourinary:  Negative for dysuria and frequency.  ?Musculoskeletal:  Negative for joint pain and myalgias.  ?Skin:  Negative for rash.  ?Neurological:  Negative for headaches.  ?Psychiatric/Behavioral:  Negative for depression. The patient is not nervous/anxious.   ? ?   ?Objective:  ?  ?Physical Exam ?Constitutional:   ?   General: She is not in acute distress. ?   Appearance: Normal appearance. She is  not ill-appearing.  ?HENT:  ?   Head: Normocephalic and atraumatic.  ?   Right Ear: External ear normal.  ?   Left Ear: External ear normal.  ?Eyes:  ?  Extraocular Movements: Extraocular movements intact.  ?   Pupils: Pupils are equal, round, and reactive to light.  ?Cardiovascular:  ?   Rate and Rhythm: Normal rate and regular rhythm.  ?   Pulses: Normal pulses.  ?   Heart sounds: Normal heart sounds. No murmur heard. ?Pulmonary:  ?   Effort: Pulmonary effort is normal. No respiratory distress.  ?   Breath sounds: Normal breath sounds. No wheezing or rhonchi.  ?Abdominal:  ?   General: Bowel sounds are normal. There is no distension.  ?   Palpations: Abdomen is soft.  ?   Tenderness: There is no abdominal tenderness. There is no guarding or rebound.  ?Musculoskeletal:  ?   Cervical back: Neck supple.  ?Lymphadenopathy:  ?   Cervical: No cervical adenopathy.  ?Skin: ?   General: Skin is warm and dry.  ?Neurological:  ?   Mental Status: She is alert and oriented to person, place, and time.  ?Psychiatric:     ?   Behavior: Behavior normal.     ?   Judgment: Judgment normal.  ? ? ?BP 103/67 (BP Location: Right Arm, Patient Position: Sitting, Cuff Size: Large)   Pulse 75   Temp 97.9 ?F (36.6 ?C) (Oral)   Resp 16   Ht 5' 9.5" (1.765 m)   Wt (!) 306 lb (138.8 kg)   SpO2 98%   BMI 44.54 kg/m?  ?Wt Readings from Last 3 Encounters:  ?04/06/22 (!) 306 lb (138.8 kg)  ?12/23/21 (!) 309 lb (140.2 kg)  ?07/31/20 (!) 311 lb (141.1 kg)  ? ? ? ? ?   ?Assessment & Plan:  ? ?Problem List Items Addressed This Visit   ? ?  ? Unprioritized  ? Vitamin D deficiency  ?  Not currently on vit D.   ? ?  ?  ? Relevant Orders  ? Vitamin D (25 hydroxy) (Completed)  ? Morbid obesity ([place])  ?  Wt Readings from Last 3 Encounters:  ?04/06/22 (!) 306 lb (138.8 kg)  ?12/23/21 (!) 309 lb (140.2 kg)  ?07/31/20 (!) 311 lb (141.1 kg)  ?Discussed exercise and meal prepping to help her eat healthier. Will see how her A1C turns out. It looks like  Ozempic/mounjaro are not covered for weight loss only on her insurance plan. If A1C is 6.5 or greater, we trial one of these meds.   ?  ?  ? Migraine  ?  No recent migraines. Monitor.  ? ?  ?  ? Hypertriglyceridemia  ? Re

## 2022-04-06 NOTE — Assessment & Plan Note (Signed)
Doing well without medication. Will continue to monitor. ?

## 2022-04-06 NOTE — Assessment & Plan Note (Signed)
Tries to stay on a strict gluten free diet.  ?

## 2022-04-06 NOTE — Assessment & Plan Note (Signed)
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 Reports that she is doing better overall without medication.  ?

## 2022-04-06 NOTE — Assessment & Plan Note (Addendum)
Wt Readings from Last 3 Encounters:  ?04/06/22 (!) 306 lb (138.8 kg)  ?12/23/21 (!) 309 lb (140.2 kg)  ?07/31/20 (!) 311 lb (141.1 kg)  ? ?Discussed exercise and meal prepping to help her eat healthier. Will see how her A1C turns out. It looks like Ozempic/mounjaro are not covered for weight loss only on her insurance plan. If A1C is 6.5 or greater, we trial one of these meds.   ?

## 2022-04-06 NOTE — Patient Instructions (Signed)
Welcome back! ?Please complete lab work prior to leaving.  ?

## 2022-04-06 NOTE — Assessment & Plan Note (Signed)
No recent migraines. Monitor.  ?

## 2022-04-07 ENCOUNTER — Other Ambulatory Visit: Payer: Self-pay

## 2022-04-07 ENCOUNTER — Telehealth: Payer: Self-pay | Admitting: Family

## 2022-04-07 DIAGNOSIS — E559 Vitamin D deficiency, unspecified: Secondary | ICD-10-CM

## 2022-04-07 MED ORDER — VITAMIN D (ERGOCALCIFEROL) 1.25 MG (50000 UNIT) PO CAPS: 50000.0000 [IU] | ORAL_CAPSULE | ORAL | 0 refills | Status: DC

## 2022-04-07 NOTE — Telephone Encounter (Signed)
Patient advised of results, provider's advise and new rx. She will call to schedule lab appt for Vitamin D check later on depending on her schedule. Orders entered ?

## 2022-04-07 NOTE — Telephone Encounter (Signed)
Sugar is mildly elevated but not in the diabetes range.  Cholesterol looks good.  Vitamin D level is low.  Advise patient to begin vit D 50000 units once weekly for 12 weeks, then repeat vit D level (dx Vit D deficiency).   ? ?

## 2022-04-19 ENCOUNTER — Other Ambulatory Visit (HOSPITAL_BASED_OUTPATIENT_CLINIC_OR_DEPARTMENT_OTHER)

## 2022-04-20 LAB — HM PAP SMEAR: HM Pap smear: NEGATIVE

## 2022-04-26 ENCOUNTER — Ambulatory Visit (HOSPITAL_BASED_OUTPATIENT_CLINIC_OR_DEPARTMENT_OTHER)
Admission: RE | Admit: 2022-04-26 | Discharge: 2022-04-26 | Disposition: A | Discharge status: Home / Self Care | Source: Ambulatory Visit | Attending: Family | Admitting: Family

## 2022-04-26 DIAGNOSIS — I361 Nonrheumatic tricuspid (valve) insufficiency: Secondary | ICD-10-CM | POA: Diagnosis not present

## 2022-04-26 DIAGNOSIS — Z136 Encounter for screening for cardiovascular disorders: Secondary | ICD-10-CM | POA: Insufficient documentation

## 2022-04-26 DIAGNOSIS — Z8249 Family history of ischemic heart disease and other diseases of the circulatory system: Secondary | ICD-10-CM | POA: Diagnosis present

## 2022-04-26 LAB — ECHOCARDIOGRAM COMPLETE
AV Mean grad: 4 mmHg
AV Peak grad: 6.8 mmHg
Ao pk vel: 1.3 m/s
Area-P 1/2: 3.89 cm2
S' Lateral: 3.5 cm

## 2022-04-26 NOTE — Progress Notes (Signed)
?  Echocardiogram ?2D Echocardiogram has been performed. ? ?Kathryn Huff ?04/26/2022, 2:07 PM ?

## 2022-04-27 LAB — HM PAP SMEAR: HM Pap smear: NEGATIVE

## 2022-05-09 ENCOUNTER — Ambulatory Visit (INDEPENDENT_AMBULATORY_CARE_PROVIDER_SITE_OTHER): Admitting: Family

## 2022-05-09 ENCOUNTER — Encounter: Payer: Self-pay | Admitting: Family

## 2022-05-09 DIAGNOSIS — Z0000 Encounter for general adult medical examination without abnormal findings: Secondary | ICD-10-CM

## 2022-05-09 NOTE — Assessment & Plan Note (Addendum)
Wt Readings from Last 3 Encounters:  05/09/22 (!) 306 lb (138.8 kg)  04/06/22 (!) 306 lb (138.8 kg)  12/23/21 (!) 309 lb (140.2 kg)   She is walking more, working on eliminating junk food.  Pap is up to date. Encouraged her to get a bivalent covid booster.

## 2022-05-09 NOTE — Progress Notes (Signed)
Subjective:   By signing my name below, I, [name], attest that this documentation has been prepared under the direction and in the presence of Debbrah Alar, NP 05/09/2022     Patient ID: Kathryn Huff, female    DOB: [birth year], 31 y.o.   MRN: [id number]  Chief Complaint  Patient presents with   Annual Exam         HPI Patient is in today for a comprehensive physical exam.  Pap smear- Last checked on 04/20/2022. Results were normal.  Immunizations- She received the flu shot last year. She has 2 COVID-19 vaccines at this time.  Diet and Exercise- She is trying to exercise more by walking. She stopped drinking soda and is trying to cut out certain foods. She talked to [facility] and was put on the wait list. Dental and vision- She has appointments coming up for dental and vision appointments  Social history- No recent changes. No past surgeries.  She denies having any unexpected weight change, ear pain, hearing loss and rhinorrhea, visual disturbance, cough, chest pain and leg swelling, nausea, vomiting, diarrhea and blood in stool, or dysuria and frequency, for myalgias and arthralgias, rash, headaches, adenopathy, depression or anxiety at this time   Past Medical History:  Diagnosis Date   ADD (attention deficit disorder)    Anxiety    Back pain    Celiac disease 12/22/2017   Chest pain    Constipation    Depression    Fatty liver    Heart murmur    Liver function abnormality    HIGH   Migraines    Obesity    Vitamin D deficiency     Past Surgical History:  Procedure Laterality Date   CESAREAN SECTION     CESAREAN SECTION  03/25/2021   MOUTH SURGERY     two teeth surgically removed.      Family History  Problem Relation Age of Onset   Factor V Leiden deficiency Mother    Obesity Mother    Hyperlipidemia Father    Sleep apnea Father    Obesity Father    Diabetes Sister 5       Type 1   Celiac disease Sister    Heart  Problems Sister        left ventricular non-compaction   Diabetes Mellitus II Maternal Grandmother    Factor V Leiden deficiency Maternal Grandmother    Diabetes Mellitus II Maternal Grandfather    Cancer Paternal Grandmother        breast   Breast cancer Paternal Grandmother    Cancer Paternal Grandfather        throat/throat and lung, smoker   Esophageal cancer Paternal Grandfather    Colon cancer Neg Hx    Colon polyps Neg Hx    Stomach cancer Neg Hx    Rectal cancer Neg Hx     Social History   Socioeconomic History   Marital status: Married    Spouse name: Erlene Quan   Number of children: 2   Years of education: Not on file   Highest education level: Not on file  Occupational History   Occupation: Garment/textile technologist & Admission Associate    Employer:   Tobacco Use   Smoking status: Former    Years: 4.00    Types: Cigarettes   Smokeless tobacco: Never  Vaping Use   Vaping Use: Never used  Substance and Sexual Activity   Alcohol use: Yes  Alcohol/week: 1.0 standard drink    Types: 1 Glasses of wine per week    Comment: occasional   Drug use: No   Sexual activity: Yes    Partners: Male    Birth control/protection: Other-see comments    Comment: nuvaring  Other Topics Concern   Not on file  Social History Narrative   Lives with husband   Has one sister and one brother   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     2 children   Social Determinants of Radio broadcast assistant Strain: Not on file  Food Insecurity: Not on file  Transportation Needs: Not on file  Physical Activity: Inactive   Days of Exercise per Week: 0 days   Minutes of Exercise per Session: 0 min  Stress: Not on file  Social Connections: Not on file  Intimate Partner Violence: Not on file    Outpatient Medications Prior to Visit  Medication Sig Dispense Refill   acetaminophen (TYLENOL) 500 MG tablet Take 500  mg by mouth every 6 (six) hours as needed for mild pain.     Vitamin D, Ergocalciferol, (DRISDOL) 1.25 MG (50000 UNIT) CAPS capsule Take 1 capsule (50,000 Units total) by mouth every 7 (seven) days. 12 capsule 0   Facility-Administered Medications Prior to Visit  Medication Dose Route Frequency Provider Last Rate Last Admin   0.9 %  sodium chloride infusion  500 mL Intravenous Once Armbruster, Carlota Raspberry, MD        Allergies  Allergen Reactions   Gluten Meal Other (See Comments) and Diarrhea    Review of Systems  Constitutional:  Negative for fever.  HENT:  Negative for ear pain and hearing loss.        (-)nystagmus (-)adenopathy  Eyes:  Negative for blurred vision.  Respiratory:  Negative for cough, shortness of breath and wheezing.   Cardiovascular:  Negative for chest pain and leg swelling.  Gastrointestinal:  Negative for blood in stool, diarrhea, nausea and vomiting.  Genitourinary:  Negative for dysuria and frequency.  Musculoskeletal:  Negative for joint pain and myalgias.  Skin:  Negative for rash.  Neurological:  Negative for headaches.  Psychiatric/Behavioral:  Negative for depression. The patient is not nervous/anxious.       Objective:    Physical Exam Constitutional:      General: She is not in acute distress.    Appearance: Normal appearance. She is not ill-appearing.  HENT:     Head: Normocephalic and atraumatic.     Right Ear: External ear normal.     Left Ear: External ear normal.     Mouth/Throat:     Mouth: Mucous membranes are moist.  Eyes:     Extraocular Movements: Extraocular movements intact.     Pupils: Pupils are equal, round, and reactive to light.  Cardiovascular:     Rate and Rhythm: Normal rate and regular rhythm.     Pulses: Normal pulses.     Heart sounds: Normal heart sounds. No murmur heard. Pulmonary:     Effort: Pulmonary effort is normal. No respiratory distress.     Breath sounds: Normal breath sounds. No wheezing or rhonchi.   Abdominal:     General: Bowel sounds are normal. There is no distension.     Palpations: Abdomen is soft.     Tenderness: There is no abdominal tenderness. There is no guarding or rebound.  Musculoskeletal:  Cervical back: Neck supple.  Lymphadenopathy:     Cervical: No cervical adenopathy.  Skin:    General: Skin is warm and dry.  Neurological:     Mental Status: She is alert and oriented to person, place, and time.     Deep Tendon Reflexes:     Reflex Scores:      Patellar reflexes are 2+ on the right side and 2+ on the left side.    Comments: 5/5 strength in upper and lower extremities   Psychiatric:        Behavior: Behavior normal.        Judgment: Judgment normal.    BP 108/71 (BP Location: Right Arm, Patient Position: Sitting, Cuff Size: Large)   Pulse 75   Temp 97.7 F (36.5 C) (Oral)   Resp 16   Ht 5' 9"  (1.753 m)   Wt (!) 306 lb (138.8 kg)   SpO2 100%   BMI 45.19 kg/m  Wt Readings from Last 3 Encounters:  05/09/22 (!) 306 lb (138.8 kg)  04/06/22 (!) 306 lb (138.8 kg)  12/23/21 (!) 309 lb (140.2 kg)      Assessment & Plan:   Problem List Items Addressed This Visit       Unprioritized   Routine general medical examination at a health care facility    Wt Readings from Last 3 Encounters:  05/09/22 (!) 306 lb (138.8 kg)  04/06/22 (!) 306 lb (138.8 kg)  12/23/21 (!) 309 lb (140.2 kg)  She is walking more, working on eliminating junk food.  Pap is up to date. Encouraged her to get a bivalent covid booster.        No orders of the defined types were placed in this encounter.   I,[name],acting as a Education administrator for [employer], NP.,have documented all relevant documentation on the behalf of Nance Pear, NP,as directed by  Nance Pear, NP while in the presence of Nance Pear, NP.   I, Debbrah Alar, NP , personally preformed the services described in this documentation.  All medical record entries made by the  scribe were at my direction and in my presence.  I have reviewed the chart and discharge instructions (if applicable) and agree that the record reflects my personal performance and is accurate and complete. 05/09/2022

## 2022-05-09 NOTE — Patient Instructions (Signed)
Please continue your work on healthy diet, exercise and weight loss.

## 2022-06-28 DIAGNOSIS — Z0289 Encounter for other administrative examinations: Secondary | ICD-10-CM

## 2022-07-13 ENCOUNTER — Ambulatory Visit (INDEPENDENT_AMBULATORY_CARE_PROVIDER_SITE_OTHER): Payer: 59 | Admitting: Family Medicine

## 2022-07-27 ENCOUNTER — Ambulatory Visit (INDEPENDENT_AMBULATORY_CARE_PROVIDER_SITE_OTHER): Admitting: Family Medicine

## 2022-07-27 ENCOUNTER — Encounter (INDEPENDENT_AMBULATORY_CARE_PROVIDER_SITE_OTHER): Payer: Self-pay | Admitting: Family Medicine

## 2022-07-27 ENCOUNTER — Ambulatory Visit (INDEPENDENT_AMBULATORY_CARE_PROVIDER_SITE_OTHER): Payer: 59 | Admitting: Family Medicine

## 2022-07-27 ENCOUNTER — Encounter (INDEPENDENT_AMBULATORY_CARE_PROVIDER_SITE_OTHER): Payer: Self-pay

## 2022-07-27 VITALS — BP 109/75 | HR 77 | Temp 97.7°F | Ht 70.0 in | Wt 310.0 lb

## 2022-07-27 DIAGNOSIS — R0602 Shortness of breath: Secondary | ICD-10-CM | POA: Diagnosis not present

## 2022-07-27 DIAGNOSIS — K900 Celiac disease: Secondary | ICD-10-CM | POA: Diagnosis not present

## 2022-07-27 DIAGNOSIS — Z6841 Body Mass Index (BMI) 40.0 and over, adult: Secondary | ICD-10-CM

## 2022-07-27 DIAGNOSIS — F908 Attention-deficit hyperactivity disorder, other type: Secondary | ICD-10-CM

## 2022-07-27 DIAGNOSIS — E559 Vitamin D deficiency, unspecified: Secondary | ICD-10-CM | POA: Diagnosis not present

## 2022-07-27 DIAGNOSIS — E669 Obesity, unspecified: Secondary | ICD-10-CM

## 2022-07-27 DIAGNOSIS — E66813 Obesity, class 3: Secondary | ICD-10-CM

## 2022-07-27 DIAGNOSIS — R7303 Prediabetes: Secondary | ICD-10-CM | POA: Diagnosis not present

## 2022-07-27 DIAGNOSIS — F3289 Other specified depressive episodes: Secondary | ICD-10-CM

## 2022-07-27 DIAGNOSIS — R5383 Other fatigue: Secondary | ICD-10-CM

## 2022-07-27 DIAGNOSIS — Z1331 Encounter for screening for depression: Secondary | ICD-10-CM | POA: Insufficient documentation

## 2022-07-27 DIAGNOSIS — O99210 Obesity complicating pregnancy, unspecified trimester: Secondary | ICD-10-CM | POA: Insufficient documentation

## 2022-07-27 DIAGNOSIS — O24410 Gestational diabetes mellitus in pregnancy, diet controlled: Secondary | ICD-10-CM | POA: Insufficient documentation

## 2022-07-28 LAB — CBC WITH DIFFERENTIAL/PLATELET
Basophils Absolute: 0 10*3/uL (ref 0.0–0.2)
Basos: 0 %
EOS (ABSOLUTE): 0.1 10*3/uL (ref 0.0–0.4)
Eos: 1 %
Hematocrit: 40.1 % (ref 34.0–46.6)
Hemoglobin: 13.4 g/dL (ref 11.1–15.9)
Immature Grans (Abs): 0 10*3/uL (ref 0.0–0.1)
Immature Granulocytes: 0 %
Lymphocytes Absolute: 1.4 10*3/uL (ref 0.7–3.1)
Lymphs: 17 %
MCH: 28.9 pg (ref 26.6–33.0)
MCHC: 33.4 g/dL (ref 31.5–35.7)
MCV: 86 fL (ref 79–97)
Monocytes Absolute: 0.5 10*3/uL (ref 0.1–0.9)
Monocytes: 6 %
Neutrophils Absolute: 6.3 10*3/uL (ref 1.4–7.0)
Neutrophils: 76 %
Platelets: 277 10*3/uL (ref 150–450)
RBC: 4.64 x10E6/uL (ref 3.77–5.28)
RDW: 12.9 % (ref 11.7–15.4)
WBC: 8.3 10*3/uL (ref 3.4–10.8)

## 2022-07-28 LAB — VITAMIN D 25 HYDROXY (VIT D DEFICIENCY, FRACTURES): Vit D, 25-Hydroxy: 28.6 ng/mL — ABNORMAL LOW (ref 30.0–100.0)

## 2022-07-28 LAB — INSULIN, RANDOM: INSULIN: 16.2 u[IU]/mL (ref 2.6–24.9)

## 2022-07-28 LAB — HEMOGLOBIN A1C
Est. average glucose Bld gHb Est-mCnc: 114 mg/dL
Hgb A1c MFr Bld: 5.6 % (ref 4.8–5.6)

## 2022-07-28 LAB — FOLATE: Folate: 5.1 ng/mL (ref >3.0)

## 2022-07-28 LAB — VITAMIN B12: Vitamin B-12: 444 pg/mL (ref 232–1245)

## 2022-08-09 NOTE — Progress Notes (Signed)
Chief Complaint:   OBESITY Kathryn Huff (MR# [id number]) is a 31 y.o. female who presents for evaluation and treatment of obesity and related comorbidities. Current BMI is Body mass index is 44.48 kg/m. Kathryn Huff has been struggling with her weight for many years and has been unsuccessful in either losing weight, maintaining weight loss, or reaching her healthy weight goal.  Kathryn Huff started to gain weight in high school with 20 pound weight gain every year.  Lost weight with both pregnancies.  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 Gets 7,000-10,000 steps on workdays.  Had eating disorder and her teen years and now struggles to eat/overeating when not hungry.  Her husband is supportive.  She wants to get to a healthier weight before her next pregnancy.  Kathryn Huff is currently in the action stage of change and ready to dedicate time achieving and maintaining a healthier weight. Kathryn Huff is interested in becoming our patient and working on intensive lifestyle modifications including (but not limited to) diet and exercise for weight loss.  Kathryn Huff's habits were reviewed today and are as follows: Her family eats meals together, she thinks her family will eat healthier with her, her desired weight loss is 110 lbs, she has been heavy most of her life, she started gaining weight in high school, her heaviest weight ever was 319 pounds, she is a picky eater and doesn't like to eat healthier foods, she has significant food cravings issues, she skips meals frequently, she is frequently drinking liquids with calories, she has problems with excessive hunger, she frequently eats larger portions than normal, she has binge eating behaviors, and she struggles with emotional eating.  Depression Screen Kathryn Huff's Food and Mood (modified PHQ-9) score was 16.     07/27/2022    8:58 AM  Depression screen PHQ 2/9  Decreased Interest 1  Down, Depressed, Hopeless 2  PHQ - 2 Score 3  Altered sleeping 2   Tired, decreased energy 3  Change in appetite 3  Feeling bad or failure about yourself  2  Trouble concentrating 2  Moving slowly or fidgety/restless 1  Suicidal thoughts 0  PHQ-9 Score 16  Difficult doing work/chores Not difficult at all   Subjective:   1. Other fatigue Kathryn Huff admits to daytime somnolence and denies waking up still tired. Patient has a history of symptoms of daytime fatigue. Kathryn Huff generally gets 7 or 9 hours of sleep per night, and states that she has generally restful sleep. Snoring is present. Apneic episodes are not present. Epworth Sleepiness Score is 9. TSH, CMP within normal limits on 04/06/2022.  2. SOBOE (shortness of breath on exertion) Kathryn Huff notes increasing shortness of breath with exercising and seems to be worsening over time with weight gain. She notes getting out of breath sooner with activity than she used to. This has not gotten worse recently. Kathryn Huff denies shortness of breath at rest or orthopnea.  3. Attention deficit hyperactivity disorder (ADHD), other type Kathryn Huff is no longer on Focalin.  She feels better with focus now, and she is able to work without problems.  4. Celiac disease Kathryn Huff has to avoid gluten but does continue to consume with cravings.  She has bloating and loose stools from consumption of gluten.  5. Vitamin D deficiency Kathryn Huff is on prescription vitamin D 50,000 units once weekly through her PCP.  She has a fair level of energy.  Last vitamin D level was 20.55 on 04/01/2022.  6. Pre-diabetes Kathryn Huff's last A1c  was 5.9 on 04/06/2022.  She had GI side effects from metformin use.  7. Other depression with emotional eating Kathryn Huff's PHQ-9 score is 16.  She has a history of eating disorder.  She took Wellbutrin SR 150 mg back in 2018.  Assessment/Plan:   1. Other fatigue Kathryn Huff does feel that her weight is causing her energy to be lower than it should be. Fatigue may be related to obesity, depression or many other causes. Labs  will be ordered, and in the meanwhile, Kathryn Huff will focus on self care including making healthy food choices, increasing physical activity and focusing on stress reduction.  - EKG 12-Lead - Vitamin B12 - CBC with Differential/Platelet - Folate - Hemoglobin A1c - Insulin, random - VITAMIN D 25 Hydroxy (Vit-D Deficiency, Fractures)  2. SOBOE (shortness of breath on exertion) Kathryn Huff does feel that she gets out of breath more easily that she used to when she exercises. Kathryn Huff's shortness of breath appears to be obesity related and exercise induced. She has agreed to work on weight loss and gradually increase exercise to treat her exercise induced shortness of breath. Will continue to monitor closely.  3. Attention deficit hyperactivity disorder (ADHD), other type Kathryn Huff will continue to work on Corporate treasurer and stress reduction.  4. Celiac disease Kathryn Huff will continue to work on elimination of gluten.  5. Vitamin D deficiency We will check labs today, and we will follow-up at Kathryn Huff's visit.  6. Pre-diabetes We will check labs today. Kathryn Huff work on decreasing sugar sweetened beverages and work on weight loss efforts.  7. Other depression with emotional eating Kathryn Huff will consider restarting of Wellbutrin at her next visit.  8. Obesity, Current BMI 44.5 Kathryn Huff is currently in the action stage of change and her goal is to continue with weight loss efforts. I recommend Kathryn Huff begin the structured treatment plan as follows:  She has agreed to the Category 4 Plan and keeping a food journal and adhering to recommended goals of 1800 calories and 110 grams of protein daily.  Exercise goals: Walking, and tracking steps.   Behavioral modification strategies: increasing lean protein intake, increasing water intake, decreasing liquid calories, decreasing eating out, no skipping meals, meal planning and cooking strategies, and decreasing junk food.  She was informed of the importance of  frequent follow-up visits to maximize her success with intensive lifestyle modifications for her multiple health conditions. She was informed we would discuss her lab results at her next visit unless there is a critical issue that needs to be addressed sooner. Kathryn Huff agreed to keep her next visit at the agreed upon time to discuss these results.  Objective:   Blood pressure 109/75, pulse 77, temperature 97.7 F (36.5 C), height 5' 10"  (1.778 m), weight (!) 310 lb (140.6 kg), SpO2 98 %. Body mass index is 44.48 kg/m.  EKG: Normal sinus rhythm, rate 72 BPM.  Indirect Calorimeter completed today shows a VO2 of 359 and a REE of 2477.  Her calculated basal metabolic rate is 8366 thus her basal metabolic rate is better than expected.  General: Cooperative, alert, well developed, in no acute distress. HEENT: Conjunctivae and lids unremarkable. Cardiovascular: Regular rhythm.  Lungs: Normal work of breathing. Neurologic: No focal deficits.   Lab Results  Component Value Date   CREATININE 0.73 04/06/2022   BUN 11 04/06/2022   NA 138 04/06/2022   K 4.5 04/06/2022   CL 107 04/06/2022   CO2 26 04/06/2022   Lab Results  Component Value Date  ALT 20 04/06/2022   AST 14 04/06/2022   ALKPHOS 83 04/06/2022   BILITOT 0.4 04/06/2022   Lab Results  Component Value Date   HGBA1C 5.6 07/27/2022   HGBA1C 5.9 04/06/2022   HGBA1C 5.5 09/20/2017   HGBA1C 5.5 06/15/2017   HGBA1C 5.4 03/01/2017   Lab Results  Component Value Date   INSULIN 16.2 07/27/2022   INSULIN 30.4 (H) 09/20/2017   INSULIN 29.7 (H) 06/15/2017   INSULIN 32.5 (H) 03/01/2017   Lab Results  Component Value Date   TSH 1.28 04/06/2022   Lab Results  Component Value Date   CHOL 138 04/06/2022   HDL 51.90 04/06/2022   LDLCALC 59 04/06/2022   TRIG 136.0 04/06/2022   CHOLHDL 3 04/06/2022   Lab Results  Component Value Date   WBC 8.3 07/27/2022   HGB 13.4 07/27/2022   HCT 40.1 07/27/2022   MCV 86 07/27/2022   PLT  277 07/27/2022   No results found for: "IRON", "TIBC", "FERRITIN"  Attestation Statements:   Reviewed by clinician on day of visit: allergies, medications, problem list, medical history, surgical history, family history, social history, and previous encounter notes.   Wilhemena Durie, am acting as transcriptionist for Loyal Gambler, DO.  I have reviewed the above documentation for accuracy and completeness, and I agree with the above. Dell Ponto, DO

## 2022-08-10 ENCOUNTER — Ambulatory Visit (INDEPENDENT_AMBULATORY_CARE_PROVIDER_SITE_OTHER): Admitting: Family Medicine

## 2022-08-10 ENCOUNTER — Encounter (INDEPENDENT_AMBULATORY_CARE_PROVIDER_SITE_OTHER): Payer: Self-pay | Admitting: Family Medicine

## 2022-08-10 VITALS — BP 84/55 | HR 73 | Temp 97.9°F | Ht 70.0 in | Wt 305.0 lb

## 2022-08-10 DIAGNOSIS — Z6841 Body Mass Index (BMI) 40.0 and over, adult: Secondary | ICD-10-CM

## 2022-08-10 DIAGNOSIS — E559 Vitamin D deficiency, unspecified: Secondary | ICD-10-CM

## 2022-08-10 DIAGNOSIS — R7303 Prediabetes: Secondary | ICD-10-CM | POA: Diagnosis not present

## 2022-08-10 DIAGNOSIS — E669 Obesity, unspecified: Secondary | ICD-10-CM

## 2022-08-14 NOTE — Progress Notes (Unsigned)
Chief Complaint:   OBESITY Kathryn Huff is here to discuss her progress with her obesity treatment plan along with follow-up of her obesity related diagnoses. Kathryn Huff is on  Category 4 Plan and keeping a food journal and adhering to recommended goals of 1800 calories and 110 grams of protein daily.and states she is following her eating plan approximately 90% of the time. Kathryn Huff states she is walking 7,000 steps daily  4-5 times per week.  Today's visit was #: 2 Starting weight: 310 lbs Starting date: 07/27/2022 Today's weight: 305 lbs Today's date: 08/10/2022 Total lbs lost to date: 5 lbs Total lbs lost since last in-office visit: 5 lbs  Interim History: Doing well on Category 4 meal plan.  Had Kathryn Huff after work one night and felt bad about it.  Reading labels.  Feels adequately satisfied.  Husband is supportive.  She is gluten free with celiac disease.  Having chocolate cravings.  Doing better at work with steps and getting meals in.  Has a yard sale next week.   Subjective:   1. Vitamin D deficiency Discussed labs with patient today. 07/27/2022, Vitamin D 28.6 Prescription Vitamin D 50,000 IU weekly from PCP.   2. Prediabetes 07/27/2022 A1c 5.6 down from 5.9. Fasting insulin 16.2, has cut back on sugar intake.    Assessment/Plan:   1. Vitamin D deficiency Continue prescription Vitamin D weekly through PCP.   2. Prediabetes Continue low sugar diet, plan to increase home exercise plan and track steps.   3. Obesity, current BMI 43.8 Declined need for AOM's.  Not on birth control, is focused on dietary changes.   Suzi is currently in the action stage of change. As such, her goal is to continue with weight loss efforts. She has agreed to the Category 4 Plan and keeping a food journal and adhering to recommended goals of 700-800 calories daily.    Exercise goals:  Track steps.   Behavioral modification strategies: increasing lean protein intake, increasing vegetables,  increasing water intake, decreasing eating out, no skipping meals, better snacking choices, planning for success, and decreasing junk food.  Kathryn Huff has agreed to follow-up with our clinic in 3 weeks. She was informed of the importance of frequent follow-up visits to maximize her success with intensive lifestyle modifications for her multiple health conditions.   Objective:   Blood pressure (!) 84/55, pulse 73, temperature 97.9 F (36.6 C), height 5' 10"  (1.778 m), weight (!) 305 lb (138.3 kg), SpO2 100 %. Body mass index is 43.76 kg/m.  General: Cooperative, alert, well developed, in no acute distress. HEENT: Conjunctivae and lids unremarkable. Cardiovascular: Regular rhythm.  Lungs: Normal work of breathing. Neurologic: No focal deficits.   Lab Results  Component Value Date   CREATININE 0.73 04/06/2022   BUN 11 04/06/2022   NA 138 04/06/2022   K 4.5 04/06/2022   CL 107 04/06/2022   CO2 26 04/06/2022   Lab Results  Component Value Date   ALT 20 04/06/2022   AST 14 04/06/2022   ALKPHOS 83 04/06/2022   BILITOT 0.4 04/06/2022   Lab Results  Component Value Date   HGBA1C 5.6 07/27/2022   HGBA1C 5.9 04/06/2022   HGBA1C 5.5 09/20/2017   HGBA1C 5.5 06/15/2017   HGBA1C 5.4 03/01/2017   Lab Results  Component Value Date   INSULIN 16.2 07/27/2022   INSULIN 30.4 (H) 09/20/2017   INSULIN 29.7 (H) 06/15/2017   INSULIN 32.5 (H) 03/01/2017   Lab Results  Component Value Date  TSH 1.28 04/06/2022   Lab Results  Component Value Date   CHOL 138 04/06/2022   HDL 51.90 04/06/2022   LDLCALC 59 04/06/2022   TRIG 136.0 04/06/2022   CHOLHDL 3 04/06/2022   Lab Results  Component Value Date   VD25OH 28.6 (L) 07/27/2022   VD25OH 20.55 (L) 04/06/2022   VD25OH 33.7 09/20/2017   Lab Results  Component Value Date   WBC 8.3 07/27/2022   HGB 13.4 07/27/2022   HCT 40.1 07/27/2022   MCV 86 07/27/2022   PLT 277 07/27/2022   No results found for: "IRON", "TIBC",  "FERRITIN"   Attestation Statements:   Reviewed by clinician on day of visit: allergies, medications, problem list, medical history, surgical history, family history, social history, and previous encounter notes.  I, Davy Pique, am acting as Location manager for Loyal Gambler, DO.  I have reviewed the above documentation for accuracy and completeness, and I agree with the above. Dell Ponto, DO

## 2022-08-23 ENCOUNTER — Encounter (INDEPENDENT_AMBULATORY_CARE_PROVIDER_SITE_OTHER): Payer: Self-pay

## 2022-09-12 ENCOUNTER — Ambulatory Visit (INDEPENDENT_AMBULATORY_CARE_PROVIDER_SITE_OTHER): Admitting: Family Medicine

## 2022-09-21 ENCOUNTER — Other Ambulatory Visit (INDEPENDENT_AMBULATORY_CARE_PROVIDER_SITE_OTHER): Payer: Self-pay | Admitting: Family Medicine

## 2022-09-21 ENCOUNTER — Ambulatory Visit (INDEPENDENT_AMBULATORY_CARE_PROVIDER_SITE_OTHER): Admitting: Family Medicine

## 2022-09-21 VITALS — BP 126/86 | HR 68 | Temp 98.0°F | Ht 70.0 in | Wt 303.0 lb

## 2022-09-21 DIAGNOSIS — Z6841 Body Mass Index (BMI) 40.0 and over, adult: Secondary | ICD-10-CM

## 2022-09-21 DIAGNOSIS — R7303 Prediabetes: Secondary | ICD-10-CM

## 2022-09-21 DIAGNOSIS — E669 Obesity, unspecified: Secondary | ICD-10-CM | POA: Diagnosis not present

## 2022-09-21 DIAGNOSIS — E559 Vitamin D deficiency, unspecified: Secondary | ICD-10-CM

## 2022-09-21 DIAGNOSIS — F39 Unspecified mood [affective] disorder: Secondary | ICD-10-CM

## 2022-09-21 DIAGNOSIS — Z9189 Other specified personal risk factors, not elsewhere classified: Secondary | ICD-10-CM

## 2022-09-21 MED ORDER — TOPIRAMATE ER 25 MG PO CAP24: ORAL_CAPSULE | ORAL | 0 refills | Status: DC

## 2022-09-21 MED ORDER — SERTRALINE HCL 25 MG PO TABS: ORAL_TABLET | ORAL | 3 refills | Status: DC

## 2022-09-26 ENCOUNTER — Encounter (INDEPENDENT_AMBULATORY_CARE_PROVIDER_SITE_OTHER): Payer: Self-pay

## 2022-09-26 ENCOUNTER — Telehealth (INDEPENDENT_AMBULATORY_CARE_PROVIDER_SITE_OTHER): Payer: Self-pay | Admitting: Family Medicine

## 2022-09-26 NOTE — Telephone Encounter (Signed)
Dr. Raliegh Scarlet - Prior authorization denied for Topiramate. Per insurance: Coverage is provided where this drug is being prescribed for initial monotherapy of partial onset seizure or primary generalized tonic-clonic seizure in a patient 31 years of age and older; adjunctive therapy of partial onset seizure or primary generalized tonic-clonic seizure in a patient 6 years and older; Lennox-Gastaut seizure in a patient 6 years and older; or migraine prophylaxis in patients 7 years of age and older. Patient sent denial message via mychart.

## 2022-10-04 ENCOUNTER — Encounter (INDEPENDENT_AMBULATORY_CARE_PROVIDER_SITE_OTHER): Payer: Self-pay | Admitting: Family Medicine

## 2022-10-05 ENCOUNTER — Other Ambulatory Visit (INDEPENDENT_AMBULATORY_CARE_PROVIDER_SITE_OTHER): Payer: Self-pay | Admitting: Family Medicine

## 2022-10-05 ENCOUNTER — Ambulatory Visit (INDEPENDENT_AMBULATORY_CARE_PROVIDER_SITE_OTHER): Admitting: Family Medicine

## 2022-10-05 ENCOUNTER — Encounter (INDEPENDENT_AMBULATORY_CARE_PROVIDER_SITE_OTHER): Payer: Self-pay | Admitting: Family Medicine

## 2022-10-05 VITALS — BP 136/76 | HR 57 | Temp 98.2°F | Ht 70.0 in | Wt 303.8 lb

## 2022-10-05 DIAGNOSIS — E669 Obesity, unspecified: Secondary | ICD-10-CM

## 2022-10-05 DIAGNOSIS — R7303 Prediabetes: Secondary | ICD-10-CM | POA: Diagnosis not present

## 2022-10-05 DIAGNOSIS — Z6841 Body Mass Index (BMI) 40.0 and over, adult: Secondary | ICD-10-CM

## 2022-10-05 DIAGNOSIS — E66813 Obesity, class 3: Secondary | ICD-10-CM

## 2022-10-05 DIAGNOSIS — E559 Vitamin D deficiency, unspecified: Secondary | ICD-10-CM | POA: Diagnosis not present

## 2022-10-05 DIAGNOSIS — F39 Unspecified mood [affective] disorder: Secondary | ICD-10-CM

## 2022-10-05 MED ORDER — VITAMIN D (ERGOCALCIFEROL) 1.25 MG (50000 UNIT) PO CAPS: 50000.0000 [IU] | ORAL_CAPSULE | ORAL | 0 refills | Status: DC

## 2022-10-05 MED ORDER — TOPIRAMATE 50 MG PO TABS: ORAL_TABLET | ORAL | 0 refills | Status: DC

## 2022-10-10 ENCOUNTER — Ambulatory Visit (INDEPENDENT_AMBULATORY_CARE_PROVIDER_SITE_OTHER): Admitting: Family Medicine

## 2022-10-22 NOTE — Progress Notes (Unsigned)
Chief Complaint:   OBESITY Kathryn Huff is here to discuss her progress with her obesity treatment plan along with follow-up of her obesity related diagnoses. Kathryn Huff is on the Category 4 Plan and keeping a food journal and adhering to recommended goals of 1700-1800 calories and 110 grams protein and states she is following her eating plan approximately 50% of the time. Kathryn Huff states she is walking 15 minutes 5 times per week.  Today's visit was #: 3 Starting weight: 310 lbs Starting date: 07/27/2022 Today's weight: 303 lbs Today's date: 09/21/2022 Total lbs lost to date: 7 Total lbs lost since last in-office visit: 2  Interim History: This is Kathryn Huff's first OV with me. She works 8A-8P. And has lunch between 12-2pm and dinner is usually 6-7pm. 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 She sometimes has not time to for lunch, then ends up going to fast food places on her way home.  Subjective:   1. Prediabetes with cravings Pt's A1c was 5.9 on 04/06/22. She reports cravings, especially around her menses. Pt craves [employer] and chocolate. Her cravings are worse with skipping meals and when she eats off plan.  2. Mood disorder ([place])- with emotional eating Pt with history of depression and anxiety. Pt is tearful in office today. She is not on meds. Pt is dealing with a lot of personal stress and did not give specifics. She has no history of seeing a Social worker. Kathryn Huff denies suicidal or homicidal ideations.  3. Vitamin D deficiency Pt started Ergocalciferol at last OV with Dr. Valetta Close and is tolerating it well. She takes it every Monday and has no concerns.  4. At risk for depression Kathryn Huff is at elevated risk of depression due to one or more of the following: family history, significant life stressors, medical conditions and/or poor nutrition.  Assessment/Plan:  No orders of the defined types were placed in this encounter.   There are no discontinued medications.   Meds  ordered this encounter  Medications   DISCONTD: sertraline (ZOLOFT) 25 MG tablet    Sig: 1/2 tab qd with food * 1 week, if tol well, 1 po qd    Dispense:  30 tablet    Refill:  3   DISCONTD: Topiramate ER (TROKENDI XR) 25 MG CP24    Sig: 1 po qd    Dispense:  30 capsule    Refill:  0     1. Prediabetes with cravings Kathryn Huff will continue to work on weight loss, exercise, and decreasing simple carbohydrates to help decrease the risk of diabetes.  Pt failed Metformin due to side effects. Risks and benefits of Trokendi discussed with pt. Start Trokendi at 25 mg daily. Continue prudent nutritional plan with increase in protein and fiber and decrease simple carbs,  2. Mood disorder ([place])- with emotional eating Behavior modification techniques were discussed today to help Marializ deal with her emotional/non-hunger eating behaviors.  Orders and follow up as documented in patient record.  Risks and benefits of meds discussed with pt.  Start low dose Zoloft 12.5 mg daily. Go to [facility]- [facility] and 2 other counselor names given to pt.  - sertraline (ZOLOFT) 25 MG tablet; 1/2 tab qd with food * 1 week, if tol well, 1 po qd (Patient not taking: Reported on 10/05/2022)  Dispense: 30 tablet; Refill: 3  3. Vitamin D deficiency - I again reiterated the importance of vitamin D (as well as calcium) to their health and wellbeing.  -  I reviewed possible symptoms of low Vitamin D:  low energy, depressed mood, muscle aches, joint aches, osteoporosis etc. - low Vitamin D levels may be linked to an increased risk of cardiovascular events and even increased risk of cancers- such as colon and breast.  - ideal vitamin D levels reviewed with patient  - I recommend pt take a 50,000 IU weekly prescription vit D - see script below   - Informed patient this may be a lifelong thing, and she was encouraged to continue to take the medicine until told otherwise.    - weight loss will likely improve availability of vitamin D, thus  encouraged Kathryn Huff to continue with meal plan and their weight loss efforts to further improve this condition.  Thus, we will need to monitor levels regularly (every 3-4 mo on average) to keep levels within normal limits and prevent over supplementation. - pt's questions and concerns regarding this condition addressed.  4. At risk for depression Kathryn Huff was given approximately 9 minutes of depression prevention counseling today due to their higher than average risk for this condition. The patient has several risk factors for depression such as chronic medical conditions, sleep issues, major life stressors/events, etc., and we discussed these today.  Kathryn Huff was also counseled on the importance of a healthy work-life balance, a healthy relationship with food, and a good support system.  We discussed various strategies to help cope with these emotions as well.  I recommended counseling, meditation or prayer, healthy eating habits, sleep hygiene, and exercising to help manage these feelings.   5. Obesity, current BMI 43.5 Kathryn Huff is currently in the action stage of change. As such, her goal is to continue with weight loss efforts. She has agreed to the Category 4 Plan and keeping a food journal and adhering to recommended goals of 1700-1800 calories and 120+ grams protein.   Strategies to eat on plan and not skip reviewed with pt.  Exercise goals:  As is   Behavioral modification strategies: meal planning and cooking strategies, keeping healthy foods in the home, and planning for success.  Kathryn Huff has agreed to follow-up with our clinic in 3 weeks. She was informed of the importance of frequent follow-up visits to maximize her success with intensive lifestyle modifications for her multiple health conditions.   Objective:   Blood pressure 126/86, pulse 68, temperature 98 F (36.7 C), height 5' 10"  (1.778 m), weight (!) 303 lb (137.4 kg), SpO2 99 %. Body mass index is 43.48  kg/m.  General: Cooperative, alert, well developed, in no acute distress. HEENT: Conjunctivae and lids unremarkable. Cardiovascular: Regular rhythm.  Lungs: Normal work of breathing. Neurologic: No focal deficits.   Lab Results  Component Value Date   CREATININE 0.73 04/06/2022   BUN 11 04/06/2022   NA 138 04/06/2022   K 4.5 04/06/2022   CL 107 04/06/2022   CO2 26 04/06/2022   Lab Results  Component Value Date   ALT 20 04/06/2022   AST 14 04/06/2022   ALKPHOS 83 04/06/2022   BILITOT 0.4 04/06/2022   Lab Results  Component Value Date   HGBA1C 5.6 07/27/2022   HGBA1C 5.9 04/06/2022   HGBA1C 5.5 09/20/2017   HGBA1C 5.5 06/15/2017   HGBA1C 5.4 03/01/2017   Lab Results  Component Value Date   INSULIN 16.2 07/27/2022   INSULIN 30.4 (H) 09/20/2017   INSULIN 29.7 (H) 06/15/2017   INSULIN 32.5 (H) 03/01/2017   Lab Results  Component Value Date  TSH 1.28 04/06/2022   Lab Results  Component Value Date   CHOL 138 04/06/2022   HDL 51.90 04/06/2022   LDLCALC 59 04/06/2022   TRIG 136.0 04/06/2022   CHOLHDL 3 04/06/2022   Lab Results  Component Value Date   VD25OH 28.6 (L) 07/27/2022   VD25OH 20.55 (L) 04/06/2022   VD25OH 33.7 09/20/2017   Lab Results  Component Value Date   WBC 8.3 07/27/2022   HGB 13.4 07/27/2022   HCT 40.1 07/27/2022   MCV 86 07/27/2022   PLT 277 07/27/2022    Attestation Statements:   Reviewed by clinician on day of visit: allergies, medications, problem list, medical history, surgical history, family history, social history, and previous encounter notes.  I, Kathlene November, BS, CMA, am acting as transcriptionist for [employer], DO.   I have reviewed the above documentation for accuracy and completeness, and I agree with the above. Marjory Sneddon, D.O.  The [place] was signed into law in 2016 which includes the topic of electronic health records.  This provides immediate access to information in MyChart.  This  includes consultation notes, operative notes, office notes, lab results and pathology reports.  If you have any questions about what you read please let us know at your next visit so we can discuss your concerns and take corrective action if need be.  We are right here with you.

## 2022-10-25 ENCOUNTER — Ambulatory Visit (INDEPENDENT_AMBULATORY_CARE_PROVIDER_SITE_OTHER): Admitting: Family Medicine

## 2022-10-25 ENCOUNTER — Encounter (INDEPENDENT_AMBULATORY_CARE_PROVIDER_SITE_OTHER): Payer: Self-pay | Admitting: Family Medicine

## 2022-10-25 VITALS — BP 115/84 | HR 64 | Temp 98.5°F | Ht 70.0 in | Wt 298.0 lb

## 2022-10-25 DIAGNOSIS — R632 Polyphagia: Secondary | ICD-10-CM | POA: Diagnosis not present

## 2022-10-25 DIAGNOSIS — F419 Anxiety disorder, unspecified: Secondary | ICD-10-CM | POA: Diagnosis not present

## 2022-10-25 DIAGNOSIS — E669 Obesity, unspecified: Secondary | ICD-10-CM

## 2022-10-25 DIAGNOSIS — Z6841 Body Mass Index (BMI) 40.0 and over, adult: Secondary | ICD-10-CM

## 2022-10-25 DIAGNOSIS — E559 Vitamin D deficiency, unspecified: Secondary | ICD-10-CM | POA: Diagnosis not present

## 2022-10-25 HISTORY — DX: Polyphagia: R63.2

## 2022-10-25 MED ORDER — VITAMIN D (ERGOCALCIFEROL) 1.25 MG (50000 UNIT) PO CAPS: 50000.0000 [IU] | ORAL_CAPSULE | ORAL | 0 refills | Status: DC

## 2022-10-25 MED ORDER — TOPIRAMATE 50 MG PO TABS: ORAL_TABLET | ORAL | 0 refills | Status: DC

## 2022-10-25 MED ORDER — SERTRALINE HCL 25 MG PO TABS: ORAL_TABLET | ORAL | 0 refills | Status: DC

## 2022-10-25 NOTE — Progress Notes (Signed)
Chief Complaint:   OBESITY Kathryn Huff is here to discuss her progress with her obesity treatment plan along with follow-up of her obesity related diagnoses. Kathryn Huff is on the Category 3 Plan and states she is following her eating plan approximately 75% of the time. Kathryn Huff states she is walking 20 minutes 3-5 times per week.  Today's visit was #: 4 Starting weight: 310 lbs Starting date: 07/27/2022 Today's weight: 303 lbs Today's date: 10/03/2022 Total lbs lost to date: 7 Total lbs lost since last in-office visit: 0  Interim History: Kathryn Huff has been busy. As a family, she went to the beach this past weekend and had all foods off plan, but pt did [facility]. Pt is snacking between meals more. She has more cravings at night after dinner.  Subjective:   1. Prediabetes with cravings Kathryn Huff has a diagnosis of prediabetes based on her elevated HgA1c and was informed this puts her at greater risk of developing diabetes. She continues to work on diet and exercise to decrease her risk of diabetes. She denies nausea or hypoglycemia.  2. Mood disorder (Kathryn Huff)- with emotional eating Kathryn Huff didn't start Zoloft yet and hasn't picked it up from pharmacy yet. She has been too busy and didn't have money to get it.  3. Vitamin D deficiency She is currently taking prescription vitamin D 50,000 IU each week. She denies nausea, vomiting or muscle weakness.  Assessment/Plan:  No orders of the defined types were placed in this encounter.   Medications Discontinued During This Encounter  Medication Reason   Topiramate ER (TROKENDI XR) 25 MG CP24    Vitamin D, Ergocalciferol, (DRISDOL) 1.25 MG (50000 UNIT) CAPS capsule Reorder     Meds ordered this encounter  Medications   DISCONTD: Vitamin D, Ergocalciferol, (DRISDOL) 1.25 MG (50000 UNIT) CAPS capsule    Sig: Take 1 capsule (50,000 Units total) by mouth every 7 (seven) days.    Dispense:  4 capsule    Refill:  0   DISCONTD: topiramate (TOPAMAX) 50 MG  tablet    Sig: One half tab by mouth daily for a week, then 1/2 tab po BID    Dispense:  30 tablet    Refill:  0     1. Prediabetes with cravings Kathryn Huff will continue to work on weight loss, exercise, and decreasing simple carbohydrates to help decrease the risk of diabetes.  Discontinue Trokendi, as it is not covered by insurance. Start Topamax 25 mg daily for a week, then increase to 1/2 tab twice a day.  2. Mood disorder (Kathryn Huff)- with emotional eating Behavior modification techniques were discussed today to help Kathryn Huff deal with her emotional/non-hunger eating behaviors.  Orders and follow up as documented in patient record. Start Zoloft.  3. Vitamin D deficiency Low Vitamin D level contributes to fatigue and are associated with obesity, breast, and colon cancer. She agrees to continue to take prescription Vitamin D @50 ,000 IU every week and will follow-up for routine testing of Vitamin D, at least 2-3 times per year to avoid over-replacement.  4. Obesity, current BMI 43.6 Kathryn Huff is currently in the action stage of change. As such, her goal is to continue with weight loss efforts. She has agreed to change to the Category 3 Plan with 6 oz protein at lunch and 10-12 oz at dinner.   Exercise goals:  As is  Behavioral modification strategies: increasing lean protein intake and planning for success.  Kathryn Huff has agreed to follow-up with our clinic in 3 weeks. She  was informed of the importance of frequent follow-up visits to maximize her success with intensive lifestyle modifications for her multiple health conditions.   Objective:   Blood pressure 136/76, pulse (!) 57, temperature 98.2 F (36.8 C), height 5' 10"  (1.778 m), weight (!) 303 lb 12.8 oz (137.8 kg), SpO2 98 %. Body mass index is 43.59 kg/m.  General: Cooperative, alert, well developed, in no acute distress. HEENT: Conjunctivae and lids unremarkable. Cardiovascular: Regular rhythm.  Lungs: Normal work of  breathing. Neurologic: No focal deficits.   Lab Results  Component Value Date   CREATININE 0.73 04/06/2022   BUN 11 04/06/2022   NA 138 04/06/2022   K 4.5 04/06/2022   CL 107 04/06/2022   CO2 26 04/06/2022   Lab Results  Component Value Date   ALT 20 04/06/2022   AST 14 04/06/2022   ALKPHOS 83 04/06/2022   BILITOT 0.4 04/06/2022   Lab Results  Component Value Date   HGBA1C 5.6 07/27/2022   HGBA1C 5.9 04/06/2022   HGBA1C 5.5 09/20/2017   HGBA1C 5.5 06/15/2017   HGBA1C 5.4 03/01/2017   Lab Results  Component Value Date   INSULIN 16.2 07/27/2022   INSULIN 30.4 (H) 09/20/2017   INSULIN 29.7 (H) 06/15/2017   INSULIN 32.5 (H) 03/01/2017   Lab Results  Component Value Date   TSH 1.28 04/06/2022   Lab Results  Component Value Date   CHOL 138 04/06/2022   HDL 51.90 04/06/2022   LDLCALC 59 04/06/2022   TRIG 136.0 04/06/2022   CHOLHDL 3 04/06/2022   Lab Results  Component Value Date   VD25OH 28.6 (L) 07/27/2022   VD25OH 20.55 (L) 04/06/2022   VD25OH 33.7 09/20/2017   Lab Results  Component Value Date   WBC 8.3 07/27/2022   HGB 13.4 07/27/2022   HCT 40.1 07/27/2022   MCV 86 07/27/2022   PLT 277 07/27/2022    Attestation Statements:   Reviewed by clinician on day of visit: allergies, medications, problem list, medical history, surgical history, family history, social history, and previous encounter notes.  I, Kathlene November, BS, CMA, am acting as transcriptionist for [employer], DO.   I have reviewed the above documentation for accuracy and completeness, and I agree with the above. Marjory Sneddon, D.O.  The [place] was signed into law in 2016 which includes the topic of electronic health records.  This provides immediate access to information in MyChart.  This includes consultation notes, operative notes, office notes, lab results and pathology reports.  If you have any questions about what you read please let us know at your next visit  so we can discuss your concerns and take corrective action if need be.  We are right here with you.

## 2022-10-31 ENCOUNTER — Telehealth (INDEPENDENT_AMBULATORY_CARE_PROVIDER_SITE_OTHER): Payer: Self-pay | Admitting: *Deleted

## 2022-10-31 NOTE — Telephone Encounter (Signed)
Prior authorization done via cover my meds for patients Topiramate. Per cover my meds patients mediation is on the plan so no further PA needed.  Kathryn Huff [employer] Key: [id number] help? Call us at [phone] Outcome Additional Information Required Drug is covered by current benefit plan. No further PA activity needed.

## 2022-11-03 NOTE — Progress Notes (Signed)
Chief Complaint:   OBESITY Kathryn Huff is here to discuss her progress with her obesity treatment plan along with follow-up of her obesity related diagnoses. Kathryn Huff is on  change to the Category 3 Plan with 6 oz protein at lunch and 10-12 oz at dinner.   and states she is following her eating plan approximately 75% of the time. Kathryn Huff states she is walking 30 minutes 4 times per week.  Today's visit was #: 5 Starting weight: 310 lbs Starting date: 07/27/2022 Today's weight: 298 lbs Today's date: 10/25/2022 Total lbs lost to date: 12 lbs Total lbs lost since last in-office visit: 5 lbs  Interim History: She is doing better with the meal plan.  She is on Sertraline at night for anxiety, it is helping.  Able to focus more.  Better control over mindless eating.  Food impulse control better on Topamax.  Husband is supportive.  Meal planning and prepping, reduced meal out.   Subjective:   1. Polyphagia Improving on Topiramate 1/2 (50 mg tablet), po BID. She complains of nausea and dizziness if taking without food.   2. Anxiety Improving on Sertraline 25 mg in the evening, sleeping better.  Less emotional eating.  Good support from home.   3. Vitamin D deficiency She is currently taking prescription vitamin D 50,000 IU each week. She denies nausea, vomiting or muscle weakness. Energy level improving.   Assessment/Plan:   1. Polyphagia Use condoms and track menses.  Avoid pregnancy.    Refill - topiramate (TOPAMAX) 50 MG tablet; 1/2 tab po BID  Dispense: 30 tablet; Refill: 0  2. Anxiety Refill - sertraline (ZOLOFT) 25 MG tablet; 1 tab each evening  Dispense: 30 tablet; Refill: 0  3. Vitamin D deficiency Refill - Vitamin D, Ergocalciferol, (DRISDOL) 1.25 MG (50000 UNIT) CAPS capsule; Take 1 capsule (50,000 Units total) by mouth every 7 (seven) days.  Dispense: 5 capsule; Refill: 0  4. Obesity, current BMI 42.8 1) Increase water intake to 64 oz per day.  2) Reviewed Bioimpedance,  maintaining muscles, reducing body fat.   Kathryn Huff is currently in the action stage of change. As such, her goal is to continue with weight loss efforts. She has agreed to the Category 4 Plan.   Exercise goals: All adults should avoid inactivity. Some physical activity is better than none, and adults who participate in any amount of physical activity gain some health benefits.  Behavioral modification strategies: increasing lean protein intake, increasing vegetables, increasing water intake, increasing high fiber foods, decreasing eating out, no skipping meals, meal planning and cooking strategies, keeping healthy foods in the home, and planning for success.  Kathryn Huff has agreed to follow-up with our clinic in 2 weeks. She was informed of the importance of frequent follow-up visits to maximize her success with intensive lifestyle modifications for her multiple health conditions.   Objective:   Blood pressure 115/84, pulse 64, temperature 98.5 F (36.9 C), height 5' 10"  (1.778 m), weight 298 lb (135.2 kg), SpO2 97 %. Body mass index is 42.76 kg/m.  General: Cooperative, alert, well developed, in no acute distress. HEENT: Conjunctivae and lids unremarkable. Cardiovascular: Regular rhythm.  Lungs: Normal work of breathing. Neurologic: No focal deficits.   Lab Results  Component Value Date   CREATININE 0.73 04/06/2022   BUN 11 04/06/2022   NA 138 04/06/2022   K 4.5 04/06/2022   CL 107 04/06/2022   CO2 26 04/06/2022   Lab Results  Component Value Date   ALT 20  04/06/2022   AST 14 04/06/2022   ALKPHOS 83 04/06/2022   BILITOT 0.4 04/06/2022   Lab Results  Component Value Date   HGBA1C 5.6 07/27/2022   HGBA1C 5.9 04/06/2022   HGBA1C 5.5 09/20/2017   HGBA1C 5.5 06/15/2017   HGBA1C 5.4 03/01/2017   Lab Results  Component Value Date   INSULIN 16.2 07/27/2022   INSULIN 30.4 (H) 09/20/2017   INSULIN 29.7 (H) 06/15/2017   INSULIN 32.5 (H) 03/01/2017   Lab Results  Component Value  Date   TSH 1.28 04/06/2022   Lab Results  Component Value Date   CHOL 138 04/06/2022   HDL 51.90 04/06/2022   LDLCALC 59 04/06/2022   TRIG 136.0 04/06/2022   CHOLHDL 3 04/06/2022   Lab Results  Component Value Date   VD25OH 28.6 (L) 07/27/2022   VD25OH 20.55 (L) 04/06/2022   VD25OH 33.7 09/20/2017   Lab Results  Component Value Date   WBC 8.3 07/27/2022   HGB 13.4 07/27/2022   HCT 40.1 07/27/2022   MCV 86 07/27/2022   PLT 277 07/27/2022   No results found for: "IRON", "TIBC", "FERRITIN"  Attestation Statements:   Reviewed by clinician on day of visit: allergies, medications, problem list, medical history, surgical history, family history, social history, and previous encounter notes.  I, Davy Pique, am acting as Location manager for Loyal Gambler, DO.  I have reviewed the above documentation for accuracy and completeness, and I agree with the above. Dell Ponto, DO

## 2022-11-08 ENCOUNTER — Ambulatory Visit (INDEPENDENT_AMBULATORY_CARE_PROVIDER_SITE_OTHER): Admitting: Family Medicine

## 2022-11-08 ENCOUNTER — Encounter (INDEPENDENT_AMBULATORY_CARE_PROVIDER_SITE_OTHER): Payer: Self-pay | Admitting: Family Medicine

## 2022-11-08 VITALS — BP 122/84 | HR 68 | Temp 98.2°F | Ht 70.0 in | Wt 300.8 lb

## 2022-11-08 DIAGNOSIS — R7303 Prediabetes: Secondary | ICD-10-CM

## 2022-11-08 DIAGNOSIS — Z6841 Body Mass Index (BMI) 40.0 and over, adult: Secondary | ICD-10-CM

## 2022-11-08 DIAGNOSIS — E669 Obesity, unspecified: Secondary | ICD-10-CM | POA: Diagnosis not present

## 2022-11-08 DIAGNOSIS — R632 Polyphagia: Secondary | ICD-10-CM

## 2022-11-08 DIAGNOSIS — F39 Unspecified mood [affective] disorder: Secondary | ICD-10-CM

## 2022-11-30 ENCOUNTER — Encounter (INDEPENDENT_AMBULATORY_CARE_PROVIDER_SITE_OTHER): Payer: Self-pay | Admitting: Family Medicine

## 2022-11-30 ENCOUNTER — Ambulatory Visit (INDEPENDENT_AMBULATORY_CARE_PROVIDER_SITE_OTHER): Admitting: Family Medicine

## 2022-11-30 VITALS — BP 124/72 | HR 61 | Temp 97.8°F | Ht 70.0 in | Wt 302.0 lb

## 2022-11-30 DIAGNOSIS — R632 Polyphagia: Secondary | ICD-10-CM | POA: Diagnosis not present

## 2022-11-30 DIAGNOSIS — E669 Obesity, unspecified: Secondary | ICD-10-CM | POA: Diagnosis not present

## 2022-11-30 DIAGNOSIS — Z6841 Body Mass Index (BMI) 40.0 and over, adult: Secondary | ICD-10-CM

## 2022-11-30 DIAGNOSIS — F411 Generalized anxiety disorder: Secondary | ICD-10-CM | POA: Diagnosis not present

## 2022-11-30 DIAGNOSIS — E559 Vitamin D deficiency, unspecified: Secondary | ICD-10-CM

## 2022-11-30 DIAGNOSIS — Z6839 Body mass index (BMI) 39.0-39.9, adult: Secondary | ICD-10-CM

## 2022-12-06 NOTE — Progress Notes (Signed)
Chief Complaint:   OBESITY Kathryn Huff is here to discuss her progress with her obesity treatment plan along with follow-up of her obesity related diagnoses. Kathryn Huff is on the Category 4 Plan and states she is following her eating plan approximately 75% of the time. Kathryn Huff states she is walking 30 minutes 4 times per week.  Today's visit was #: 6 Starting weight: 310 LBS Starting date: 07/27/2022 Today's weight: 300 LBS Today's date: 11/08/2022 Total lbs lost to date: 10 LBS Total lbs lost since last in-office visit: +2 LBS  Interim History: Patient has increased stress at home and at work.  She is skipping food on plan due to stressors at work.  She has increased her water intake, which she is proud of.  She also is still exercising to help with stressors.  She denies any issues with meal plan, but with holidays, she had more often when eating.  Subjective:   1. Pre-diabetes On 07/27/2022, A1c had improved to 5.6, down from 5.9.  Fasting insulin last checked 07/27/2022 at 16.2, down from 30.4.  No significant physical cravings when eating on plan.  2. Polyphagia Patient started Topamax on 10/05/2022, patient reports it is working well with decreased emotional eating and impulses to eat when stressed.  3. Mood disorder (Kathryn Huff)- with emotional eating Patient started Zoloft on 10/05/2022.  Patient is feeling happy year and is able to discuss emotions easier.  Patient endorses she had a much better headspace and has less emotional eating now.  Assessment/Plan:  No orders of the defined types were placed in this encounter.   There are no discontinued medications.   No orders of the defined types were placed in this encounter.    1. Pre-diabetes Continue PNP and weight loss, and continue exercising.  2. Polyphagia No need for a refill today per patient.  Continue Topamax and follow meal plan with increased protein and fiber.  Continue to push water intake.  3. Mood disorder ([place])- with  emotional eating No need for refill, she has a prescription for pickup.  Continue PNP, exercise for stress management.  Continue to get adequate sleep and continue medication.  4. Obesity, current BMI 43.2 Kathryn Huff is currently in the action stage of change. As such, her goal is to continue with weight loss efforts. She has agreed to the Category 4 Plan.   Exercise goals:  As is.  Behavioral modification strategies: emotional eating strategies and planning for success.  Kathryn Huff has agreed to follow-up with our clinic in 3 weeks. She was informed of the importance of frequent follow-up visits to maximize her success with intensive lifestyle modifications for her multiple health conditions.   Objective:   Blood pressure 122/84, pulse 68, temperature 98.2 F (36.8 C), height 5' 10"  (1.778 m), weight (!) 300 lb 12.8 oz (136.4 kg), SpO2 98 %. Body mass index is 43.16 kg/m.  General: Cooperative, alert, well developed, in no acute distress. HEENT: Conjunctivae and lids unremarkable. Cardiovascular: Regular rhythm.  Lungs: Normal work of breathing. Neurologic: No focal deficits.   Lab Results  Component Value Date   CREATININE 0.73 04/06/2022   BUN 11 04/06/2022   NA 138 04/06/2022   K 4.5 04/06/2022   CL 107 04/06/2022   CO2 26 04/06/2022   Lab Results  Component Value Date   ALT 20 04/06/2022   AST 14 04/06/2022   ALKPHOS 83 04/06/2022   BILITOT 0.4 04/06/2022   Lab Results  Component Value Date   HGBA1C 5.6  07/27/2022   HGBA1C 5.9 04/06/2022   HGBA1C 5.5 09/20/2017   HGBA1C 5.5 06/15/2017   HGBA1C 5.4 03/01/2017   Lab Results  Component Value Date   INSULIN 16.2 07/27/2022   INSULIN 30.4 (H) 09/20/2017   INSULIN 29.7 (H) 06/15/2017   INSULIN 32.5 (H) 03/01/2017   Lab Results  Component Value Date   TSH 1.28 04/06/2022   Lab Results  Component Value Date   CHOL 138 04/06/2022   HDL 51.90 04/06/2022   LDLCALC 59 04/06/2022   TRIG 136.0 04/06/2022   CHOLHDL 3  04/06/2022   Lab Results  Component Value Date   VD25OH 28.6 (L) 07/27/2022   VD25OH 20.55 (L) 04/06/2022   VD25OH 33.7 09/20/2017   Lab Results  Component Value Date   WBC 8.3 07/27/2022   HGB 13.4 07/27/2022   HCT 40.1 07/27/2022   MCV 86 07/27/2022   PLT 277 07/27/2022   No results found for: "IRON", "TIBC", "FERRITIN"  Attestation Statements:   Reviewed by clinician on day of visit: allergies, medications, problem list, medical history, surgical history, family history, social history, and previous encounter notes.  I, Davy Pique, RMA, am acting as Location manager for [employer], DO.  I have reviewed the above documentation for accuracy and completeness, and I agree with the above. Kathryn Huff, D.O.  The [place] was signed into law in 2016 which includes the topic of electronic health records.  This provides immediate access to information in MyChart.  This includes consultation notes, operative notes, office notes, lab results and pathology reports.  If you have any questions about what you read please let us know at your next visit so we can discuss your concerns and take corrective action if need be.  We are right here with you.

## 2022-12-08 NOTE — Progress Notes (Signed)
Chief Complaint:   OBESITY Kathryn Huff is here to discuss her progress with her obesity treatment plan along with follow-up of her obesity related diagnoses. Kathryn Huff is on the Category 4 Plan and states she is following her eating plan approximately 50% of the time. Kathryn Huff states she is exercising.  Today's visit was #: 7 Starting weight: 310 LBS Starting date: 07/27/2022 Today's weight: 302 LBS Today's date: 11/30/2022 Total lbs lost to date: 8 LBS Total lbs lost since last in-office visit: +2 LBS  Interim History: Patient has had a URI and family had the flu this past week.  She has had little appetite, but hydrating well with water and sugar-free liquid IV.  She is sleeping better.  She has not been able to exercise.  She has a WII to do for exercise.  Subjective:   1. GAD (generalized anxiety disorder) Patient has improved on sertraline 25 mg nightly.  Patient did not take while sick for 1 week.  Patient plans to get back on this week.  Anxiety increased while off her medication  2. Polyphagia Improved on Topamax 50 mg half tab twice daily without adverse side effects.  Patient has been tracking menses and using condoms.  Patient has not picked up her last prescription.  3. Vitamin D deficiency Patient is on prescription vitamin D 50,000 IU weekly.  (Has been off for 2 weeks).  Last vitamin D level was 28.6 on 07/27/2022.  Assessment/Plan:   1. GAD (generalized anxiety disorder) Resume sertraline 25 mg nightly.  2. Polyphagia Resume Topamax 50 mg half tab twice daily.  3. Vitamin D deficiency Kathryn Huff prescription vitamin D 50,000 IU weekly.  Recheck vitamin D level in January.  4. Obesity,current BMI 43.3 +3 lb of body fat since last visit.  Begin indoor exercise in January.    Kathryn Huff is currently in the action stage of change. As such, her goal is to continue with weight loss efforts. She has agreed to the Category 4 Plan.   Exercise goals: All adults should avoid  inactivity. Some physical activity is better than none, and adults who participate in any amount of physical activity gain some health benefits.  Behavioral modification strategies: increasing lean protein intake, increasing vegetables, increasing water intake, meal planning and cooking strategies, keeping healthy foods in the home, and holiday eating strategies .  Kathryn Huff has agreed to follow-up with our clinic in 2 weeks. She was informed of the importance of frequent follow-up visits to maximize her success with intensive lifestyle modifications for her multiple health conditions.   Objective:   Blood pressure 124/72, pulse 61, temperature 97.8 F (36.6 C), height 5' 10"  (1.778 m), weight (!) 302 lb (137 kg), SpO2 97 %. Body mass index is 43.33 kg/m.  General: Cooperative, alert, well developed, in no acute distress. HEENT: Conjunctivae and lids unremarkable. Cardiovascular: Regular rhythm.  Lungs: Normal work of breathing. Neurologic: No focal deficits.   Lab Results  Component Value Date   CREATININE 0.73 04/06/2022   BUN 11 04/06/2022   NA 138 04/06/2022   K 4.5 04/06/2022   CL 107 04/06/2022   CO2 26 04/06/2022   Lab Results  Component Value Date   ALT 20 04/06/2022   AST 14 04/06/2022   ALKPHOS 83 04/06/2022   BILITOT 0.4 04/06/2022   Lab Results  Component Value Date   HGBA1C 5.6 07/27/2022   HGBA1C 5.9 04/06/2022   HGBA1C 5.5 09/20/2017   HGBA1C 5.5 06/15/2017   HGBA1C 5.4 03/01/2017  Lab Results  Component Value Date   INSULIN 16.2 07/27/2022   INSULIN 30.4 (H) 09/20/2017   INSULIN 29.7 (H) 06/15/2017   INSULIN 32.5 (H) 03/01/2017   Lab Results  Component Value Date   TSH 1.28 04/06/2022   Lab Results  Component Value Date   CHOL 138 04/06/2022   HDL 51.90 04/06/2022   LDLCALC 59 04/06/2022   TRIG 136.0 04/06/2022   CHOLHDL 3 04/06/2022   Lab Results  Component Value Date   VD25OH 28.6 (L) 07/27/2022   VD25OH 20.55 (L) 04/06/2022   VD25OH  33.7 09/20/2017   Lab Results  Component Value Date   WBC 8.3 07/27/2022   HGB 13.4 07/27/2022   HCT 40.1 07/27/2022   MCV 86 07/27/2022   PLT 277 07/27/2022   No results found for: "IRON", "TIBC", "FERRITIN"  Attestation Statements:   Reviewed by clinician on day of visit: allergies, medications, problem list, medical history, surgical history, family history, social history, and previous encounter notes.  I, Davy Pique, am acting as Location manager for Loyal Gambler, DO.  I have reviewed the above documentation for accuracy and completeness, and I agree with the above. Dell Ponto, DO

## 2022-12-20 ENCOUNTER — Ambulatory Visit (INDEPENDENT_AMBULATORY_CARE_PROVIDER_SITE_OTHER): Admitting: Family Medicine

## 2023-01-11 ENCOUNTER — Ambulatory Visit (INDEPENDENT_AMBULATORY_CARE_PROVIDER_SITE_OTHER): Admitting: Family Medicine

## 2023-01-17 ENCOUNTER — Ambulatory Visit (INDEPENDENT_AMBULATORY_CARE_PROVIDER_SITE_OTHER): Admitting: Family Medicine

## 2023-02-09 ENCOUNTER — Ambulatory Visit (INDEPENDENT_AMBULATORY_CARE_PROVIDER_SITE_OTHER): Admitting: Family Medicine

## 2023-02-13 ENCOUNTER — Ambulatory Visit (INDEPENDENT_AMBULATORY_CARE_PROVIDER_SITE_OTHER): Admitting: Family Medicine

## 2023-02-13 ENCOUNTER — Encounter (INDEPENDENT_AMBULATORY_CARE_PROVIDER_SITE_OTHER): Payer: Self-pay | Admitting: Family Medicine

## 2023-02-13 VITALS — BP 117/81 | HR 58 | Temp 98.1°F | Ht 70.0 in | Wt 307.0 lb

## 2023-02-13 DIAGNOSIS — R7303 Prediabetes: Secondary | ICD-10-CM

## 2023-02-13 DIAGNOSIS — Z6841 Body Mass Index (BMI) 40.0 and over, adult: Secondary | ICD-10-CM | POA: Diagnosis not present

## 2023-02-13 DIAGNOSIS — E559 Vitamin D deficiency, unspecified: Secondary | ICD-10-CM | POA: Diagnosis not present

## 2023-02-13 MED ORDER — VITAMIN D (ERGOCALCIFEROL) 1.25 MG (50000 UNIT) PO CAPS: 50000.0000 [IU] | ORAL_CAPSULE | ORAL | 0 refills | Status: DC

## 2023-02-13 NOTE — Assessment & Plan Note (Signed)
>>  ASSESSMENT AND PLAN FOR PRE-DIABETES WRITTEN ON 02/13/2023  8:14 PM BY BOWEN, KAREN E, DO  Lab Results  Component Value Date   HGBA1C 5.6 07/27/2022   Last A1c normal Actively working on weight reduction Limit foods/ drinks with over 8 g of sugar per serving Plan to increase walking time this Spring.

## 2023-02-13 NOTE — Assessment & Plan Note (Signed)
Last vitamin D Lab Results  Component Value Date   VD25OH 28.6 (L) 07/27/2022   Taking RX vitamin D 50,000 IU weekly Target level 50-70 Due for level (lab closed today) Energy level has been low with recent illness  Recheck vitamin D level next visit

## 2023-02-13 NOTE — Progress Notes (Signed)
Office: [phone]  /  Fax: [phone]  WEIGHT SUMMARY AND BIOMETRICS  Vitals Temp: 98.1 F (36.7 C) BP: 117/81 Pulse Rate: (!) 58 SpO2: 99 %   Anthropometric Measurements Height: '5\' 10"'$  (1.778 m) Weight: (!) 307 lb (139.3 kg) BMI (Calculated): 44.05 Weight at Last Visit: 302lb Weight Lost Since Last Visit: +5 Starting Weight: 310lb Total Weight Loss (lbs): 3 lb (1.361 kg)   Body Composition  Body Fat %: 47.9 % Fat Mass (lbs): 147.4 lbs Muscle Mass (lbs): 152.2 lbs Total Body Water (lbs): 101 lbs Visceral Fat Rating : 13   Other Clinical Data Fasting: no Labs: yes Today's Visit #: 8 Starting Date: 07/27/22     HPI  Chief Complaint: OBESITY  Kathryn Huff is here to discuss her progress with her obesity treatment plan. She is on the the Category 4 Plan and states she is following her eating plan approximately 50 % of the time. She states she is exercising 30 minutes 2-3 times per week.   Interval History:  Since last office visit she is up 5 lb She has had financial strain and has been sick over the past few months. She felt 'like a zombie' on sertraline and topamax She has been buying cheaper food.  Trying to eat out less. While sick, she hasn't wanted to do more for exercise but plans to do more Looking forward to having another baby next year Son is 46 yo and daughter is 1.5 yo.  Budget friendly gluten free diet options reviewed Stay OFF of Sertraline and Topamax  Pharmacotherapy: none  PHYSICAL EXAM:  Blood pressure 117/81, pulse (!) 58, temperature 98.1 F (36.7 C), height '5\' 10"'$  (1.778 m), weight (!) 307 lb (139.3 kg), SpO2 99 %. Body mass index is 44.05 kg/m.  General: She is overweight, cooperative, alert, well developed, and in no acute distress. PSYCH: Has normal mood, affect and thought process.   HEENT: EOMI, sclerae are anicteric. Lungs: Normal breathing effort, no conversational dyspnea. Extremities: No edema.  Neurologic: No gross  sensory or motor deficits. No tremors or fasciculations noted.    DIAGNOSTIC DATA REVIEWED:  BMET    Component Value Date/Time   NA 138 04/06/2022 1130   NA 141 09/20/2017 0940   K 4.5 04/06/2022 1130   CL 107 04/06/2022 1130   CO2 26 04/06/2022 1130   GLUCOSE 111 (H) 04/06/2022 1130   BUN 11 04/06/2022 1130   BUN 15 09/20/2017 0940   CREATININE 0.73 04/06/2022 1130   CREATININE 0.79 05/30/2014 0859   CALCIUM 8.5 04/06/2022 1130   GFRNONAA >60 02/22/2018 1541   GFRNONAA >89 05/30/2014 0859   GFRAA >60 02/22/2018 1541   GFRAA >89 05/30/2014 0859   Lab Results  Component Value Date   HGBA1C 5.6 07/27/2022   HGBA1C 5.4 03/01/2017   Lab Results  Component Value Date   INSULIN 16.2 07/27/2022   INSULIN 32.5 (H) 03/01/2017   Lab Results  Component Value Date   TSH 1.28 04/06/2022   CBC    Component Value Date/Time   WBC 8.3 07/27/2022 0940   WBC 10.6 (H) 02/22/2018 1541   RBC 4.64 07/27/2022 0940   RBC 4.63 02/22/2018 1541   HGB 13.4 07/27/2022 0940   HCT 40.1 07/27/2022 0940   PLT 277 07/27/2022 0940   MCV 86 07/27/2022 0940   MCH 28.9 07/27/2022 0940   MCH 28.7 02/22/2018 1541   MCHC 33.4 07/27/2022 0940   MCHC 33.8 02/22/2018 1541   RDW 12.9 07/27/2022 0940  Iron Studies No results found for: "IRON", "TIBC", "FERRITIN", "IRONPCTSAT" Lipid Panel     Component Value Date/Time   CHOL 138 04/06/2022 1130   CHOL 179 09/20/2017 0940   TRIG 136.0 04/06/2022 1130   HDL 51.90 04/06/2022 1130   HDL 54 09/20/2017 0940   CHOLHDL 3 04/06/2022 1130   VLDL 27.2 04/06/2022 1130   LDLCALC 59 04/06/2022 1130   LDLCALC 94 09/20/2017 0940   Hepatic Function Panel     Component Value Date/Time   PROT 6.5 04/06/2022 1130   PROT 6.9 09/20/2017 0940   ALBUMIN 3.6 04/06/2022 1130   ALBUMIN 3.9 09/20/2017 0940   AST 14 04/06/2022 1130   ALT 20 04/06/2022 1130   ALKPHOS 83 04/06/2022 1130   BILITOT 0.4 04/06/2022 1130   BILITOT 0.3 09/20/2017 0940   BILIDIR 0.1  08/26/2015 0751   IBILI 0.3 05/30/2014 0859      Component Value Date/Time   TSH 1.28 04/06/2022 1130   Nutritional Lab Results  Component Value Date   VD25OH 28.6 (L) 07/27/2022   VD25OH 20.55 (L) 04/06/2022   VD25OH 33.7 09/20/2017     ASSESSMENT AND PLAN  TREATMENT PLAN FOR OBESITY:  Recommended Dietary Goals  Kathryn Huff is currently in the action stage of change. As such, her goal is to continue weight management plan. She has agreed to practicing portion control and making smarter food choices, such as increasing vegetables and decreasing simple carbohydrates.  Behavioral Intervention  We discussed the following Behavioral Modification Strategies today: increasing lean protein intake, increasing vegetables, increasing lower sugar fruits, increasing fiber rich foods, increasing water intake, and work on meal planning and easy cooking plans.  Additional resources provided today: NA  Recommended Physical Activity Goals  Kathryn Huff has been advised to work up to 150 minutes of moderate intensity aerobic activity a week and strengthening exercises 2-3 times per week for cardiovascular health, weight loss maintenance and preservation of muscle mass.   She has agreed to Will begin regular aerobic exercise 30 minutes, 3 times per week. Chosen activity walking.   Pharmacotherapy We discussed various medication options to help Kathryn Huff with her weight loss efforts and we both agreed to none.  ASSOCIATED CONDITIONS ADDRESSED TODAY  Vitamin D deficiency Assessment & Plan: Last vitamin D Lab Results  Component Value Date   VD25OH 28.6 (L) 07/27/2022   Taking RX vitamin D 50,000 IU weekly Target level 50-70 Due for level (lab closed today) Energy level has been low with recent illness  Recheck vitamin D level next visit  Orders: -     Vitamin D (Ergocalciferol); Take 1 capsule (50,000 Units total) by mouth every 7 (seven) days.  Dispense: 5 capsule; Refill: 0  Morbid obesity  (HCC)  BMI 40.0-44.9, adult ([place])  Pre-diabetes Assessment & Plan: Lab Results  Component Value Date   HGBA1C 5.6 07/27/2022   Last A1c normal Actively working on weight reduction Limit foods/ drinks with over 8 g of sugar per serving Plan to increase walking time this Spring.        No follow-ups on file.Marland Kitchen She was informed of the importance of frequent follow up visits to maximize her success with intensive lifestyle modifications for her multiple health conditions.   ATTESTASTION STATEMENTS:  Reviewed by clinician on day of visit: allergies, medications, problem list, medical history, surgical history, family history, social history, and previous encounter notes.   I have personally spent 30 minutes total time today in preparation, patient care, nutritional counseling and documentation for  this visit, including the following: review of clinical lab tests; review of medical tests/procedures/services.      Dell Ponto, DO

## 2023-02-13 NOTE — Assessment & Plan Note (Signed)
Lab Results  Component Value Date   HGBA1C 5.6 07/27/2022   Last A1c normal Actively working on weight reduction Limit foods/ drinks with over 8 g of sugar per serving Plan to increase walking time this Spring.

## 2023-03-13 ENCOUNTER — Ambulatory Visit (INDEPENDENT_AMBULATORY_CARE_PROVIDER_SITE_OTHER): Admitting: Family Medicine

## 2023-03-28 ENCOUNTER — Encounter (INDEPENDENT_AMBULATORY_CARE_PROVIDER_SITE_OTHER): Payer: Self-pay | Admitting: Family Medicine

## 2023-03-28 ENCOUNTER — Ambulatory Visit (INDEPENDENT_AMBULATORY_CARE_PROVIDER_SITE_OTHER): Admitting: Family Medicine

## 2023-03-28 ENCOUNTER — Other Ambulatory Visit (HOSPITAL_BASED_OUTPATIENT_CLINIC_OR_DEPARTMENT_OTHER): Payer: Self-pay

## 2023-03-28 VITALS — BP 123/83 | HR 64 | Temp 98.3°F | Ht 70.0 in | Wt 310.0 lb

## 2023-03-28 DIAGNOSIS — R632 Polyphagia: Secondary | ICD-10-CM

## 2023-03-28 DIAGNOSIS — Z6841 Body Mass Index (BMI) 40.0 and over, adult: Secondary | ICD-10-CM

## 2023-03-28 DIAGNOSIS — E559 Vitamin D deficiency, unspecified: Secondary | ICD-10-CM | POA: Diagnosis not present

## 2023-03-28 DIAGNOSIS — E88819 Insulin resistance, unspecified: Secondary | ICD-10-CM

## 2023-03-28 MED ORDER — WEGOVY 0.25 MG/0.5ML ~~LOC~~ SOAJ
0.2500 mg | SUBCUTANEOUS | 0 refills | Status: DC
  Filled 2023-03-28 – 2023-03-29 (×3): qty 2, 28d supply, fill #0

## 2023-03-28 NOTE — Progress Notes (Signed)
Office: [phone]  /  Fax: [phone]  WEIGHT SUMMARY AND BIOMETRICS  Starting Date: 07/27/22  Starting Weight: 310lb   Weight Lost Since Last Visit: 0   Vitals Temp: 98.3 F (36.8 C) BP: 123/83 Pulse Rate: 64 SpO2: 99 %   Body Composition  Body Fat %: 47.6 % Fat Mass (lbs): 147.6 lbs Muscle Mass (lbs): 154.2 lbs Total Body Water (lbs): 102.6 lbs Visceral Fat Rating : 13   HPI  Chief Complaint: OBESITY  Kathryn Huff is here to discuss her progress with her obesity treatment plan. She is on the the Category 4 Plan and states she is following her eating plan approximately 75 % of the time. She states she is exercising 15 minutes 3 times per week.   Interval History:  Since last office visit she is up 3 lb She did gain 2.2 lb of muscle mass and gained 0.2 lb of body fat Her water weight is up 1.6 lb She has been working more and this leads to a struggle getting in all of her meals She is getting hungrier at night She is mindful about getting in more lean protein and fiber She is getting 7,000-10,000 steps on work days She has a net weight gain of 3 lb in 8 mos She is off birth control but is not trying to conceive-- tracking periods  Pharmacotherapy: none  PHYSICAL EXAM:  Blood pressure 123/83, pulse 64, temperature 98.3 F (36.8 C), height 5\' 10"  (1.778 m), weight (!) 310 lb (140.6 kg), SpO2 99 %. Body mass index is 44.48 kg/m.  General: She is overweight, cooperative, alert, well developed, and in no acute distress. PSYCH: Has normal mood, affect and thought process.   Lungs: Normal breathing effort, no conversational dyspnea.   ASSESSMENT AND PLAN  TREATMENT PLAN FOR OBESITY:  Recommended Dietary Goals  Kathryn Huff is currently in the action stage of change. As such, her goal is to continue weight management plan. She has agreed to the Category 4 Plan.  Behavioral Intervention  We discussed the following Behavioral Modification Strategies today:  increasing lean protein intake, avoiding skipping meals, increasing water intake, work on meal planning and preparation, reading food labels , decreasing eating out or consumption of processed foods, and making healthy choices when eating convenient foods, emotional eating strategies and understanding the difference between hunger signals and cravings, work on managing stress, creating time for self-care and relaxation measures, and avoiding temptations and identifying enticing environmental cues.  Additional resources provided today: NA  Recommended Physical Activity Goals  Kathryn Huff has been advised to work up to 150 minutes of moderate intensity aerobic activity a week and strengthening exercises 2-3 times per week for cardiovascular health, weight loss maintenance and preservation of muscle mass.   She has agreed to Increase physical activity in their day and reduce sedentary time (increase NEAT).  Pharmacotherapy changes for the treatment of obesity: Wegovy 0.25 mg weekly   ASSOCIATED CONDITIONS ADDRESSED TODAY  Polyphagia Assessment & Plan: Worsening Even with eating on a schedule and focusing on lean protein and fiber with meals, she has been hungrier in the evening.  This could be due to lack of sleep, increased stress from work and consumption of more highly palatable processed foods.  We discussed the importance of eating on a schedule, planning out meals especially on long workdays and avoiding excess meals out and high sugar items.  She is Candidate for use of antiobesity medication given her lack of weight loss over the past 8 months.  We discussed the importance of avoiding pregnancy while on antiobesity medication.  She will continue logging her monthly cycles.  Begin Wegovy 0.25 mg once weekly injection. Patient denies a personal or family history of pancreatitis, medullary thyroid carcinoma or multiple endocrine neoplasia type II. Recommend reviewing pen training video  online.   Orders: -     Wegovy; Inject 0.25 mg into the skin once a week.  Dispense: 2 mL; Refill: 0  Morbid obesity -     WYSHUO; Inject 0.25 mg into the skin once a week.  Dispense: 2 mL; Refill: 0  Vitamin D deficiency Assessment & Plan: Last vitamin D Lab Results  Component Value Date   VD25OH 28.6 (L) 07/27/2022   She has been taking prescription vitamin D 50,000 IU once weekly.  Her energy level is improving.  She denies GI side effects.  Plan: Check B12 level today.  Target goal is 50-70.  Orders: -     VITAMIN D 25 Hydroxy (Vit-D Deficiency, Fractures)  BMI 40.0-44.9, adult  Insulin resistance Assessment & Plan: Last fasting insulin elevated at 16.2.  She has declined use of metformin.  She has a history of prediabetes.  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  Continue working on a low sugar/lower carbohydrate diet, eating on a schedule including lean protein and fiber with meals. Plan to recheck fasting insulin in the next 2 to 3 months.       She was informed of the importance of frequent follow up visits to maximize her success with intensive lifestyle modifications for her multiple health conditions.   ATTESTASTION STATEMENTS:  Reviewed by clinician on day of visit: allergies, medications, problem list, medical history, surgical history, family history, social history, and previous encounter notes pertinent to obesity diagnosis.   I have personally spent 30 minutes total time today in preparation, patient care, nutritional counseling and documentation for this visit, including the following: review of clinical lab tests; review of medical tests/procedures/services.      Glennis Brink, DO DABFM, DABOM [facility] [address] [place], [place] [address] [phone]

## 2023-03-28 NOTE — Assessment & Plan Note (Signed)
Last fasting insulin elevated at 16.2.  She has declined use of metformin.  She has a history of prediabetes.  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  Continue working on a low sugar/lower carbohydrate diet, eating on a schedule including lean protein and fiber with meals. Plan to recheck fasting insulin in the next 2 to 3 months.

## 2023-03-28 NOTE — Assessment & Plan Note (Signed)
Worsening Even with eating on a schedule and focusing on lean protein and fiber with meals, she has been hungrier in the evening.  This could be due to lack of sleep, increased stress from work and consumption of more highly palatable processed foods.  We discussed the importance of eating on a schedule, planning out meals especially on long workdays and avoiding excess meals out and high sugar items.  She is Candidate for use of antiobesity medication given her lack of weight loss over the past 8 months.  We discussed the importance of avoiding pregnancy while on antiobesity medication.  She will continue logging her monthly cycles.  Begin Wegovy 0.25 mg once weekly injection. Patient denies a personal or family history of pancreatitis, medullary thyroid carcinoma or multiple endocrine neoplasia type II. Recommend reviewing pen training video online.

## 2023-03-28 NOTE — Assessment & Plan Note (Signed)
Last vitamin D Lab Results  Component Value Date   VD25OH 28.6 (L) 07/27/2022   She has been taking prescription vitamin D 50,000 IU once weekly.  Her energy level is improving.  She denies GI side effects.  Plan: Check B12 level today.  Target goal is 50-70.

## 2023-03-29 ENCOUNTER — Other Ambulatory Visit (HOSPITAL_COMMUNITY): Payer: Self-pay

## 2023-03-29 ENCOUNTER — Other Ambulatory Visit (HOSPITAL_BASED_OUTPATIENT_CLINIC_OR_DEPARTMENT_OTHER): Payer: Self-pay

## 2023-03-29 LAB — VITAMIN D 25 HYDROXY (VIT D DEFICIENCY, FRACTURES): Vit D, 25-Hydroxy: 19.4 ng/mL — ABNORMAL LOW (ref 30.0–100.0)

## 2023-03-30 ENCOUNTER — Other Ambulatory Visit (HOSPITAL_COMMUNITY): Payer: Self-pay

## 2023-04-03 ENCOUNTER — Telehealth (INDEPENDENT_AMBULATORY_CARE_PROVIDER_SITE_OTHER): Payer: Self-pay | Admitting: Family Medicine

## 2023-04-03 NOTE — Telephone Encounter (Signed)
Pt needs Prior Authorization for [employer]. Please call pt at number on file. [facility].

## 2023-04-03 NOTE — Telephone Encounter (Signed)
Prior authorization done via cover my meds. Kathryn Huff (Key: Chesley Mires) Wegovy 0.25MG /0.5Mds.

## 2023-04-04 NOTE — Telephone Encounter (Signed)
Patients prior authorization for [facility] was denied. Patient needed to try  phentermine, Qsymia or its individual generic components, and Contrave or its individual generic components. Patient and Dr. Cathey Endow notified.

## 2023-04-25 ENCOUNTER — Ambulatory Visit (INDEPENDENT_AMBULATORY_CARE_PROVIDER_SITE_OTHER): Admitting: Family Medicine

## 2023-04-25 ENCOUNTER — Encounter (INDEPENDENT_AMBULATORY_CARE_PROVIDER_SITE_OTHER): Payer: Self-pay | Admitting: Family Medicine

## 2023-04-25 VITALS — BP 109/74 | HR 64 | Temp 98.7°F | Ht 70.0 in | Wt 309.0 lb

## 2023-04-25 DIAGNOSIS — Z6841 Body Mass Index (BMI) 40.0 and over, adult: Secondary | ICD-10-CM

## 2023-04-25 DIAGNOSIS — E559 Vitamin D deficiency, unspecified: Secondary | ICD-10-CM | POA: Diagnosis not present

## 2023-04-25 DIAGNOSIS — R632 Polyphagia: Secondary | ICD-10-CM | POA: Diagnosis not present

## 2023-04-25 MED ORDER — LOMAIRA 8 MG PO TABS
ORAL_TABLET | ORAL | 0 refills | Status: DC
Start: 2023-04-25 — End: 2023-05-31

## 2023-04-25 MED ORDER — VITAMIN D (ERGOCALCIFEROL) 1.25 MG (50000 UNIT) PO CAPS
50000.0000 [IU] | ORAL_CAPSULE | ORAL | 0 refills | Status: DC
Start: 2023-04-25 — End: 2023-05-31

## 2023-04-25 NOTE — Assessment & Plan Note (Signed)
Worsening.  She is struggling to stay on her eating plan due to increased hunger causing excess snacks especially at work and when getting off work.  She has been working on meal planning, eating on a schedule and getting more lean protein and fiber with her meals.  Her motivation is still strong.  She has added in some workouts from home.  She is a good candidate for use of Lomaira 8 mg tab twice daily for appetite control.  We discussed potential adverse side effects.  Her blood pressure and heart rate are within normal limits.  She denies a history of cardiovascular disease.  Informed consent has been signed.  PDMP has been reviewed.

## 2023-04-25 NOTE — Progress Notes (Signed)
Office: [phone]  /  Fax: [phone]  WEIGHT SUMMARY AND BIOMETRICS  Starting Date: 07/27/22  Starting Weight: 310lb   Weight Lost Since Last Visit: 1lb   Vitals Temp: 98.7 F (37.1 C) BP: 109/74 Pulse Rate: 64 SpO2: 99 %   Body Composition  Body Fat %: 46.6 % Fat Mass (lbs): 144.2 lbs Muscle Mass (lbs): 157 lbs Total Body Water (lbs): 99.6 lbs Visceral Fat Rating : 13     HPI  Chief Complaint: OBESITY  Kathryn Huff is here to discuss her progress with her obesity treatment plan. She is on the the Category 4 Plan and states she is following her eating plan approximately 75 % of the time. She states she is exercising 10 minutes 5 times per week.   Interval History:  Since last office visit she is down 1 lb Her net weight loss is 1 pound in the past 8 months She is prone to over snacking at work and after work She has cut gluten out and abdominal bloating has improved She started [employer] workouts at home 10 min 5 x a week She is going to the beach for 6 day She is feeling hungrier lately She is doing meal planning  Pharmacotherapy: none   PHYSICAL EXAM:  Blood pressure 109/74, pulse 64, temperature 98.7 F (37.1 C), height 5\' 10"  (1.778 m), weight (!) 309 lb (140.2 kg), SpO2 99 %. Body mass index is 44.34 kg/m.  General: She is overweight, cooperative, alert, well developed, and in no acute distress. PSYCH: Has normal mood, affect and thought process.   Lungs: Normal breathing effort, no conversational dyspnea.   ASSESSMENT AND PLAN  TREATMENT PLAN FOR OBESITY:  Recommended Dietary Goals  [name] is currently in the action stage of change. As such, her goal is to continue weight management plan. She has agreed to the Category 4 Plan.  Behavioral Intervention  We discussed the following Behavioral Modification Strategies today: increasing lean protein intake, decreasing simple carbohydrates , increasing vegetables, increasing lower glycemic  fruits, increasing water intake, continue to work on implementation of reduced calorie nutritional plan, continue to practice mindfulness when eating, and planning for success.  Additional resources provided today: NA  Recommended Physical Activity Goals  [name] has been advised to work up to 150 minutes of moderate intensity aerobic activity a week and strengthening exercises 2-3 times per week for cardiovascular health, weight loss maintenance and preservation of muscle mass.   She has agreed to Think about ways to increase physical activity  Pharmacotherapy changes for the treatment of obesity: Lomaira 8 mg twice daily  ASSOCIATED CONDITIONS ADDRESSED TODAY  Vitamin D deficiency Assessment & Plan: Last vitamin D Lab Results  Component Value Date   VD25OH 19.4 (L) 03/28/2023  She is doing well on prescription vitamin D 50,000 IU once weekly.  She denies adverse side effects.  Her energy level is starting to improve.  We are aiming for target vitamin D level 50-70 to help with leptin resistance, energy levels, bone health and immune function.  Continue prescription vitamin D 50,000 IU once weekly.  Plan to recheck level in the next 2 to 3 months.  Orders: -     Vitamin D (Ergocalciferol); Take 1 capsule (50,000 Units total) by mouth every 7 (seven) days.  Dispense: 5 capsule; Refill: 0  Polyphagia Assessment & Plan: Worsening.  She is struggling to stay on her eating plan due to increased hunger causing excess snacks especially at work and when getting off work.  She has been working on meal planning, eating on a schedule and getting more lean protein and fiber with her meals.  Her motivation is still strong.  She has added in some workouts from home.  She is a good candidate for use of Lomaira 8 mg tab twice daily for appetite control.  We discussed potential adverse side effects.  Her blood pressure and heart rate are within normal limits.  She denies a history of cardiovascular  disease.  Informed consent has been signed.  PDMP has been reviewed.  Orders: -     Lomaira; 1 tab po 30 min before breakfast and 1 tab po at 3 pm daily  Dispense: 60 tablet; Refill: 0  Morbid obesity (HCC) with starting BMI 44  BMI 40.0-44.9, adult (HCC)      She was informed of the importance of frequent follow up visits to maximize her success with intensive lifestyle modifications for her multiple health conditions.   ATTESTASTION STATEMENTS:  Reviewed by clinician on day of visit: allergies, medications, problem list, medical history, surgical history, family history, social history, and previous encounter notes pertinent to obesity diagnosis.   I have personally spent 30 minutes total time today in preparation, patient care, nutritional counseling and documentation for this visit, including the following: review of clinical lab tests; review of medical tests/procedures/services.      Glennis Brink, DO DABFM, DABOM [facility] [address], [place] [address] [phone]

## 2023-04-25 NOTE — Assessment & Plan Note (Signed)
Last vitamin D Lab Results  Component Value Date   VD25OH 19.4 (L) 03/28/2023  She is doing well on prescription vitamin D 50,000 IU once weekly.  She denies adverse side effects.  Her energy level is starting to improve.  We are aiming for target vitamin D level 50-70 to help with leptin resistance, energy levels, bone health and immune function.  Continue prescription vitamin D 50,000 IU once weekly.  Plan to recheck level in the next 2 to 3 months.

## 2023-05-12 ENCOUNTER — Encounter: Admitting: Family

## 2023-05-23 ENCOUNTER — Telehealth (INDEPENDENT_AMBULATORY_CARE_PROVIDER_SITE_OTHER): Admitting: Family Medicine

## 2023-05-23 NOTE — Progress Notes (Deleted)
  TeleHealth Visit:  This visit was completed with telemedicine (audio/video) technology. Kathryn Huff has verbally consented to this TeleHealth visit. The patient is located at home, the provider is located at home. The participants in this visit include the listed provider and patient. The visit was conducted today via MyChart video.  OBESITY Kathryn Huff is here to discuss her progress with her obesity treatment plan along with follow-up of her obesity related diagnoses.   Today's visit was # 11 Starting weight: 310 lbs Starting date: 07/27/22 Weight at last in office visit: 309 lbs on 04/25/23 Total weight loss: 1 lbs at last in office visit on 04/25/23. Today's reported weight (05/23/23): {dwwweightreported:29243}  tarting Date: 07/27/22   Starting Weight: 310lb  Nutrition Plan: the Category 4 plan - ***% adherence.  Current exercise: {exercise types:16438} exercising 10 minutes 5 times per week   Interim History:  ***  Eating all of the prescribed protein: {yes***/no:17258} Skipping meals: {dwwyes:29172} Drinking adequate water: {dwwyes:29172} Drinking sugar sweetened beverages: {dwwyes:29172} Hunger controlled: {EWCONTROLASSESSMENT:24261}. Cravings controlled:  {EWCONTROLASSESSMENT:24261}.  Journaling Consistently:  {dwwyes:29172} Meeting protein goals:  {dwwyes:29172} Meeting calorie goals:  {dwwyes:29172}   Pharmacotherapy: Kathryn Huff is on {dwwpharmacotherapy:29109} Adverse side effects: {dwwse:29122} Hunger is {EWCONTROLASSESSMENT:24261}.  Cravings are {EWCONTROLASSESSMENT:24261}.  Assessment/Plan:  1. ***  2. ***  3. ***  {dwwmorbid:29108::"Morbid Obesity"}: Current BMI ***  Pharmacotherapy Plan {dwwmed:29123}  {dwwpharmacotherapy:29109}  Kathryn Huff {CHL AMB IS/IS NOT:210130109} currently in the action stage of change. As such, her goal is to {MWMwtloss#1:210800005}.  She has agreed to {dwwsldiets:29085}.  Exercise goals: {MWM EXERCISE RECS:23473}  Behavioral  modification strategies: {dwwslwtlossstrategies:29088}.  Kathryn Huff has agreed to follow-up with our clinic in {NUMBER 1-10:22536} {dwwfutime:29619}  No orders of the defined types were placed in this encounter.   There are no discontinued medications.   No orders of the defined types were placed in this encounter.     Objective:   VITALS: Per patient if applicable, see vitals. GENERAL: Alert and in no acute distress. CARDIOPULMONARY: No increased WOB. Speaking in clear sentences.  PSYCH: Pleasant and cooperative. Speech normal rate and rhythm. Affect is appropriate. Insight and judgement are appropriate. Attention is focused, linear, and appropriate.  NEURO: Oriented as arrived to appointment on time with no prompting.   Attestation Statements:   Reviewed by clinician on day of visit: allergies, medications, problem list, medical history, surgical history, family history, social history, and previous encounter notes.  ***(delete if time-based billing not used) Time spent on visit including the items listed below was *** minutes.  -preparing to see the patient (e.g., review of tests, history, previous notes) -obtaining and/or reviewing separately obtained history -counseling and educating the patient/family/caregiver -documenting clinical information in the electronic or other health record -ordering medications, tests, or procedures -independently interpreting results and communicating results to the patient/ family/caregiver -referring and communicating with other health care professionals  -care coordination   This was prepared with the assistance of Engineer, civil (consulting).  Occasional wrong-word or sound-a-like substitutions may have occurred due to the inherent limitations of voice recognition software.

## 2023-05-31 ENCOUNTER — Encounter (INDEPENDENT_AMBULATORY_CARE_PROVIDER_SITE_OTHER): Payer: Self-pay | Admitting: Family Medicine

## 2023-05-31 ENCOUNTER — Ambulatory Visit (INDEPENDENT_AMBULATORY_CARE_PROVIDER_SITE_OTHER): Admitting: Family Medicine

## 2023-05-31 VITALS — BP 116/77 | HR 62 | Temp 97.5°F | Ht 70.0 in | Wt 304.0 lb

## 2023-05-31 DIAGNOSIS — Z6841 Body Mass Index (BMI) 40.0 and over, adult: Secondary | ICD-10-CM | POA: Diagnosis not present

## 2023-05-31 DIAGNOSIS — E669 Obesity, unspecified: Secondary | ICD-10-CM | POA: Diagnosis not present

## 2023-05-31 DIAGNOSIS — R632 Polyphagia: Secondary | ICD-10-CM | POA: Diagnosis not present

## 2023-05-31 DIAGNOSIS — O99212 Obesity complicating pregnancy, second trimester: Secondary | ICD-10-CM | POA: Insufficient documentation

## 2023-05-31 DIAGNOSIS — E559 Vitamin D deficiency, unspecified: Secondary | ICD-10-CM

## 2023-05-31 MED ORDER — LOMAIRA 8 MG PO TABS
ORAL_TABLET | ORAL | 0 refills | Status: DC
Start: 2023-05-31 — End: 2023-06-20

## 2023-05-31 MED ORDER — VITAMIN D (ERGOCALCIFEROL) 1.25 MG (50000 UNIT) PO CAPS
50000.0000 [IU] | ORAL_CAPSULE | ORAL | 0 refills | Status: DC
Start: 2023-05-31 — End: 2023-06-20

## 2023-06-05 NOTE — Progress Notes (Unsigned)
Chief Complaint:   OBESITY Kathryn Huff is here to discuss her progress with her obesity treatment plan along with follow-up of her obesity related diagnoses. Kathryn Huff is on the Category 4 Plan and states she is following her eating plan approximately 75% of the time. Kathryn Huff states she is walking and doing aerobics for 5-20 minutes 5 times per week.  Today's visit was #: 11 Starting weight: 310 lbs Starting date: 07/27/2022 Today's weight: 304 lbs Today's date: 05/31/2023 Total lbs lost to date: 6 Total lbs lost since last in-office visit: 5  Interim History: Patient has done well with her weight loss.  Her hunger is better controlled.  She is working on meal planning and she is doing well overall.  Subjective:   1. Polyphagia Patient notes an improvement in polyphagia on Lomaira.  She had 1 episode of palpitations but none since.  Her blood pressure is stable.  2. Vitamin D deficiency Patient is stable on vitamin D, but her level is not yet at goal.  She denies nausea or vomiting.  Assessment/Plan:   1. Polyphagia Patient will continue Lomaira, and we will refill for 1 month.  - Phentermine HCl (LOMAIRA) 8 MG TABS; 1 tab po 30 min before breakfast and 1 tab po at 3 pm daily  Dispense: 60 tablet; Refill: 0  2. Vitamin D deficiency Patient will continue prescription vitamin D, and we will refill for 1 month.  - Vitamin D, Ergocalciferol, (DRISDOL) 1.25 MG (50000 UNIT) CAPS capsule; Take 1 capsule (50,000 Units total) by mouth every 7 (seven) days.  Dispense: 5 capsule; Refill: 0  3. BMI 40.0-44.9, adult (HCC)  4. Obesity, Beginning BMI 44 Kathryn Huff is currently in the action stage of change. As such, her goal is to continue with weight loss efforts. She has agreed to the Category 4 Plan.   Exercise goals: As is.   Behavioral modification strategies: increasing lean protein intake and no skipping meals.  Kathryn Huff has agreed to follow-up with our clinic in 3 to 4 weeks. She was  informed of the importance of frequent follow-up visits to maximize her success with intensive lifestyle modifications for her multiple health conditions.   Objective:   Blood pressure 116/77, pulse 62, temperature (!) 97.5 F (36.4 C), height 5\' 10"  (1.778 m), weight (!) 304 lb (137.9 kg), SpO2 98 %. Body mass index is 43.62 kg/m.  Lab Results  Component Value Date   CREATININE 0.73 04/06/2022   BUN 11 04/06/2022   NA 138 04/06/2022   K 4.5 04/06/2022   CL 107 04/06/2022   CO2 26 04/06/2022   Lab Results  Component Value Date   ALT 20 04/06/2022   AST 14 04/06/2022   ALKPHOS 83 04/06/2022   BILITOT 0.4 04/06/2022   Lab Results  Component Value Date   HGBA1C 5.6 07/27/2022   HGBA1C 5.9 04/06/2022   HGBA1C 5.5 09/20/2017   HGBA1C 5.5 06/15/2017   HGBA1C 5.4 03/01/2017   Lab Results  Component Value Date   INSULIN 16.2 07/27/2022   INSULIN 30.4 (H) 09/20/2017   INSULIN 29.7 (H) 06/15/2017   INSULIN 32.5 (H) 03/01/2017   Lab Results  Component Value Date   TSH 1.28 04/06/2022   Lab Results  Component Value Date   CHOL 138 04/06/2022   HDL 51.90 04/06/2022   LDLCALC 59 04/06/2022   TRIG 136.0 04/06/2022   CHOLHDL 3 04/06/2022   Lab Results  Component Value Date   VD25OH 19.4 (L) 03/28/2023  VD25OH 28.6 (L) 07/27/2022   VD25OH 20.55 (L) 04/06/2022   Lab Results  Component Value Date   WBC 8.3 07/27/2022   HGB 13.4 07/27/2022   HCT 40.1 07/27/2022   MCV 86 07/27/2022   PLT 277 07/27/2022   No results found for: "IRON", "TIBC", "FERRITIN"  Attestation Statements:   Reviewed by clinician on day of visit: allergies, medications, problem list, medical history, surgical history, family history, social history, and previous encounter notes.   I, Burt Knack, am acting as transcriptionist for Quillian Quince, MD.  I have reviewed the above documentation for accuracy and completeness, and I agree with the above. -  Quillian Quince, MD

## 2023-06-09 ENCOUNTER — Ambulatory Visit (INDEPENDENT_AMBULATORY_CARE_PROVIDER_SITE_OTHER): Admitting: Family

## 2023-06-09 ENCOUNTER — Encounter: Payer: Self-pay | Admitting: Family

## 2023-06-09 VITALS — BP 106/60 | HR 68 | Temp 97.9°F | Resp 16 | Ht 70.0 in | Wt 304.0 lb

## 2023-06-09 DIAGNOSIS — Z0000 Encounter for general adult medical examination without abnormal findings: Secondary | ICD-10-CM | POA: Diagnosis not present

## 2023-06-09 NOTE — Patient Instructions (Signed)
During your recent visit, we discussed your ongoing weight management, your history of headaches, and your future family planning. You've made significant progress in managing your weight with the help of Phentermine and an active lifestyle. Your headaches have improved since reducing your caffeine intake. You're not currently using any form of birth control and are considering having more children in the future.  YOUR PLAN:  -WEIGHT MANAGEMENT: You're doing well with your weight management, thanks to your medication, Phentermine, and an active lifestyle. Keep up the good work with healthy eating and physical activity. We'll continue with your current medication. Also, considering your plans for future pregnancy, it would be good to start taking prenatal vitamins.  -IMMUNIZATIONS: You're due for your flu and COVID booster shots in the fall. It's important to stay up-to-date with these vaccines to protect your health.  -OB/GYN: You mentioned dissatisfaction with your current OB/GYN. We can consider a referral to Dr. Lowry Ram or return to [facility] based on your preference. Also, considering your plans for future pregnancy, it would be good to start taking prenatal vitamins.  -VITAMIN D DEFICIENCY: Your Vitamin D levels are still a bit low despite supplementation. We'll continue with your current Vitamin D supplementation to help improve this.

## 2023-06-09 NOTE — Progress Notes (Signed)
Subjective:     Patient ID: Kathryn Huff, female    DOB: 07/30/1991, 32 y.o.   MRN: [id number]  Chief Complaint  Patient presents with   Annual Exam    HPI  Discussed the use of AI scribe software for clinical note transcription with the patient, who gave verbal consent to proceed.  History of Present Illness         Patient presents today for complete physical.  The patient, a mother of two young children, presents for a routine check-up. She reports a busy home life with their children, aged two and four. She has been working on [employer] with the assistance of phentermine, which was started in mid-May. The medication has significantly reduced her appetite and late-night binging, to the point where she often has to force herself to eat. She reports that this has made it easier to maintain a healthy diet. With the warmer weather, she has also been more active, as her children are keeping her moving.  The patient has a history of headaches, which have been improving. She recently reduced her caffeine intake, which initially led to an increase in headaches, but these have since subsided. She denies any current concerns about depression or anxiety.  She is currently not using any form of birth control and is tracking her menstrual cycle. She is not actively trying to conceive at this time, but is considering having more children in the future. She expresses a preference for delivering at [facility] if she becomes pregnant again, and is seeking recommendations for a new OB/GYN due to dissatisfaction with her previous experiences.  The patient is up-to-date on her eye exam and had a dental check-up last year. She reports no changes in family medical history, no current cough or cold symptoms, no skin rashes or moles that have changed, and no unusual muscle or joint pain. She denies any current concerns about depression or anxiety.  She is working with healthy weight and wellness.      Immunizations: up to date Diet: on Phentermine. Working with the healthy weight and wellness clinic Exercise: active with kids.  Wt Readings from Last 3 Encounters:  06/09/23 (!) 304 lb (137.9 kg)  05/31/23 (!) 304 lb (137.9 kg)  04/25/23 (!) 309 lb (140.2 kg)  Pap Smear: 2023 Vision: up to date Dental: due- will schedule.    Health Maintenance Due  Topic Date Due   Hepatitis C Screening  Never done   COVID-19 Vaccine (3 - Pfizer risk series) 08/27/2020    Past Medical History:  Diagnosis Date   ADD (attention deficit disorder)    ADHD    Anxiety    Back pain    Celiac disease 12/22/2017   Chest pain    Constipation    Depression    Fatty liver    Heart murmur    Liver function abnormality    HIGH   Migraines    Obesity    Prediabetes    Vitamin D deficiency     Past Surgical History:  Procedure Laterality Date   CESAREAN SECTION     CESAREAN SECTION  03/25/2021   MOUTH SURGERY     two teeth surgically removed.      Family History  Problem Relation Age of Onset   Factor V Leiden deficiency Mother    Obesity Mother    Heart disease Mother    Diabetes Father    Hyperlipidemia Father    Sleep apnea Father  Obesity Father    Diabetes Sister 5       Type 1   Celiac disease Sister    Heart Problems Sister        left ventricular non-compaction   Diabetes Mellitus II Maternal Grandmother    Factor V Leiden deficiency Maternal Grandmother    Diabetes Mellitus II Maternal Grandfather    Cancer Paternal Grandmother        breast   Breast cancer Paternal Grandmother    Cancer Paternal Grandfather        throat/throat and lung, smoker   Esophageal cancer Paternal Grandfather    Colon cancer Neg Hx    Colon polyps Neg Hx    Stomach cancer Neg Hx    Rectal cancer Neg Hx     Social History   Socioeconomic History   Marital status: Married    Spouse name: Apolinar Junes   Number of children: 2   Years of education: Not on file   Highest education  level: Not on file  Occupational History   Occupation: Engineer, structural & Admission Associate    Employer: [employer]  Tobacco Use   Smoking status: Former    Years: 4    Types: Cigarettes   Smokeless tobacco: Never  Vaping Use   Vaping Use: Never used  Substance and Sexual Activity   Alcohol use: Yes    Alcohol/week: 1.0 standard drink of alcohol    Types: 1 Glasses of wine per week    Comment: occasional   Drug use: No   Sexual activity: Yes    Birth control/protection: Other-see comments  Other Topics Concern   Not on file  Social History Narrative   Lives with husband   Has one sister and one brother   Dentist at [facility]   Enjoys spending time with her sister   One dog- huskey/shepherd mix.     2 children (one son and one daughter)   Social Determinants of Health   Financial Resource Strain: Not on file  Food Insecurity: Not on file  Transportation Needs: Not on file  Physical Activity: Inactive (04/06/2022)   Exercise Vital Sign    Days of Exercise per Week: 0 days    Minutes of Exercise per Session: 0 min  Stress: Not on file  Social Connections: Not on file  Intimate Partner Violence: Not on file    Outpatient Medications Prior to Visit  Medication Sig Dispense Refill   acetaminophen (TYLENOL) 500 MG tablet Take 500 mg by mouth every 6 (six) hours as needed for mild pain.     Phentermine HCl (LOMAIRA) 8 MG TABS 1 tab po 30 min before breakfast and 1 tab po at 3 pm daily 60 tablet 0   Vitamin D, Ergocalciferol, (DRISDOL) 1.25 MG (50000 UNIT) CAPS capsule Take 1 capsule (50,000 Units total) by mouth every 7 (seven) days. 5 capsule 0   No facility-administered medications prior to visit.    Allergies  Allergen Reactions   Gluten Meal Other (See Comments) and Diarrhea    Review of Systems  Constitutional:  Positive for weight loss.  HENT:  Negative for congestion and hearing loss.   Eyes:  Negative for blurred vision.  Respiratory:   Negative for cough.   Cardiovascular:  Negative for leg swelling.  Genitourinary:  Negative for dysuria and frequency.  Musculoskeletal:  Negative for joint pain and myalgias.  Skin:  Negative for rash.  Neurological:  Negative for headaches.  Psychiatric/Behavioral:  Denies depression/anxiety       Objective:    Physical Exam   BP 106/60 (BP Location: Right Arm, Patient Position: Sitting, Cuff Size: Large)   Pulse 68   Temp 97.9 F (36.6 C) (Oral)   Resp 16   Ht 5\' 10"  (1.778 m)   Wt (!) 304 lb (137.9 kg)   SpO2 100%   BMI 43.62 kg/m  Wt Readings from Last 3 Encounters:  06/09/23 (!) 304 lb (137.9 kg)  05/31/23 (!) 304 lb (137.9 kg)  04/25/23 (!) 309 lb (140.2 kg)  Physical Exam  Constitutional: She is oriented to person, place, and time. She appears well-developed and well-nourished. No distress.  HENT:  Head: Normocephalic and atraumatic.  Right Ear: Tympanic membrane and ear canal normal.  Left Ear: Tympanic membrane and ear canal normal.  Mouth/Throat: Oropharynx is clear and moist.  Eyes: Pupils are equal, round, and reactive to light. No scleral icterus.  Neck: Normal range of motion. No thyromegaly present.  Cardiovascular: Normal rate and regular rhythm.   No murmur heard. Pulmonary/Chest: Effort normal and breath sounds normal. No respiratory distress. He has no wheezes. She has no rales. She exhibits no tenderness.  Abdominal: Soft. Bowel sounds are normal. She exhibits no distension and no mass. There is no tenderness. There is no rebound and no guarding.  Musculoskeletal: She exhibits no edema.  Lymphadenopathy:    She has no cervical adenopathy.  Neurological: She is alert and oriented to person, place, and time. She has normal patellar reflexes. She exhibits normal muscle tone. Coordination normal.  Skin: Skin is warm and dry.  Psychiatric: She has a normal mood and affect. Her behavior is normal. Judgment and thought content  normal Breast/Pelvic: deferred          Assessment & Plan:        Assessment & Plan:   Problem List Items Addressed This Visit       Unprioritized   Routine general medical examination at a health care facility - Primary    Discussed diet/exercise and weight loss.  She will continue her paps with GYN.  Recommended flu and covid shots in the fall.  She will schedule dental exam.        I am having Josefita M. Fiske maintain her acetaminophen, Lomaira, and Vitamin D (Ergocalciferol).  No orders of the defined types were placed in this encounter.

## 2023-06-09 NOTE — Assessment & Plan Note (Signed)
Discussed diet/exercise and weight loss.  She will continue her paps with GYN.  Recommended flu and covid shots in the fall.  She will schedule dental exam.

## 2023-06-20 ENCOUNTER — Ambulatory Visit (INDEPENDENT_AMBULATORY_CARE_PROVIDER_SITE_OTHER): Admitting: Family Medicine

## 2023-06-20 ENCOUNTER — Encounter (INDEPENDENT_AMBULATORY_CARE_PROVIDER_SITE_OTHER): Payer: Self-pay | Admitting: Family Medicine

## 2023-06-20 VITALS — BP 116/79 | HR 69 | Temp 98.7°F | Ht 70.0 in | Wt 299.0 lb

## 2023-06-20 DIAGNOSIS — E559 Vitamin D deficiency, unspecified: Secondary | ICD-10-CM

## 2023-06-20 DIAGNOSIS — Z6841 Body Mass Index (BMI) 40.0 and over, adult: Secondary | ICD-10-CM

## 2023-06-20 DIAGNOSIS — R632 Polyphagia: Secondary | ICD-10-CM

## 2023-06-20 DIAGNOSIS — E669 Obesity, unspecified: Secondary | ICD-10-CM

## 2023-06-20 MED ORDER — LOMAIRA 8 MG PO TABS
ORAL_TABLET | ORAL | 0 refills | Status: DC
Start: 2023-06-20 — End: 2023-07-12

## 2023-06-20 MED ORDER — VITAMIN D (ERGOCALCIFEROL) 1.25 MG (50000 UNIT) PO CAPS
50000.0000 [IU] | ORAL_CAPSULE | ORAL | 0 refills | Status: DC
Start: 2023-06-20 — End: 2023-07-12

## 2023-06-20 NOTE — Progress Notes (Signed)
Office: [phone]  /  Fax: [phone]  WEIGHT SUMMARY AND BIOMETRICS  Starting Date: 07/27/22  Starting Weight: 310lb   Weight Lost Since Last Visit: 5lb   Vitals Temp: 98.7 F (37.1 C) BP: 116/79 Pulse Rate: 69 SpO2: 99 %   Body Composition  Body Fat %: 43.6 % Fat Mass (lbs): 130.6 lbs Muscle Mass (lbs): 160.4 lbs Total Body Water (lbs): 101.4 lbs Visceral Fat Rating : 12     HPI  Chief Complaint: OBESITY  Kathryn Huff is here to discuss her progress with her obesity treatment plan. She is on the the Category 4 Plan and states she is following her eating plan approximately 80 % of the time. She states she is exercising 30 minutes 5 times per week.   Interval History:  Since last office visit she is down 5 lb She celebrated her son's 4th birthday but she did not indulge in cake She is liking the [employer] pasta She is down a net 11 lb in the past 11 mos She has lost 10 lb in 2 mos since adding Lomaira  She has been taking Lomaira 8 mg bid --has just dry mouth but no other SE She likes the improved satiety Uses Miralax prn constipation She is playing at the park with her kids and has a pool at home She is up 0.8 lb of muscle mass and is down 5.2 lb of body fat in the past 3 weeks She is not longer getting fast food on her way home from work  Pharmacotherapy: Lomaira 8 mg bid  PHYSICAL EXAM:  Blood pressure 116/79, pulse 69, temperature 98.7 F (37.1 C), height 5\' 10"  (1.778 m), weight 299 lb (135.6 kg), SpO2 99 %. Body mass index is 42.9 kg/m.  General: She is overweight, cooperative, alert, well developed, and in no acute distress. PSYCH: Has normal mood, affect and thought process.   Lungs: Normal breathing effort, no conversational dyspnea.   ASSESSMENT AND PLAN  TREATMENT PLAN FOR OBESITY:  Recommended Dietary Goals  Tanaysia is currently in the action stage of change. As such, her goal is to continue weight management plan. She has agreed  to the Category 4 Plan.  Behavioral Intervention  We discussed the following Behavioral Modification Strategies today: increasing lean protein intake, decreasing simple carbohydrates , increasing vegetables, increasing lower glycemic fruits, increasing fiber rich foods, avoiding skipping meals, increasing water intake, keeping healthy foods at home, work on managing stress, creating time for self-care and relaxation measures, continue to practice mindfulness when eating, and planning for success.  Additional resources provided today: NA  Recommended Physical Activity Goals  Zelia has been advised to work up to 150 minutes of moderate intensity aerobic activity a week and strengthening exercises 2-3 times per week for cardiovascular health, weight loss maintenance and preservation of muscle mass.   She has agreed to Think about ways to increase daily physical activity and overcoming barriers to exercise  Pharmacotherapy changes for the treatment of obesity: none  ASSOCIATED CONDITIONS ADDRESSED TODAY  Polyphagia Assessment & Plan: Improving on Lomaira 8 mg bid for appetite control. Has had dry mouth, using Biotene but had only one episode of fast HR at work after little water intake and too much caffeine. She has been off Adderall for a new years, previously on for ADHD. Doing well with getting in lean protein and fiber with meals and avoids meal skipping.  She has lost 10 lb in 2 mos since starting Lomaira 8 mg bid.  BP/ HR are WNL. She is not actively trying to conceive.  Continue Lomaira 8 mg bid + lean protein and fiber with meals and snacks. Increase water intake to 100 oz/ day   Orders: -     Lomaira; 1 tab po 30 min before breakfast and 1 tab po at 3 pm daily  Dispense: 60 tablet; Refill: 0  Vitamin D deficiency Assessment & Plan: Last vitamin D Lab Results  Component Value Date   VD25OH 19.4 (L) 03/28/2023   Doing well on RX vitamin D 50,000 international units   weekly Denies adverse SE Energy level is improving  Recheck Vitamin D level next visit  Orders: -     Vitamin D (Ergocalciferol); Take 1 capsule (50,000 Units total) by mouth every 7 (seven) days.  Dispense: 5 capsule; Refill: 0  Obesity, Beginning BMI 44  BMI 40.0-44.9, adult (HCC)      She was informed of the importance of frequent follow up visits to maximize her success with intensive lifestyle modifications for her multiple health conditions.   ATTESTASTION STATEMENTS:  Reviewed by clinician on day of visit: allergies, medications, problem list, medical history, surgical history, family history, social history, and previous encounter notes pertinent to obesity diagnosis.   I have personally spent 30 minutes total time today in preparation, patient care, nutritional counseling and documentation for this visit, including the following: review of clinical lab tests; review of medical tests/procedures/services.      Glennis Brink, DO DABFM, DABOM [facility] [address] [place], [place] [address] [phone]

## 2023-06-20 NOTE — Assessment & Plan Note (Signed)
Last vitamin D Lab Results  Component Value Date   VD25OH 19.4 (L) 03/28/2023   Doing well on RX vitamin D 50,000 international units  weekly Denies adverse SE Energy level is improving  Recheck Vitamin D level next visit

## 2023-06-20 NOTE — Assessment & Plan Note (Signed)
Improving on Lomaira 8 mg bid for appetite control. Has had dry mouth, using Biotene but had only one episode of fast HR at work after little water intake and too much caffeine. She has been off Adderall for a new years, previously on for ADHD. Doing well with getting in lean protein and fiber with meals and avoids meal skipping.  She has lost 10 lb in 2 mos since starting Lomaira 8 mg bid.  BP/ HR are WNL. She is not actively trying to conceive.  Continue Lomaira 8 mg bid + lean protein and fiber with meals and snacks. Increase water intake to 100 oz/ day

## 2023-07-05 ENCOUNTER — Other Ambulatory Visit (INDEPENDENT_AMBULATORY_CARE_PROVIDER_SITE_OTHER): Payer: Self-pay | Admitting: Family Medicine

## 2023-07-05 DIAGNOSIS — E559 Vitamin D deficiency, unspecified: Secondary | ICD-10-CM

## 2023-07-12 ENCOUNTER — Encounter (INDEPENDENT_AMBULATORY_CARE_PROVIDER_SITE_OTHER): Payer: Self-pay | Admitting: Family Medicine

## 2023-07-12 ENCOUNTER — Ambulatory Visit (INDEPENDENT_AMBULATORY_CARE_PROVIDER_SITE_OTHER): Admitting: Family Medicine

## 2023-07-12 VITALS — BP 128/83 | HR 82 | Temp 98.1°F | Ht 70.0 in | Wt 297.0 lb

## 2023-07-12 DIAGNOSIS — E559 Vitamin D deficiency, unspecified: Secondary | ICD-10-CM | POA: Diagnosis not present

## 2023-07-12 DIAGNOSIS — Z6841 Body Mass Index (BMI) 40.0 and over, adult: Secondary | ICD-10-CM | POA: Diagnosis not present

## 2023-07-12 DIAGNOSIS — R632 Polyphagia: Secondary | ICD-10-CM | POA: Diagnosis not present

## 2023-07-12 DIAGNOSIS — E669 Obesity, unspecified: Secondary | ICD-10-CM

## 2023-07-12 MED ORDER — VITAMIN D (ERGOCALCIFEROL) 1.25 MG (50000 UNIT) PO CAPS
50000.0000 [IU] | ORAL_CAPSULE | ORAL | 0 refills | Status: DC
Start: 2023-07-12 — End: 2023-12-12

## 2023-07-12 MED ORDER — LOMAIRA 8 MG PO TABS
ORAL_TABLET | ORAL | 0 refills | Status: DC
Start: 2023-07-12 — End: 2023-08-14

## 2023-07-12 NOTE — Assessment & Plan Note (Signed)
Improving on Lomaira 8 mg tab bid + a higher protein diet.  Still working to prevent meal skipping, carrying healthy foods to work and supplementing with a protein shake.  Has lost 12 lb since starting Lomaira 2 mos ago.  BP and HR are WNL and she denies adverse Ses.    Continue Lomaira 8 mg bid and aim for 110 + g of protein intake daily

## 2023-07-12 NOTE — Assessment & Plan Note (Signed)
Last vitamin D Lab Results  Component Value Date   VD25OH 19.4 (L) 03/28/2023   Doing well on RX vitamin D weekly Denies adverse SE Energy level starting to improve  Recheck vitamin D level next month

## 2023-07-12 NOTE — Progress Notes (Signed)
Office: [phone]  /  Fax: [phone]  WEIGHT SUMMARY AND BIOMETRICS  Starting Date: 07/27/22  Starting Weight: 310lb   Weight Lost Since Last Visit: 2lb   Vitals Temp: 98.1 F (36.7 C) BP: 128/83 Pulse Rate: 82 SpO2: 100 %   Body Composition  Body Fat %: 44.8 % Fat Mass (lbs): 133.4 lbs Muscle Mass (lbs): 156 lbs Total Body Water (lbs): 100.6 lbs Visceral Fat Rating : 12     HPI  Chief Complaint: OBESITY  Kathryn Huff is here to discuss her progress with her obesity treatment plan. She is on the the Category 4 Plan and states she is following her eating plan approximately 60 % of the time. She states she is exercising 30 minutes 5 times per week.   Interval History:  Since last office visit she is down 2 lb She has been on Lomaira 2 x a day with improved appetite control She sometimes forgets her afternoon dose She is trying to get in all of her meals- supplementing with a protein shake She still has some head hunger at night She is doing better getting in her snacks and adding sugar free drinks She hasn't been getting outside as much for exercise She has a beach trip planned  Pharmacotherapy: Lomaira 8 mg bid   PHYSICAL EXAM:  Blood pressure 128/83, pulse 82, temperature 98.1 F (36.7 C), height 5\' 10"  (1.778 m), weight 297 lb (134.7 kg), SpO2 100%. Body mass index is 42.62 kg/m.  General: She is overweight, cooperative, alert, well developed, and in no acute distress. PSYCH: Has normal mood, affect and thought process.   Lungs: Normal breathing effort, no conversational dyspnea.   ASSESSMENT AND PLAN  TREATMENT PLAN FOR OBESITY:  Recommended Dietary Goals  Kathryn Huff is currently in the action stage of change. As such, her goal is to continue weight management plan. She has agreed to the Category 4 Plan.  Behavioral Intervention  We discussed the following Behavioral Modification Strategies today: increasing lean protein intake, decreasing  simple carbohydrates , increasing vegetables, increasing lower glycemic fruits, increasing fiber rich foods, avoiding skipping meals, increasing water intake, work on meal planning and preparation, avoiding temptations and identifying enticing environmental cues, continue to practice mindfulness when eating, and planning for success.  Additional resources provided today: NA  Recommended Physical Activity Goals  Kathryn Huff has been advised to work up to 150 minutes of moderate intensity aerobic activity a week and strengthening exercises 2-3 times per week for cardiovascular health, weight loss maintenance and preservation of muscle mass.   She has agreed to Increase the intensity, frequency or duration of aerobic exercises    Pharmacotherapy changes for the treatment of obesity: Lomaira 8 mg bid   ASSOCIATED CONDITIONS ADDRESSED TODAY  Polyphagia Assessment & Plan: Improving on Lomaira 8 mg tab bid + a higher protein diet.  Still working to prevent meal skipping, carrying healthy foods to work and supplementing with a protein shake.  Has lost 12 lb since starting Lomaira 2 mos ago.  BP and HR are WNL and she denies adverse Ses.    Continue Lomaira 8 mg bid and aim for 110 + g of protein intake daily   Orders: -     Lomaira; 1 tab po 30 min before breakfast and 1 tab po at 3 pm daily  Dispense: 60 tablet; Refill: 0  Vitamin D deficiency Assessment & Plan: Last vitamin D Lab Results  Component Value Date   VD25OH 19.4 (L) 03/28/2023   Doing well  on RX vitamin D weekly Denies adverse SE Energy level starting to improve  Recheck vitamin D level next month  Orders: -     Vitamin D (Ergocalciferol); Take 1 capsule (50,000 Units total) by mouth every 7 (seven) days.  Dispense: 5 capsule; Refill: 0  Obesity, Beginning BMI 44  BMI 40.0-44.9, adult (HCC)      She was informed of the importance of frequent follow up visits to maximize her success with intensive lifestyle modifications  for her multiple health conditions.   ATTESTASTION STATEMENTS:  Reviewed by clinician on day of visit: allergies, medications, problem list, medical history, surgical history, family history, social history, and previous encounter notes pertinent to obesity diagnosis.   I have personally spent 30 minutes total time today in preparation, patient care, nutritional counseling and documentation for this visit, including the following: review of clinical lab tests; review of medical tests/procedures/services.      Glennis Brink, DO DABFM, DABOM [facility] [address] [place], [place] [address] [phone]

## 2023-07-26 ENCOUNTER — Telehealth (INDEPENDENT_AMBULATORY_CARE_PROVIDER_SITE_OTHER): Payer: Self-pay | Admitting: Family Medicine

## 2023-07-26 NOTE — Telephone Encounter (Signed)
On 8/7, a call was made to the patient, and a voicemail was left requesting a return call to schedule an appointment with Dr. Cathey Endow.Patient needs 4-5wk fu

## 2023-08-09 ENCOUNTER — Ambulatory Visit (INDEPENDENT_AMBULATORY_CARE_PROVIDER_SITE_OTHER): Admitting: Family Medicine

## 2023-08-14 ENCOUNTER — Ambulatory Visit: Admitting: Family Medicine

## 2023-08-14 ENCOUNTER — Encounter: Payer: Self-pay | Admitting: Family Medicine

## 2023-08-14 VITALS — BP 119/83 | HR 70 | Temp 97.5°F | Ht 70.0 in | Wt 294.0 lb

## 2023-08-14 DIAGNOSIS — R632 Polyphagia: Secondary | ICD-10-CM | POA: Diagnosis not present

## 2023-08-14 DIAGNOSIS — E669 Obesity, unspecified: Secondary | ICD-10-CM | POA: Diagnosis not present

## 2023-08-14 DIAGNOSIS — E88819 Insulin resistance, unspecified: Secondary | ICD-10-CM | POA: Diagnosis not present

## 2023-08-14 DIAGNOSIS — E559 Vitamin D deficiency, unspecified: Secondary | ICD-10-CM | POA: Diagnosis not present

## 2023-08-14 DIAGNOSIS — Z6841 Body Mass Index (BMI) 40.0 and over, adult: Secondary | ICD-10-CM

## 2023-08-14 MED ORDER — LOMAIRA 8 MG PO TABS
ORAL_TABLET | ORAL | 0 refills | Status: DC
Start: 2023-08-14 — End: 2023-09-11

## 2023-08-14 NOTE — Assessment & Plan Note (Signed)
Improving on Lomaira 8 mg tab twice daily.  She has a means of birth control and her heart rate and blood pressure are within normal limits.  She is averaging over 2 pounds of weight loss each month while on Lomaira.  Continue Lomaira 8 mg tab twice daily, #60, 0 refills

## 2023-08-14 NOTE — Progress Notes (Signed)
Office: [phone]  /  Fax: [phone]  WEIGHT SUMMARY AND BIOMETRICS  Starting Date: 07/27/22  Starting Weight: 310lb   Weight Lost Since Last Visit: 3lb   Vitals Temp: (!) 97.5 F (36.4 C) BP: 119/83 Pulse Rate: 70 SpO2: 99 %   Body Composition  Body Fat %: 43.8 % Fat Mass (lbs): 129 lbs Muscle Mass (lbs): 157.2 lbs Total Body Water (lbs): 101.4 lbs Visceral Fat Rating : 11   HPI  Chief Complaint: OBESITY  Kathryn Huff is here to discuss her progress with her obesity treatment plan. She is on the the Category 4 Plan and states she is following her eating plan approximately 50 % of the time. She states she is exercising 30 minutes 3-4 times per week.   Interval History:  Since last office visit she is down 3 lb in the past month She went to the beach and walked a lot since last visit She stayed mindful of water and protein intake She indulged a little in alcohol while on vacation She has a good support system at home She has been taking Lomaira 8 mg bid, started 5/7 at 309 lb She has some menstrual food cravings, satisfied by one chocolate square She did gain 1.2 lb of muscle mass and is down 4.4 lb of body fat since last visit She has been playing outside with her kids  Pharmacotherapy: Lomaira 8 mg bid  PHYSICAL EXAM:  Blood pressure 119/83, pulse 70, temperature (!) 97.5 F (36.4 C), height 5\' 10"  (1.778 m), weight 294 lb (133.4 kg), SpO2 99%. Body mass index is 42.18 kg/m.  General: She is overweight, cooperative, alert, well developed, and in no acute distress. PSYCH: Has normal mood, affect and thought process.   Lungs: Normal breathing effort, no conversational dyspnea.   ASSESSMENT AND PLAN  TREATMENT PLAN FOR OBESITY:  Recommended Dietary Goals  Kathryn Huff is currently in the action stage of change. As such, her goal is to continue weight management plan. She has agreed to the Category 4 Plan.  Behavioral Intervention  We discussed the  following Behavioral Modification Strategies today: increasing lean protein intake, decreasing simple carbohydrates , increasing vegetables, increasing lower glycemic fruits, increasing water intake, work on meal planning and preparation, keeping healthy foods at home, continue to practice mindfulness when eating, planning for success, and better snacking choices.  Additional resources provided today: NA  Recommended Physical Activity Goals  Kathryn Huff has been advised to work up to 150 minutes of moderate intensity aerobic activity a week and strengthening exercises 2-3 times per week for cardiovascular health, weight loss maintenance and preservation of muscle mass.   She has agreed to Start aerobic activity with a goal of 150 minutes a week at moderate intensity.   Pharmacotherapy changes for the treatment of obesity: no changes  ASSOCIATED CONDITIONS ADDRESSED TODAY  Insulin resistance Assessment & Plan: Improving.  Fasting insulin has improved from 30-16.  She has lost 16 pounds in the past 1 year of medically supervised weight management.  She has reduced her intake of starches and sweets.  She has increased her walking time.  Plan to recheck fasting insulin with labs next visit.   Vitamin D deficiency Assessment & Plan: Last vitamin D Lab Results  Component Value Date   VD25OH 19.4 (L) 03/28/2023  Doing well on vitamin D 50,000 IU once weekly.  Unable to obtain labs today.  Energy level is improving.  Plan to recheck vitamin D level next visit.  Once she has completed  her current prescription for vitamin D 50,000 IU once weekly, she may switch to over-the-counter vitamin D 2000 IU once daily for maintenance.    Polyphagia Assessment & Plan: Improving on Lomaira 8 mg tab twice daily.  She has a means of birth control and her heart rate and blood pressure are within normal limits.  She is averaging over 2 pounds of weight loss each month while on Lomaira.  Continue Lomaira 8 mg tab  twice daily, #60, 0 refills    Orders: -     Lomaira; 1 tab po 30 min before breakfast and 1 tab po at 3 pm daily  Dispense: 60 tablet; Refill: 0  Obesity, Beginning BMI 44  BMI 40.0-44.9, adult (HCC)      She was informed of the importance of frequent follow up visits to maximize her success with intensive lifestyle modifications for her multiple health conditions.   ATTESTASTION STATEMENTS:  Reviewed by clinician on day of visit: allergies, medications, problem list, medical history, surgical history, family history, social history, and previous encounter notes pertinent to obesity diagnosis.   I have personally spent 30 minutes total time today in preparation, patient care, nutritional counseling and documentation for this visit, including the following: review of clinical lab tests; review of medical tests/procedures/services.      Glennis Brink, DO DABFM, DABOM [facility] [address] [place], [place] [address] [phone]

## 2023-08-14 NOTE — Assessment & Plan Note (Signed)
Improving.  Fasting insulin has improved from 30-16.  She has lost 16 pounds in the past 1 year of medically supervised weight management.  She has reduced her intake of starches and sweets.  She has increased her walking time.  Plan to recheck fasting insulin with labs next visit.

## 2023-08-14 NOTE — Assessment & Plan Note (Signed)
Last vitamin D Lab Results  Component Value Date   VD25OH 19.4 (L) 03/28/2023  Doing well on vitamin D 50,000 IU once weekly.  Unable to obtain labs today.  Energy level is improving.  Plan to recheck vitamin D level next visit.  Once she has completed her current prescription for vitamin D 50,000 IU once weekly, she may switch to over-the-counter vitamin D 2000 IU once daily for maintenance.

## 2023-09-11 ENCOUNTER — Ambulatory Visit: Admitting: Family Medicine

## 2023-09-11 ENCOUNTER — Encounter: Payer: Self-pay | Admitting: Family Medicine

## 2023-09-11 VITALS — BP 121/85 | HR 66 | Temp 98.0°F | Ht 70.0 in | Wt 297.0 lb

## 2023-09-11 DIAGNOSIS — R7303 Prediabetes: Secondary | ICD-10-CM

## 2023-09-11 DIAGNOSIS — E559 Vitamin D deficiency, unspecified: Secondary | ICD-10-CM

## 2023-09-11 DIAGNOSIS — Z6841 Body Mass Index (BMI) 40.0 and over, adult: Secondary | ICD-10-CM

## 2023-09-11 DIAGNOSIS — R632 Polyphagia: Secondary | ICD-10-CM | POA: Diagnosis not present

## 2023-09-11 MED ORDER — LOMAIRA 8 MG PO TABS
ORAL_TABLET | ORAL | 0 refills | Status: DC
Start: 2023-09-11 — End: 2023-12-12

## 2023-09-11 NOTE — Assessment & Plan Note (Signed)
Improving with use of Lomaira 8 mg tab twice daily.  She has had some associated dry mouth but no other adverse side effects.  She has seen muscle gain and body fat loss since her last visit.  She feels improved satiety and had several days without taking Lomaira and noticed increased appetite.  Blood pressure and heart rate are under good control and she is not at risk for pregnancy.  Continue Lomaira 8 mg tab twice daily as directed.  Increase water intake.  Ensure adequate intake of nonstarchy vegetables, 1-2 servings of fruit daily and then daily protein target around 140 g/day

## 2023-09-11 NOTE — Assessment & Plan Note (Signed)
Last vitamin D Lab Results  Component Value Date   VD25OH 19.4 (L) 03/28/2023   She has been taking vitamin D 50,000 IU once weekly.  Her energy level has improved.  She is due for vitamin D level today

## 2023-09-11 NOTE — Progress Notes (Signed)
Office: [phone]  /  Fax: [phone]  WEIGHT SUMMARY AND BIOMETRICS  Starting Date: 07/27/22  Starting Weight: 310lb   Weight Lost Since Last Visit: 0lb   Vitals Temp: 98 F (36.7 C) BP: 121/85 Pulse Rate: 66 SpO2: 100 %   Body Composition  Body Fat %: 41.9 % Fat Mass (lbs): 124.6 lbs Muscle Mass (lbs): 164.2 lbs Total Body Water (lbs): 101.8 lbs Visceral Fat Rating : 11   HPI  Chief Complaint: OBESITY  Kathryn Huff is here to discuss her progress with her obesity treatment plan. She is on the the Category 4 Plan and states she is following her eating plan approximately 50 % of the time. She states she is exercising 0 minutes 0 times per week.  Interval History:  Since last office visit she is up 3 lb Muscle mass is up 7 pounds and body fat is down 4.4 pounds since last visit She has a net weight loss of 13 lb in the past 13 mos She is enjoying the satiety from Lomaira 8 mg bid  She has had more travel with eating out and forgetting to take her [place] lately This has lead to an increased appetite She has been less physically active with home workouts She does some walking at work and at home with her kids She plans to start Yoga with her friends  Pharmacotherapy: Lomaira 8 mg bid  PHYSICAL EXAM:  Blood pressure 121/85, pulse 66, temperature 98 F (36.7 C), height 5\' 10"  (1.778 m), weight 297 lb (134.7 kg), SpO2 100%. Body mass index is 42.62 kg/m.  General: She is overweight, cooperative, alert, well developed, and in no acute distress. PSYCH: Has normal mood, affect and thought process.   Lungs: Normal breathing effort, no conversational dyspnea.   ASSESSMENT AND PLAN  TREATMENT PLAN FOR OBESITY:  Recommended Dietary Goals  Kathryn Huff is currently in the action stage of change. As such, her goal is to continue weight management plan. She has agreed to the Category 4 Plan.  Behavioral Intervention  We discussed the following Behavioral Modification  Strategies today: increasing lean protein intake, decreasing simple carbohydrates , increasing vegetables, increasing lower glycemic fruits, increasing fiber rich foods, avoiding skipping meals, increasing water intake, work on meal planning and preparation, keeping healthy foods at home, continue to practice mindfulness when eating, and planning for success.  Additional resources provided today: NA  Recommended Physical Activity Goals  Kathryn Huff has been advised to work up to 150 minutes of moderate intensity aerobic activity a week and strengthening exercises 2-3 times per week for cardiovascular health, weight loss maintenance and preservation of muscle mass.   She has agreed to [employer] strengthening exercises with a goal of 2-3 sessions a week  and Increase the intensity, frequency or duration of aerobic exercises    Pharmacotherapy changes for the treatment of obesity: None  ASSOCIATED CONDITIONS ADDRESSED TODAY  Pre-diabetes Assessment & Plan: Lab Results  Component Value Date   HGBA1C 5.6 07/27/2022   She has a history of prediabetes which has improved over time.  She has actively been working on improving food choices, physical activity and weight reduction.  Repeat A1c today  Orders: -     Hemoglobin A1c -     Insulin, random -     Comprehensive metabolic panel  Polyphagia Assessment & Plan: Improving with use of Lomaira 8 mg tab twice daily.  She has had some associated dry mouth but no other adverse side effects.  She has seen muscle  gain and body fat loss since her last visit.  She feels improved satiety and had several days without taking Lomaira and noticed increased appetite.  Blood pressure and heart rate are under good control and she is not at risk for pregnancy.  Continue Lomaira 8 mg tab twice daily as directed.  Increase water intake.  Ensure adequate intake of nonstarchy vegetables, 1-2 servings of fruit daily and then daily protein target around 140  g/day  Orders: -     Lomaira; 1 tab po 30 min before breakfast and 1 tab po at 3 pm daily  Dispense: 60 tablet; Refill: 0  Vitamin D deficiency Assessment & Plan: Last vitamin D Lab Results  Component Value Date   VD25OH 19.4 (L) 03/28/2023   She has been taking vitamin D 50,000 IU once weekly.  Her energy level has improved.  She is due for vitamin D level today  Orders: -     VITAMIN D 25 Hydroxy (Vit-D Deficiency, Fractures)  Morbid obesity (HCC) with starting BMI 44  BMI 40.0-44.9, adult (HCC)      She was informed of the importance of frequent follow up visits to maximize her success with intensive lifestyle modifications for her multiple health conditions.   ATTESTASTION STATEMENTS:  Reviewed by clinician on day of visit: allergies, medications, problem list, medical history, surgical history, family history, social history, and previous encounter notes pertinent to obesity diagnosis.   I have personally spent 30 minutes total time today in preparation, patient care, nutritional counseling and documentation for this visit, including the following: review of clinical lab tests; review of medical tests/procedures/services.      Glennis Brink, DO DABFM, DABOM [facility] [address] [place], [place] [address] [phone]

## 2023-09-11 NOTE — Assessment & Plan Note (Signed)
>>  ASSESSMENT AND PLAN FOR PRE-DIABETES WRITTEN ON 09/11/2023  9:36 AM BY BOWEN, KAREN E, DO  Lab Results  Component Value Date   HGBA1C 5.6 07/27/2022   She has a history of prediabetes which has improved over time.  She has actively been working on improving food choices, physical activity and weight reduction.  Repeat A1c today

## 2023-09-11 NOTE — Assessment & Plan Note (Signed)
Lab Results  Component Value Date   HGBA1C 5.6 07/27/2022   She has a history of prediabetes which has improved over time.  She has actively been working on improving food choices, physical activity and weight reduction.  Repeat A1c today

## 2023-09-12 LAB — COMPREHENSIVE METABOLIC PANEL
ALT: 26 IU/L (ref 0–32)
AST: 14 IU/L (ref 0–40)
Albumin: 4 g/dL (ref 3.9–4.9)
Alkaline Phosphatase: 128 IU/L — ABNORMAL HIGH (ref 44–121)
BUN/Creatinine Ratio: 20 (ref 9–23)
BUN: 17 mg/dL (ref 6–20)
Bilirubin Total: 0.3 mg/dL (ref 0.0–1.2)
CO2: 22 mmol/L (ref 20–29)
Calcium: 9.4 mg/dL (ref 8.7–10.2)
Chloride: 103 mmol/L (ref 96–106)
Creatinine, Ser: 0.85 mg/dL (ref 0.57–1.00)
Globulin, Total: 2.5 g/dL (ref 1.5–4.5)
Glucose: 104 mg/dL — ABNORMAL HIGH (ref 70–99)
Potassium: 4.9 mmol/L (ref 3.5–5.2)
Sodium: 139 mmol/L (ref 134–144)
Total Protein: 6.5 g/dL (ref 6.0–8.5)
eGFR: 94 mL/min/{1.73_m2} (ref >59)

## 2023-09-12 LAB — VITAMIN D 25 HYDROXY (VIT D DEFICIENCY, FRACTURES): Vit D, 25-Hydroxy: 27.4 ng/mL — ABNORMAL LOW (ref 30.0–100.0)

## 2023-09-12 LAB — HEMOGLOBIN A1C
Est. average glucose Bld gHb Est-mCnc: 117 mg/dL
Hgb A1c MFr Bld: 5.7 % — ABNORMAL HIGH (ref 4.8–5.6)

## 2023-09-12 LAB — INSULIN, RANDOM: INSULIN: 29.3 u[IU]/mL — ABNORMAL HIGH (ref 2.6–24.9)

## 2023-10-09 ENCOUNTER — Ambulatory Visit: Admitting: Family Medicine

## 2023-12-12 ENCOUNTER — Encounter: Payer: Self-pay | Admitting: Family Medicine

## 2023-12-12 ENCOUNTER — Ambulatory Visit: Admitting: Family Medicine

## 2023-12-12 VITALS — BP 118/79 | HR 60 | Temp 97.8°F | Ht 70.0 in | Wt 295.0 lb

## 2023-12-12 DIAGNOSIS — R7303 Prediabetes: Secondary | ICD-10-CM | POA: Diagnosis not present

## 2023-12-12 DIAGNOSIS — E559 Vitamin D deficiency, unspecified: Secondary | ICD-10-CM | POA: Diagnosis not present

## 2023-12-12 DIAGNOSIS — E66813 Obesity, class 3: Secondary | ICD-10-CM

## 2023-12-12 DIAGNOSIS — F411 Generalized anxiety disorder: Secondary | ICD-10-CM | POA: Diagnosis not present

## 2023-12-12 DIAGNOSIS — Z6841 Body Mass Index (BMI) 40.0 and over, adult: Secondary | ICD-10-CM

## 2023-12-12 MED ORDER — ZEPBOUND 2.5 MG/0.5ML ~~LOC~~ SOAJ
2.5000 mg | SUBCUTANEOUS | 0 refills | Status: DC
Start: 2023-12-12 — End: 2024-01-11

## 2023-12-12 MED ORDER — VITAMIN D (ERGOCALCIFEROL) 1.25 MG (50000 UNIT) PO CAPS: 50000.0000 [IU] | ORAL_CAPSULE | ORAL | 0 refills | Status: DC
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
## 2023-12-12 NOTE — Assessment & Plan Note (Signed)
Lab Results  Component Value Date   HGBA1C 5.7 (H) 09/11/2023   She does remain in the prediabetic range.  She has been working on reducing her intake of added sugar and refined carbohydrates.  She does plan on adding in both walking and yoga for exercise.  She reports room for improvement with both diet and exercise.  She is motivated to continue working on lifestyle changes.  Plan to repeat A1c and fasting insulin in the next 2 months

## 2023-12-12 NOTE — Assessment & Plan Note (Signed)
Last vitamin D Lab Results  Component Value Date   VD25OH 27.4 (L) 09/11/2023   She is taking vitamin D 50,000 IU once weekly.  Her last vitamin D level was still suboptimal.  She has complaints of fatigue.  Repeat level in 2 months
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
## 2023-12-12 NOTE — Assessment & Plan Note (Signed)
>>  ASSESSMENT AND PLAN FOR PRE-DIABETES WRITTEN ON 12/12/2023  9:04 AM BY BOWEN, KAREN E, DO  Lab Results  Component Value Date   HGBA1C 5.7 (H) 09/11/2023   She does remain in the prediabetic range.  She has been working on reducing her intake of added sugar and refined carbohydrates.  She does plan on adding in both walking and yoga for exercise.  She reports room for improvement with both diet and exercise.  She is motivated to continue working on lifestyle changes.  Plan to repeat A1c and fasting insulin  in the next 2 months

## 2023-12-14 ENCOUNTER — Telehealth: Payer: Self-pay | Admitting: *Deleted

## 2023-12-14 NOTE — Telephone Encounter (Signed)
Prior authorization done via cover my meds for patients Zepbound. Waiting on determination.   

## 2023-12-29 IMAGING — DX DG ELBOW COMPLETE 3+V*R*
4 series · 4 of 4 positions shown · non-contrast
Comparison: None.

CLINICAL DATA: Injury, fall today pain right upper forearm into the
right elbow.

EXAM:
RIGHT ELBOW - COMPLETE 3+ VIEW

[elbow ap]
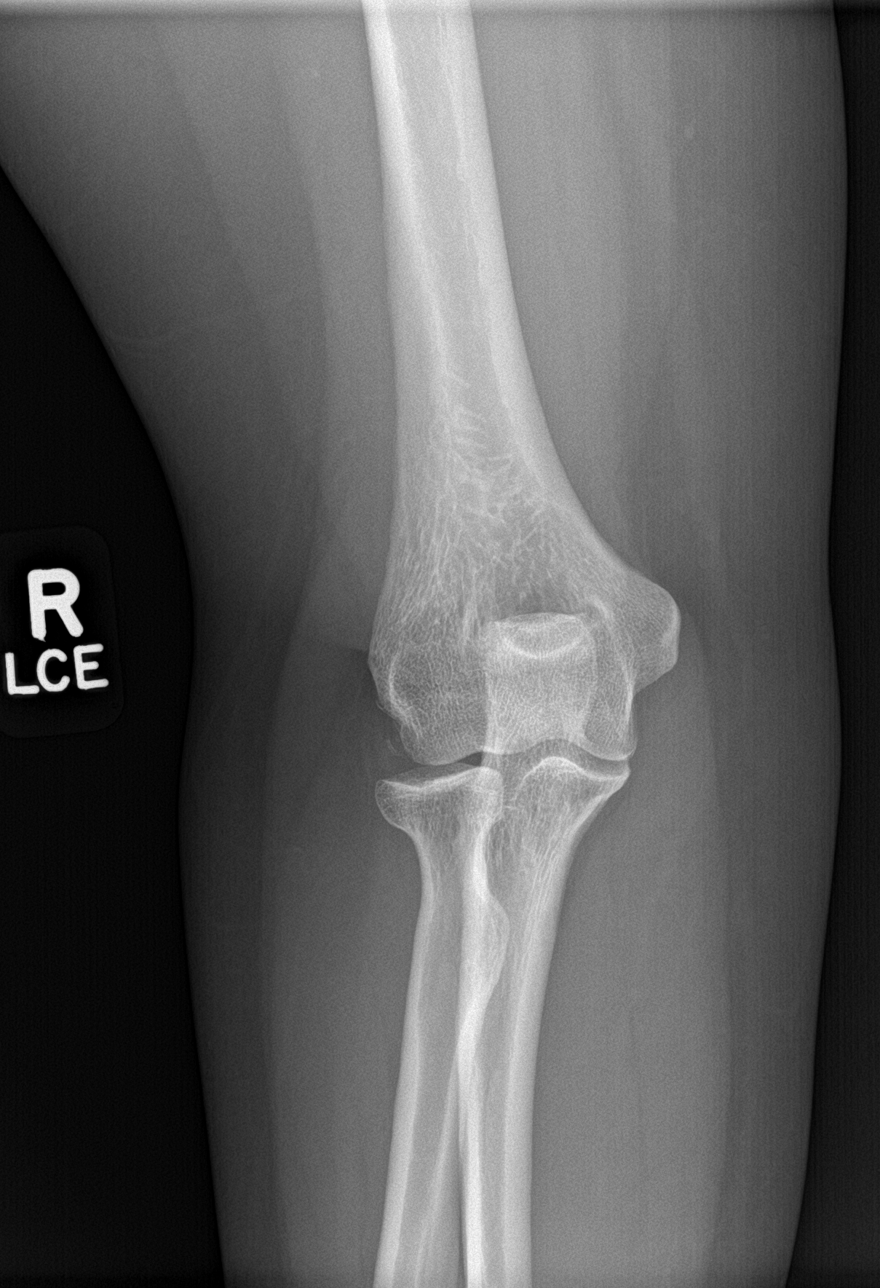

[elbow obl (1 of 2)]
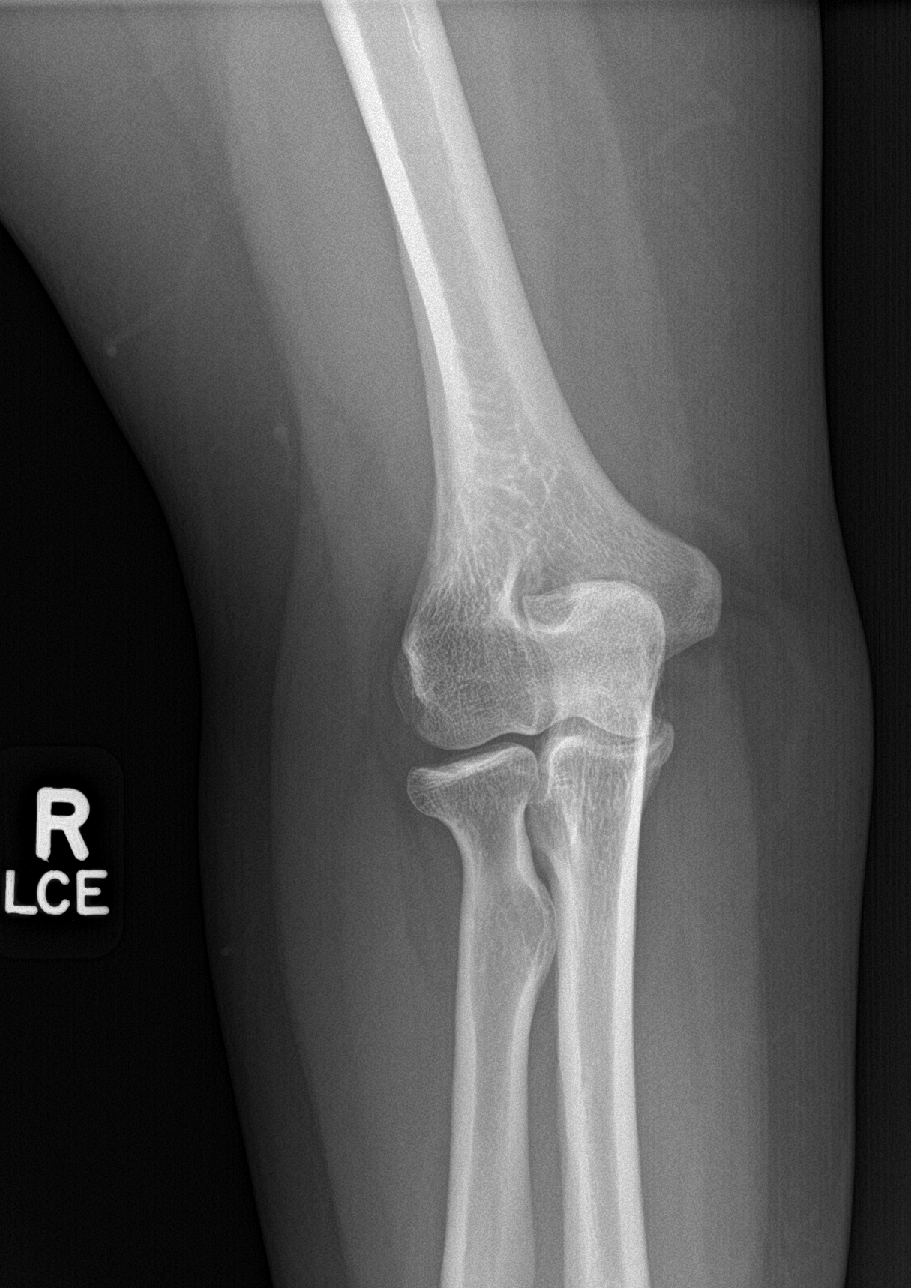

[elbow obl (2 of 2)]
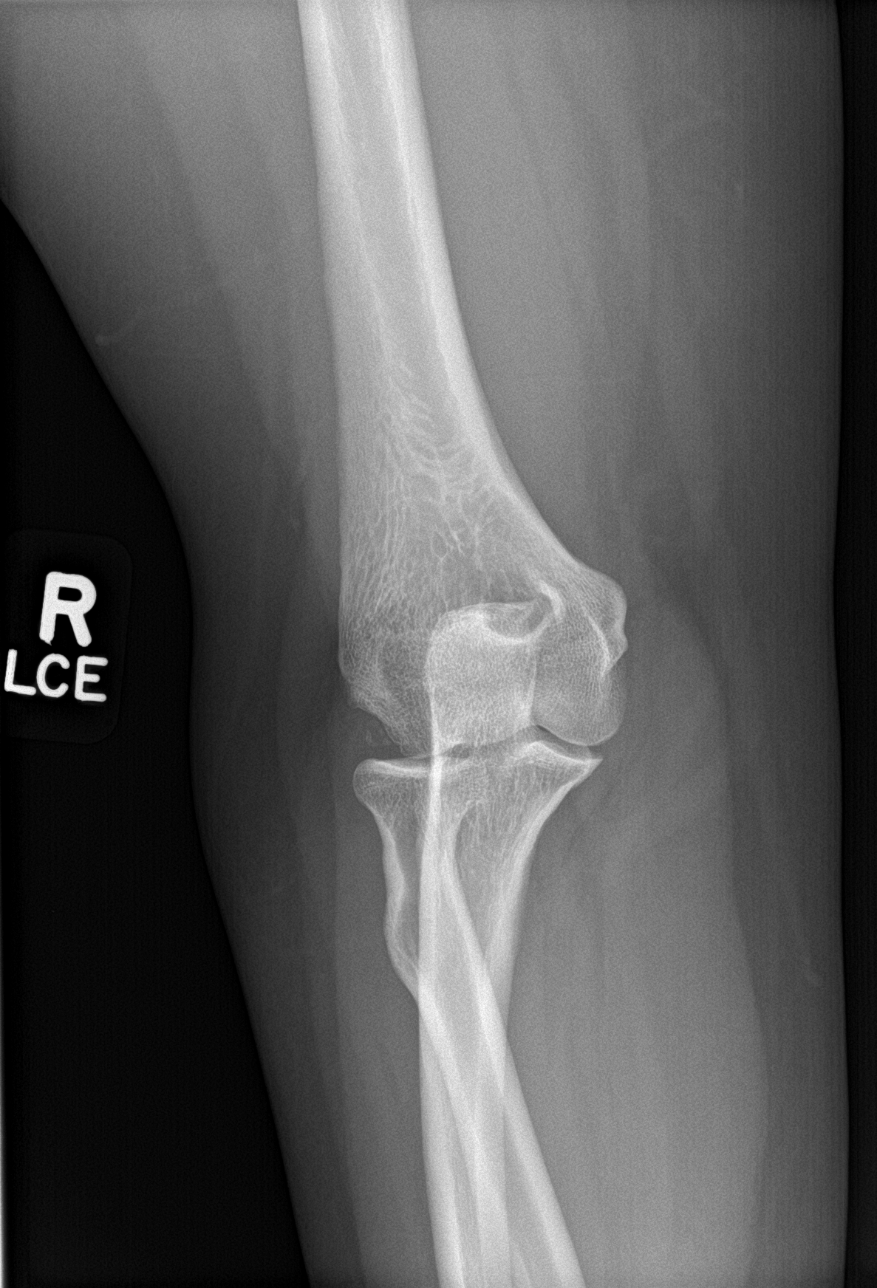

[elbow lat]
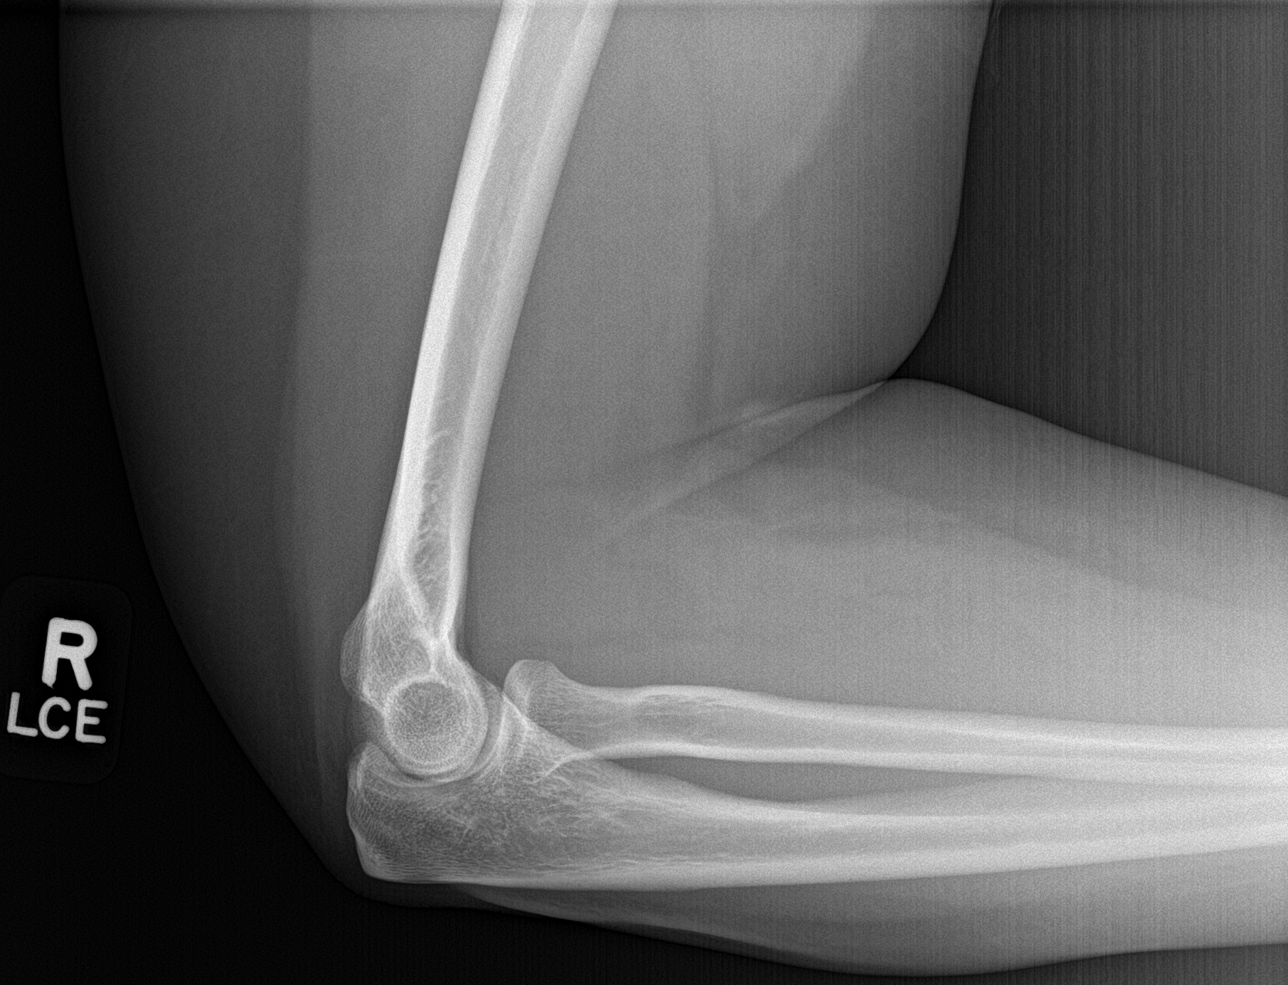

[4 of 4 positions shown; findings below may reference images not displayed]

FINDINGS: There is no evidence of fracture, dislocation, or joint effusion.
There is no evidence of arthropathy or other focal bone abnormality.
Soft tissues are unremarkable.
IMPRESSION: No acute fracture or dislocation.

## 2024-01-01 NOTE — Telephone Encounter (Signed)
 Patients Zepbound is denied. Will discuss other options at her follow up.

## 2024-01-11 ENCOUNTER — Encounter: Payer: Self-pay | Admitting: Family Medicine

## 2024-01-11 ENCOUNTER — Telehealth (INDEPENDENT_AMBULATORY_CARE_PROVIDER_SITE_OTHER): Payer: Self-pay | Admitting: *Deleted

## 2024-01-11 ENCOUNTER — Ambulatory Visit: Admitting: Family Medicine

## 2024-01-11 VITALS — BP 137/84 | HR 74 | Temp 98.1°F | Ht 70.0 in | Wt 302.0 lb

## 2024-01-11 DIAGNOSIS — R5383 Other fatigue: Secondary | ICD-10-CM | POA: Diagnosis not present

## 2024-01-11 DIAGNOSIS — R7303 Prediabetes: Secondary | ICD-10-CM

## 2024-01-11 DIAGNOSIS — E559 Vitamin D deficiency, unspecified: Secondary | ICD-10-CM

## 2024-01-11 DIAGNOSIS — Z13220 Encounter for screening for lipoid disorders: Secondary | ICD-10-CM

## 2024-01-11 DIAGNOSIS — R635 Abnormal weight gain: Secondary | ICD-10-CM

## 2024-01-11 DIAGNOSIS — E66813 Obesity, class 3: Secondary | ICD-10-CM

## 2024-01-11 DIAGNOSIS — Z6841 Body Mass Index (BMI) 40.0 and over, adult: Secondary | ICD-10-CM

## 2024-01-11 MED ORDER — WEGOVY 0.25 MG/0.5ML ~~LOC~~ SOAJ: 0.2500 mg | SUBCUTANEOUS | 0 refills | Status: DC

## 2024-01-11 NOTE — Progress Notes (Signed)
Office: [phone]  /  Fax: [phone]  WEIGHT SUMMARY AND BIOMETRICS  Starting Date: 07/27/22  Starting Weight: 310lb   Weight Lost Since Last Visit: 0   Vitals Temp: 98.1 F (36.7 C) BP: 137/84 Pulse Rate: 74 SpO2: 100 %   Body Composition  Body Fat %: 46.2 % Fat Mass (lbs): 139.8 lbs Muscle Mass (lbs): 154.4 lbs Total Body Water (lbs): 98.2 lbs Visceral Fat Rating : 13    HPI  Chief Complaint: OBESITY  Kathryn Huff is here to discuss her progress with her obesity treatment plan. She is on the the Category 4 Plan and states she is following her eating plan approximately 50 % of the time. She states she is exercising 15 minutes 3 times per week.  Interval History:  Since last office visit she is up 7 lb This gives her a net weight loss of 8 pounds in the past 17 months of medically supervised weight management She has been hungrier off of Lomaira wanting more snacks Admits to boredom snacking  She is trying to pick better options Stress levels have been high She has a good support system at home Zepbound was denied by insurance She has been less physically active since last visit  Pharmacotherapy: None  PHYSICAL EXAM:  Blood pressure 137/84, pulse 74, temperature 98.1 F (36.7 C), height 5\' 10"  (1.778 m), weight (!) 302 lb (137 kg), last menstrual period 12/05/2023, SpO2 100%. Body mass index is 43.33 kg/m.  General: She is overweight, cooperative, alert, well developed, and in no acute distress. PSYCH: Has normal mood, affect and thought process.   Lungs: Normal breathing effort, no conversational dyspnea.   ASSESSMENT AND PLAN  TREATMENT PLAN FOR OBESITY:  Recommended Dietary Goals  Kathryn Huff is currently in the action stage of change. As such, her goal is to continue weight management plan. She has agreed to the Category 4 Plan.  Behavioral Intervention  We discussed the following Behavioral Modification Strategies today: increasing lean protein  intake to established goals, increasing fiber rich foods, avoiding skipping meals, increasing water intake , work on meal planning and preparation, keeping healthy foods at home, identifying sources and decreasing liquid calories, decreasing eating out or consumption of processed foods, and making healthy choices when eating convenient foods, practice mindfulness eating and understand the difference between hunger signals and cravings, work on managing stress, creating time for self-care and relaxation, avoiding temptations and identifying enticing environmental cues, and continue to work on maintaining a reduced calorie state, getting the recommended amount of protein, incorporating whole foods, making healthy choices, staying well hydrated and practicing mindfulness when eating..  Additional resources provided today: NA  Recommended Physical Activity Goals  Kathryn Huff has been advised to work up to 150 minutes of moderate intensity aerobic activity a week and strengthening exercises 2-3 times per week for cardiovascular health, weight loss maintenance and preservation of muscle mass.   She has agreed to Increase the intensity, frequency or duration of aerobic exercises    Pharmacotherapy changes for the treatment of obesity: Begin Wegovy 0.25 mg once weekly injection Patient denies a personal or family history of pancreatitis, medullary thyroid carcinoma or multiple endocrine neoplasia type II. Recommend reviewing pen training video online. Avoid pregnancy while on Wegovy LMP 2 weeks ago, monitoring on a period tracking app  ASSOCIATED CONDITIONS ADDRESSED TODAY  Vitamin D deficiency Assessment & Plan: Last vitamin D Lab Results  Component Value Date   VD25OH 27.4 (L) 09/11/2023   She has not been taking  her prescription vitamin D for the past 4 weeks Energy levels have been low  Repeat vitamin D level with labs today Plan to resume vitamin D 50,000 IU once weekly with a target goal over  50  Orders: -     VITAMIN D 25 Hydroxy (Vit-D Deficiency, Fractures) -     Insulin, random -     Hemoglobin A1c  Weight gain -     Comprehensive metabolic panel -     TSH Rfx on Abnormal to Free T4  Pre-diabetes Assessment & Plan: Lab Results  Component Value Date   HGBA1C 5.7 (H) 09/11/2023   Her last A1c was in the prediabetic range along with an elevated fasting insulin level of 29.3.  She has never used metformin.  She has been struggling more with hyperphagia, carb and sugar cravings and weight gain.  Exercise has been limited due to weather and time constraints.  Repeat labs today.  Consider the addition of metformin.  Plan to increase walking time in the next 1 to 2 months Avoid high sugar foods and drinks   Screening, lipid -     Lipid panel  Other fatigue -     Ferritin  Class 3 severe obesity due to excess calories with body mass index (BMI) of 40.0 to 44.9 in adult, unspecified whether serious comorbidity present [facility]) Assessment & Plan: Patient has not met her expected weight loss of at least 5% total body weight loss since starting her program of medically supervised weight management 17 months ago.  She has had limited success from use of Lomaira.  Unfortunately, her insurance did not cover cover use of Zepbound and she continues to struggle with increased appetite and food cravings.  Her obesity is complicated by insulin resistance, prediabetes and dyslipidemia.  Will update labs today. Resume category 4 meal plan Plan for increased exercise over the next 1 to 2 months Continue to work on stress reduction, mindful eating, meal planning and keeping junk food snacks out of the house Begin Wegovy 0.25 mg once weekly injection Reviewed potential adverse side effects and [email or web address] for further information  Orders: -     WUJWJX; Inject 0.25 mg into the skin once a week.  Dispense: 2 mL; Refill: 0      She was informed of the importance of frequent follow up  visits to maximize her success with intensive lifestyle modifications for her multiple health conditions.   ATTESTASTION STATEMENTS:  Reviewed by clinician on day of visit: allergies, medications, problem list, medical history, surgical history, family history, social history, and previous encounter notes pertinent to obesity diagnosis.   I have personally spent 30 minutes total time today in preparation, patient care, nutritional counseling and documentation for this visit, including the following: review of clinical lab tests; review of medical tests/procedures/services.      Glennis Brink, DO DABFM, DABOM [facility] [address] [place], [place] [address] [phone]

## 2024-01-11 NOTE — Assessment & Plan Note (Signed)
>>  ASSESSMENT AND PLAN FOR PRE-DIABETES WRITTEN ON 01/11/2024  8:22 AM BY BOWEN, KAREN E, DO  Lab Results  Component Value Date   HGBA1C 5.7 (H) 09/11/2023   Her last A1c was in the prediabetic range along with an elevated fasting insulin  level of 29.3.  She has never used metformin .  She has been struggling more with hyperphagia, carb and sugar cravings and weight gain.  Exercise has been limited due to weather and time constraints.  Repeat labs today.  Consider the addition of metformin .  Plan to increase walking time in the next 1 to 2 months Avoid high sugar foods and drinks

## 2024-01-11 NOTE — Assessment & Plan Note (Signed)
Last vitamin D Lab Results  Component Value Date   VD25OH 27.4 (L) 09/11/2023   She has not been taking her prescription vitamin D for the past 4 weeks Energy levels have been low  Repeat vitamin D level with labs today Plan to resume vitamin D 50,000 IU once weekly with a target goal over 50

## 2024-01-11 NOTE — Telephone Encounter (Signed)
Prior authorization done via cover my meds for patients. Wegovy. Waiting on determination.

## 2024-01-11 NOTE — Assessment & Plan Note (Signed)
Lab Results  Component Value Date   HGBA1C 5.7 (H) 09/11/2023   Her last A1c was in the prediabetic range along with an elevated fasting insulin level of 29.3.  She has never used metformin.  She has been struggling more with hyperphagia, carb and sugar cravings and weight gain.  Exercise has been limited due to weather and time constraints.  Repeat labs today.  Consider the addition of metformin.  Plan to increase walking time in the next 1 to 2 months Avoid high sugar foods and drinks

## 2024-01-11 NOTE — Assessment & Plan Note (Signed)
Patient has not met her expected weight loss of at least 5% total body weight loss since starting her program of medically supervised weight management 17 months ago.  She has had limited success from use of Lomaira.  Unfortunately, her insurance did not cover cover use of Zepbound and she continues to struggle with increased appetite and food cravings.  Her obesity is complicated by insulin resistance, prediabetes and dyslipidemia.  Will update labs today. Resume category 4 meal plan Plan for increased exercise over the next 1 to 2 months Continue to work on stress reduction, mindful eating, meal planning and keeping junk food snacks out of the house Begin Wegovy 0.25 mg once weekly injection Reviewed potential adverse side effects and [email or web address] for further information

## 2024-01-12 LAB — COMPREHENSIVE METABOLIC PANEL
ALT: 31 [IU]/L (ref 0–32)
AST: 21 [IU]/L (ref 0–40)
Albumin: 3.8 g/dL — ABNORMAL LOW (ref 3.9–4.9)
Alkaline Phosphatase: 130 [IU]/L — ABNORMAL HIGH (ref 44–121)
BUN/Creatinine Ratio: 24 — ABNORMAL HIGH (ref 9–23)
BUN: 18 mg/dL (ref 6–20)
Bilirubin Total: 0.3 mg/dL (ref 0.0–1.2)
CO2: 20 mmol/L (ref 20–29)
Calcium: 8.9 mg/dL (ref 8.7–10.2)
Chloride: 102 mmol/L (ref 96–106)
Creatinine, Ser: 0.75 mg/dL (ref 0.57–1.00)
Globulin, Total: 2.5 g/dL (ref 1.5–4.5)
Glucose: 123 mg/dL — ABNORMAL HIGH (ref 70–99)
Potassium: 4.6 mmol/L (ref 3.5–5.2)
Sodium: 138 mmol/L (ref 134–144)
Total Protein: 6.3 g/dL (ref 6.0–8.5)
eGFR: 108 mL/min/{1.73_m2} (ref >59)

## 2024-01-12 LAB — LIPID PANEL
Chol/HDL Ratio: 3.3 {ratio} (ref 0.0–4.4)
Cholesterol, Total: 157 mg/dL (ref 100–199)
HDL: 47 mg/dL (ref >39)
LDL Chol Calc (NIH): 90 mg/dL (ref 0–99)
Triglycerides: 112 mg/dL (ref 0–149)
VLDL Cholesterol Cal: 20 mg/dL (ref 5–40)

## 2024-01-12 LAB — HEMOGLOBIN A1C
Est. average glucose Bld gHb Est-mCnc: 117 mg/dL
Hgb A1c MFr Bld: 5.7 % — ABNORMAL HIGH (ref 4.8–5.6)

## 2024-01-12 LAB — TSH RFX ON ABNORMAL TO FREE T4: TSH: 2.79 u[IU]/mL (ref 0.450–4.500)

## 2024-01-12 LAB — INSULIN, RANDOM: INSULIN: 103 u[IU]/mL — ABNORMAL HIGH (ref 2.6–24.9)

## 2024-01-12 LAB — VITAMIN D 25 HYDROXY (VIT D DEFICIENCY, FRACTURES): Vit D, 25-Hydroxy: 15.4 ng/mL — ABNORMAL LOW (ref 30.0–100.0)

## 2024-01-12 LAB — FERRITIN: Ferritin: 28 ng/mL (ref 15–150)

## 2024-01-16 NOTE — Telephone Encounter (Signed)
Prior authorization was denied for patients Wegovy. Patient has been notified.

## 2024-02-07 ENCOUNTER — Ambulatory Visit: Admitting: Family Medicine

## 2024-02-14 ENCOUNTER — Other Ambulatory Visit: Payer: Self-pay | Admitting: Family Medicine

## 2024-02-14 ENCOUNTER — Ambulatory Visit (INDEPENDENT_AMBULATORY_CARE_PROVIDER_SITE_OTHER): Admitting: Family Medicine

## 2024-02-14 ENCOUNTER — Encounter: Payer: Self-pay | Admitting: Family Medicine

## 2024-02-14 VITALS — BP 128/73 | HR 68 | Temp 98.0°F | Ht 70.0 in | Wt 305.0 lb

## 2024-02-14 DIAGNOSIS — E88819 Insulin resistance, unspecified: Secondary | ICD-10-CM

## 2024-02-14 DIAGNOSIS — E66813 Obesity, class 3: Secondary | ICD-10-CM | POA: Diagnosis not present

## 2024-02-14 DIAGNOSIS — E559 Vitamin D deficiency, unspecified: Secondary | ICD-10-CM

## 2024-02-14 DIAGNOSIS — Z6841 Body Mass Index (BMI) 40.0 and over, adult: Secondary | ICD-10-CM

## 2024-02-14 DIAGNOSIS — R748 Abnormal levels of other serum enzymes: Secondary | ICD-10-CM | POA: Insufficient documentation

## 2024-02-14 DIAGNOSIS — R7303 Prediabetes: Secondary | ICD-10-CM

## 2024-02-14 MED ORDER — METFORMIN HCL 500 MG PO TABS
ORAL_TABLET | ORAL | 0 refills | Status: DC
Start: 2024-02-14 — End: 2024-03-07

## 2024-02-14 NOTE — Progress Notes (Signed)
 Office: [phone]  /  Fax: [phone]  WEIGHT SUMMARY AND BIOMETRICS  Starting Date: 07/27/22  Starting Weight: 310lb   Weight Lost Since Last Visit: 0lb   Vitals Temp: 98 F (36.7 C) BP: 128/73 Pulse Rate: 68 SpO2: 100 %   Body Composition  Body Fat %: 47.4 % Fat Mass (lbs): 145 lbs Muscle Mass (lbs): 152.8 lbs Total Body Water (lbs): 102.6 lbs Visceral Fat Rating : 13     HPI  Chief Complaint: OBESITY  Kathryn Huff is here to discuss her progress with her obesity treatment plan. She is on the the Category 4 Plan and states she is following her eating plan approximately 40 % of the time. She states she is exercising 30 minutes 4 times per week.  Interval History:  Since last office visit she is up 3 lb She has been feeling hungrier, over snacking later in the day Wegovy was not covered by insurance She struggles more at home alone with some boredom eating and sugar cravings She denies meal skipping She is trying to get in more fruit This gives her a net weight loss of 5 lb in 1.5 years She has not considered bariatric surgery  Pharmacotherapy: none  PHYSICAL EXAM:  Blood pressure 128/73, pulse 68, temperature 98 F (36.7 C), height 5\' 10"  (1.778 m), weight (!) 305 lb (138.3 kg), SpO2 100%. Body mass index is 43.76 kg/m.  General: She is overweight, cooperative, alert, well developed, and in no acute distress. PSYCH: Has normal mood, affect and thought process.   Lungs: Normal breathing effort, no conversational dyspnea.   ASSESSMENT AND PLAN  TREATMENT PLAN FOR OBESITY:  Recommended Dietary Goals  Kathryn Huff is currently in the action stage of change. As such, her goal is to continue weight management plan. She has agreed to the Category 4 Plan. Copy of cat 4 meal plan given Copy of 100 cal snack list given Recommend use of Chat GPT to help find 'gluten free/ high fiber snacks' Can add in Metamucil + full glass of water mid morning to bump up fiber  intake  Behavioral Intervention  We discussed the following Behavioral Modification Strategies today: increasing lean protein intake to established goals, increasing fiber rich foods, increasing water intake , work on meal planning and preparation, keeping healthy foods at home, decreasing eating out or consumption of processed foods, and making healthy choices when eating convenient foods, practice mindfulness eating and understand the difference between hunger signals and cravings, and continue to work on maintaining a reduced calorie state, getting the recommended amount of protein, incorporating whole foods, making healthy choices, staying well hydrated and practicing mindfulness when eating..  Additional resources provided today: NA  Recommended Physical Activity Goals  Kathryn Huff has been advised to work up to 150 minutes of moderate intensity aerobic activity a week and strengthening exercises 2-3 times per week for cardiovascular health, weight loss maintenance and preservation of muscle mass.   She has agreed to Start aerobic activity with a goal of 150 minutes a week at moderate intensity.   Pharmacotherapy changes for the treatment of obesity: Begin metformin 250 mg with breakfast and dinner daily  ASSOCIATED CONDITIONS ADDRESSED TODAY  Insulin resistance Reviewed lab from last visit.  Fasting insulin was quite high but she reports not being fasting for her labs She reports dizziness from metformin but was not adherent to her diet and this was before her diagnosis of celiac disease She is working on dietary change along with regular exercise  Work  on limiting intake of added sugar and refined carbohydrates, reading labels on food and drink for added sugar avoiding products with over 8 g of added sugar per serving  Plan increase walking time to 30 minutes 5 days a week Retry metformin on small dose 250 mg twice daily with food -     metFORMIN HCl; 1/2 tab po with breakfast and dinner  daily  Dispense: 30 tablet; Refill: 0  Class 3 severe obesity due to excess calories with body mass index (BMI) of 40.0 to 44.9 in adult, unspecified whether serious comorbidity present [facility])  Vitamin D deficiency Reviewed lab result with patient from last visit.  Her vitamin D level remains low despite taking vitamin D 50,000 IU once weekly.  Energy level remains fairly low.  Discontinue vitamin D 50,000 IU once weekly.  Begin vitamin D3 plus K2 at 5000 IU once daily Repeat lab in 4 months  Pre-diabetes Continue active plan for weight reduction and begin metformin low-dose  Elevated alkaline phosphatase level Reviewed lab from last visit.  She still has mild elevation of her alkaline phosphatase level with a normal AST and ALT.  She denies a previous diagnosis of fatty liver disease.  She denies right upper quadrant pain or postprandial nausea or vomiting.  She still has her gallbladder.  She reports having a normal bone density test in the past with no recent bony injury.  Consider referral back to [facility] for further evaluation and treatment.  Consider right upper quadrant ultrasound if alk phos remains elevated at her next lab draw.     She was informed of the importance of frequent follow up visits to maximize her success with intensive lifestyle modifications for her multiple health conditions.   ATTESTASTION STATEMENTS:  Reviewed by clinician on day of visit: allergies, medications, problem list, medical history, surgical history, family history, social history, and previous encounter notes pertinent to obesity diagnosis.   I have personally spent 30 minutes total time today in preparation, patient care, nutritional counseling and documentation for this visit, including the following: review of clinical lab tests; review of medical tests/procedures/services.      Kathryn Brink, DO DABFM, DABOM [facility] [address] [place], [place] [address] [phone]

## 2024-03-07 ENCOUNTER — Ambulatory Visit: Admitting: Family Medicine

## 2024-03-07 ENCOUNTER — Other Ambulatory Visit: Payer: Self-pay | Admitting: Family Medicine

## 2024-03-07 ENCOUNTER — Encounter: Payer: Self-pay | Admitting: Family Medicine

## 2024-03-07 VITALS — BP 123/80 | HR 67 | Temp 98.0°F | Ht 70.0 in | Wt 306.0 lb

## 2024-03-07 DIAGNOSIS — R632 Polyphagia: Secondary | ICD-10-CM | POA: Diagnosis not present

## 2024-03-07 DIAGNOSIS — E66813 Obesity, class 3: Secondary | ICD-10-CM | POA: Diagnosis not present

## 2024-03-07 DIAGNOSIS — E88819 Insulin resistance, unspecified: Secondary | ICD-10-CM

## 2024-03-07 DIAGNOSIS — Z6841 Body Mass Index (BMI) 40.0 and over, adult: Secondary | ICD-10-CM

## 2024-03-07 DIAGNOSIS — R7303 Prediabetes: Secondary | ICD-10-CM

## 2024-03-07 MED ORDER — METFORMIN HCL 500 MG PO TABS: 500.0000 mg | ORAL_TABLET | Freq: Two times a day (BID) | ORAL | 1 refills | Status: DC

## 2024-03-07 MED ORDER — LOMAIRA 8 MG PO TABS: ORAL_TABLET | ORAL | 0 refills | Status: DC

## 2024-03-07 NOTE — Progress Notes (Addendum)
 Office: [phone]  /  Fax: [phone]  WEIGHT SUMMARY AND BIOMETRICS  Starting Date: 07/27/22  Starting Weight: 310lb   Weight Lost Since Last Visit: 0lb   Vitals Temp: 98 F (36.7 C) BP: 123/80 Pulse Rate: 67 SpO2: 97 %   Body Composition  Body Fat %: 47.9 % Fat Mass (lbs): 146.8 lbs Muscle Mass (lbs): 151.6 lbs Total Body Water (lbs): 104.8 lbs Visceral Fat Rating : 13    HPI  Chief Complaint: OBESITY  Kathryn Huff is here to discuss her progress with her obesity treatment plan. She is on the the Category 4 Plan and states she is following her eating plan approximately 80 % of the time. She states she is exercising 30 minutes 3-4 times per week.  Interval History:  Since last office visit she is up 1 lb She is down 1.2 lb of muscle mass and up 1.8 lb of body fat since last visit This gives her a net weight loss of 4 pounds in the past 1.5 years of medically supervised weight management She is doing better with food choices and meal planning She has been working on increasing exercise frequency, playing outside with her kids She is still having some hunger and cravings She is tolerating the new start of metformin  at one half tab twice daily  Pharmacotherapy: Metformin  250 mg twice daily with food  PHYSICAL EXAM:  Blood pressure 123/80, pulse 67, temperature 98 F (36.7 C), height 5\' 10"  (1.778 m), weight (!) 306 lb (138.8 kg), SpO2 97%. Body mass index is 43.91 kg/m.  General: She is overweight, cooperative, alert, well developed, and in no acute distress. PSYCH: Has normal mood, affect and thought process.   Lungs: Normal breathing effort, no conversational dyspnea.   ASSESSMENT AND PLAN  TREATMENT PLAN FOR OBESITY:  Recommended Dietary Goals  Kathryn Huff is currently in the action stage of change. As such, her goal is to continue weight management plan. She has agreed to the Category 4 Plan and practicing portion control and making smarter food choices,  such as increasing vegetables and decreasing simple carbohydrates.  Behavioral Intervention  We discussed the following Behavioral Modification Strategies today: increasing lean protein intake to established goals, increasing fiber rich foods, work on meal planning and preparation, work on Counselling psychologist calories using tracking application, keeping healthy foods at home, practice mindfulness eating and understand the difference between hunger signals and cravings, work on managing stress, creating time for self-care and relaxation, avoiding temptations and identifying enticing environmental cues, and continue to work on maintaining a reduced calorie state, getting the recommended amount of protein, incorporating whole foods, making healthy choices, staying well hydrated and practicing mindfulness when eating.. She has the option to track her calorie intake using a calorie tracking app with 1800 cal/day which should include 100+ grams of protein daily  Additional resources provided today: NA  Recommended Physical Activity Goals  Kathryn Huff has been advised to work up to 150 minutes of moderate intensity aerobic activity a week and strengthening exercises 2-3 times per week for cardiovascular health, weight loss maintenance and preservation of muscle mass.   She has agreed to Start aerobic activity with a goal of 150 minutes a week at moderate intensity.   Pharmacotherapy changes for the treatment of obesity: Restart Lomaira  8 mg tab twice daily PDMP has been reviewed Will closely monitor blood pressure and heart rate Avoid pregnancy, currently using condoms and a period Tracking app  ASSOCIATED CONDITIONS ADDRESSED TODAY  Insulin  resistance Tolerating  new start metformin .  Has seen some improved food cravings.  Has been working on reducing intake of added sugar and refined carbohydrates will focusing on lean protein and fiber with meals.  Plan to increase exercise frequency this  spring.  Increase metformin  to 500 mg twice daily with food.  Recheck labs in 1 to 2 months -     metFORMIN  HCl; Take 1 tablet (500 mg total) by mouth 2 (two) times daily with a meal.  Dispense: 60 tablet; Refill: 1  Class 3 severe obesity due to excess calories with serious comorbidity and body mass index (BMI) of 40.0 to 44.9 in adult (HCC) -     Lomaira ; 1 tab po 30 min before breakfast and 1 tab po 1-2 hrs after lunch daily  Dispense: 28 tablet; Refill: 0  Polyphagia Polyphasia continues to be an issue, likely driven by insulin  resistance, lack of adequate protein and fiber intake.  She is still working on eating schedule, meal planning and food choices.  She has been working on increasing her intake of water.  She is a good candidate to retrial Lomaira  8 mg tab twice daily which helped some last year.  Pre-diabetes Lab Results  Component Value Date   HGBA1C 5.7 (H) 01/11/2024   Continue to work on healthy lifestyle changes and weight reduction.  Will look for improving A1c levels in the next 6 months     She was informed of the importance of frequent follow up visits to maximize her success with intensive lifestyle modifications for her multiple health conditions.   ATTESTASTION STATEMENTS:  Reviewed by clinician on day of visit: allergies, medications, problem list, medical history, surgical history, family history, social history, and previous encounter notes pertinent to obesity diagnosis.   I have personally spent 30 minutes total time today in preparation, patient care, nutritional counseling and documentation for this visit, including the following: review of clinical lab tests; review of medical tests/procedures/services.      Luana Rumple, DO DABFM, DABOM [facility] [address] [place], [place] [address] [phone]

## 2024-04-09 ENCOUNTER — Ambulatory Visit: Admitting: Family Medicine

## 2024-04-09 ENCOUNTER — Encounter: Payer: Self-pay | Admitting: Family Medicine

## 2024-04-09 VITALS — BP 105/66 | HR 60 | Temp 97.8°F | Ht 70.0 in | Wt 302.0 lb

## 2024-04-09 DIAGNOSIS — E559 Vitamin D deficiency, unspecified: Secondary | ICD-10-CM

## 2024-04-09 DIAGNOSIS — R7303 Prediabetes: Secondary | ICD-10-CM

## 2024-04-09 DIAGNOSIS — R748 Abnormal levels of other serum enzymes: Secondary | ICD-10-CM | POA: Diagnosis not present

## 2024-04-09 DIAGNOSIS — E88819 Insulin resistance, unspecified: Secondary | ICD-10-CM

## 2024-04-09 DIAGNOSIS — Z6841 Body Mass Index (BMI) 40.0 and over, adult: Secondary | ICD-10-CM

## 2024-04-09 DIAGNOSIS — E66813 Obesity, class 3: Secondary | ICD-10-CM

## 2024-04-09 MED ORDER — LOMAIRA 8 MG PO TABS: ORAL_TABLET | ORAL | 0 refills | Status: DC

## 2024-04-09 MED ORDER — METFORMIN HCL 500 MG PO TABS: 500.0000 mg | ORAL_TABLET | Freq: Two times a day (BID) | ORAL | 1 refills | Status: DC

## 2024-04-09 NOTE — Progress Notes (Signed)
 Office: [phone]  /  Fax: [phone]  WEIGHT SUMMARY AND BIOMETRICS  Starting Date: 07/27/22  Starting Weight: 310lb   Weight Lost Since Last Visit: 4lb   Vitals Temp: 97.8 F (36.6 C) BP: 105/66 Pulse Rate: 60 SpO2: 100 %   Body Composition  Body Fat %: 44.4 % Fat Mass (lbs): 134 lbs Muscle Mass (lbs): 159.6 lbs Total Body Water (lbs): 104.6 lbs Visceral Fat Rating : 12     HPI  Chief Complaint: OBESITY  Kathryn Huff is here to discuss her progress with her obesity treatment plan. She is on the the Category 4 Plan and states she is following her eating plan approximately 75 % of the time. She states she is exercising walking 30 minutes 4 times per week.   Interval History:  Since last office visit she is down 4 lb This gives her a net weight loss of 8 pounds in the past 1-1/2 years of medically supervised weight management She has been more mindful of portions and food choices Her husband has been more supportive She does feel better She is working on water intake She did travel to [place]  She is walking 30 min 4 x a week She is snacking less at work She did restart Lomaira  8 mg bid 1 month ago with improved satiety Her insurance did not cover use of a GLP-1 receptor agonist She has tolerated the dose increase of metformin  well without problems  Pharmacotherapy: Lomaira  8 mg bid and Metformin  500 mg bid   PHYSICAL EXAM:  Blood pressure 105/66, pulse 60, temperature 97.8 F (36.6 C), height 5\' 10"  (1.778 m), weight (!) 302 lb (137 kg), last menstrual period 03/16/2024, SpO2 100%. Body mass index is 43.33 kg/m.  General: She is overweight, cooperative, alert, well developed, and in no acute distress. PSYCH: Has normal mood, affect and thought process.   Lungs: Normal breathing effort, no conversational dyspnea.  ASSESSMENT AND PLAN  TREATMENT PLAN FOR OBESITY:  Recommended Dietary Goals  Kathryn Huff is currently in the action stage of change. As  such, her goal is to continue weight management plan. She has agreed to keeping a food journal and adhering to recommended goals of 1800 calories and 100 g of  protein.  Behavioral Intervention  We discussed the following Behavioral Modification Strategies today: increasing lean protein intake to established goals, increasing fiber rich foods, increasing water intake , keeping healthy foods at home, identifying sources and decreasing liquid calories, work on managing stress, creating time for self-care and relaxation, avoiding temptations and identifying enticing environmental cues, planning for success, better snacking choices, and continue to work on maintaining a reduced calorie state, getting the recommended amount of protein, incorporating whole foods, making healthy choices, staying well hydrated and practicing mindfulness when eating..  Additional resources provided today: NA  Recommended Physical Activity Goals  Kathryn Huff has been advised to work up to 150 minutes of moderate intensity aerobic activity a week and strengthening exercises 2-3 times per week for cardiovascular health, weight loss maintenance and preservation of muscle mass.   She has agreed to Increase the intensity, frequency or duration of aerobic exercises    Pharmacotherapy changes for the treatment of obesity: None  ASSOCIATED CONDITIONS ADDRESSED TODAY  Vitamin D  deficiency Last vitamin D  Lab Results  Component Value Date   VD25OH 15.4 (L) 01/11/2024  She has been on vitamin D  prescription but taking over-the-counter vitamin D3 at 5000 IU once daily.  Energy level has improved.  Repeat vitamin D  level in  1 to 2 months  Insulin  resistance She has tolerated this increase of metformin  from 250 mg twice daily to 500 mg twice daily without GI side effects.  She is doing better reducing intake of starches sweets.  She has increased her walking time.  Continue to work on healthy lifestyle changes.  Continue metformin  at  500 mg twice daily.  Repeat BMP, B12 and fasting insulin  in 1 to 2 months -     metFORMIN  HCl; Take 1 tablet (500 mg total) by mouth 2 (two) times daily with a meal.  Dispense: 60 tablet; Refill: 1  Class 3 severe obesity due to excess calories with serious comorbidity and body mass index (BMI) of 40.0 to 44.9 in adult (HCC) -     Lomaira ; 1 tab po 30 min before breakfast and 1 tab po 1-2 hrs after lunch daily  Dispense: 28 tablet; Refill: 0 Blood pressure and heart rate are at goal.  Avoid pregnancy while on Lomaira .  Benefit exceeds the risk to continuing Lomaira  for class III obesity  Pre-diabetes Lab Results  Component Value Date   HGBA1C 5.7 (H) 01/11/2024  Continue active plan for weight reduction, reducing intake of added sugar and refined carbohydrates will focusing on lean protein and fiber with meals.  We discussed exercise goal for 30 to 40 minutes 5 days a week.  Repeat A1c in 1 to 2 months  Elevated alkaline phosphatase level Last alk phos mildly elevated.  She denies issues with right upper quadrant pain or history of gall stones.  Repeat alk phos with GGT, hepatic function panel in 1 to 2 months     She was informed of the importance of frequent follow up visits to maximize her success with intensive lifestyle modifications for her multiple health conditions.   ATTESTASTION STATEMENTS:  Reviewed by clinician on day of visit: allergies, medications, problem list, medical history, surgical history, family history, social history, and previous encounter notes pertinent to obesity diagnosis.   I have personally spent 30 minutes total time today in preparation, patient care, nutritional counseling and education,  and documentation for this visit, including the following: review of most recent clinical lab tests, prescribing medications/ refilling medications, reviewing medical assistant documentation, review and interpretation of bioimpedence results.     Micky Albee,  D.O. DABFM, DABOM [facility] [address] [place], [place] [address] [phone]

## 2024-05-07 ENCOUNTER — Encounter: Payer: Self-pay | Admitting: Family Medicine

## 2024-05-07 ENCOUNTER — Ambulatory Visit: Admitting: Family Medicine

## 2024-05-07 VITALS — BP 113/75 | HR 62 | Temp 97.9°F | Ht 70.0 in | Wt 300.0 lb

## 2024-05-07 DIAGNOSIS — Z6841 Body Mass Index (BMI) 40.0 and over, adult: Secondary | ICD-10-CM

## 2024-05-07 DIAGNOSIS — E559 Vitamin D deficiency, unspecified: Secondary | ICD-10-CM | POA: Diagnosis not present

## 2024-05-07 DIAGNOSIS — R632 Polyphagia: Secondary | ICD-10-CM | POA: Diagnosis not present

## 2024-05-07 DIAGNOSIS — R7303 Prediabetes: Secondary | ICD-10-CM | POA: Diagnosis not present

## 2024-05-07 DIAGNOSIS — E88819 Insulin resistance, unspecified: Secondary | ICD-10-CM

## 2024-05-07 DIAGNOSIS — E66813 Obesity, class 3: Secondary | ICD-10-CM

## 2024-05-07 DIAGNOSIS — R748 Abnormal levels of other serum enzymes: Secondary | ICD-10-CM | POA: Diagnosis not present

## 2024-05-07 MED ORDER — METFORMIN HCL 500 MG PO TABS: 500.0000 mg | ORAL_TABLET | Freq: Two times a day (BID) | ORAL | 1 refills | Status: DC

## 2024-05-07 MED ORDER — PHENTERMINE HCL 15 MG PO CAPS: ORAL_CAPSULE | ORAL | 0 refills | Status: DC

## 2024-05-07 NOTE — Progress Notes (Signed)
 Office: [phone]  /  Fax: [phone]  WEIGHT SUMMARY AND BIOMETRICS  Starting Date: 07/27/22  Starting Weight: 310lb   Weight Lost Since Last Visit: 2lb   Vitals Temp: 97.9 F (36.6 C) BP: 113/75 Pulse Rate: 62 SpO2: 98 %   Body Composition  Body Fat %: 45.6 % Fat Mass (lbs): 137 lbs Muscle Mass (lbs): 155.4 lbs Total Body Water (lbs): 99.4 lbs Visceral Fat Rating : 12   HPI  Chief Complaint: OBESITY  Kathryn Huff is here to discuss her progress with her obesity treatment plan. She is on the the Category 4 Plan and states she is following her eating plan approximately 70 % of the time. She states she is exercising 20 minutes 4 times per week.  Interval History:  Since last office visit she is down 2 lb She has been busier at work, carrying protein shakes instead of skipping lunch She has been eating out for dinner more often She has some sugar cravings, slightly better with metformin  and Lomaira  She hasn't had much improved satiety from Lomaira  8 mg bid She is tolerating metformin  well LMP was 27 days ago She has been walking a bit more with friends at work and is doing yoga She has a net weight loss of 10 lb in 1.5 years of medically supervised weight management which is a 3.2% TBW loss  Pharmacotherapy: Lomaira  8 mg bid and metformin  500 mg bid   PHYSICAL EXAM:  Blood pressure 113/75, pulse 62, temperature 97.9 F (36.6 C), height 5\' 10"  (1.778 m), weight 300 lb (136.1 kg), last menstrual period 03/16/2024, SpO2 98%. Body mass index is 43.05 kg/m.  General: She is overweight, cooperative, alert, well developed, and in no acute distress. PSYCH: Has normal mood, affect and thought process.   Lungs: Normal breathing effort, no conversational dyspnea.   ASSESSMENT AND PLAN  TREATMENT PLAN FOR OBESITY:  Recommended Dietary Goals  Kathryn Huff is currently in the action stage of change. As such, her goal is to continue weight management plan. She has agreed  to keeping a food journal and adhering to recommended goals of 1800 calories and 100+ grams of protein.  Behavioral Intervention  We discussed the following Behavioral Modification Strategies today: increasing lean protein intake to established goals, increasing fiber rich foods, increasing water intake , work on tracking and journaling calories using tracking application, reading food labels , keeping healthy foods at home, work on managing stress, creating time for self-care and relaxation, avoiding temptations and identifying enticing environmental cues, planning for success, and continue to work on maintaining a reduced calorie state, getting the recommended amount of protein, incorporating whole foods, making healthy choices, staying well hydrated and practicing mindfulness when eating.. Reviewed lifestyle change goals get her in after visit summary  Additional resources provided today: NA  Recommended Physical Activity Goals  Kathryn Huff has been advised to work up to 150 minutes of moderate intensity aerobic activity a week and strengthening exercises 2-3 times per week for cardiovascular health, weight loss maintenance and preservation of muscle mass.   She has agreed to Increase the intensity, frequency or duration of aerobic exercises    Pharmacotherapy changes for the treatment of obesity: Changed Lomaira  8 mg twice daily to phentermine  15 mg twice daily  ASSOCIATED CONDITIONS ADDRESSED TODAY  Pre-diabetes Lab Results  Component Value Date   HGBA1C 5.7 (H) 01/11/2024   She is tolerating metformin  500 mg twice daily with food without adverse side effects.  She is working on reducing intake  of added sugar and refined carbohydrates with fair results.  She has increased her walking time.  She still has some cravings for sugar has not seen a reduction in appetite with metformin .  Repeat fasting insulin  and A1c next visit.  Class 3 severe obesity due to excess calories with serious  comorbidity and body mass index (BMI) of 40.0 to 44.9 in adult -     Phentermine  HCl; 1 capsule po 30 min before breakfast and 1 capsule po at noon daily  Dispense: 60 capsule; Refill: 0 Due to lack of satiety on Lomaira  over the past month, will change over to phentermine  15 mg capsule twice daily.  Blood pressure and heart rate are within normal limits.  Avoid pregnancy while on phentermine .  She agrees to using barrier method and tracking cycle.  Insulin  resistance -     metFORMIN  HCl; Take 1 tablet (500 mg total) by mouth 2 (two) times daily with a meal.  Dispense: 60 tablet; Refill: 1  Vitamin D  deficiency Last vitamin D  Lab Results  Component Value Date   VD25OH 15.4 (L) 01/11/2024   She is doing well on vitamin D  5000 IU once daily.  Energy level has improved.  Recheck vitamin D  level next visit  Elevated alkaline phosphatase level Last alk phos mildly elevated.  Repeat CMP and GGT next visit. She denies right upper quadrant pain or postprandial nausea or vomiting.  Polyphagia Worsening.  She notes that lack of sleep and high stress have worsened her polyphasia.  She has not seen much improvement in the past month using Lomaira  and metformin .  We discussed avoiding high sugar foods and drinks and ultra processed foods that stimulate appetite and prioritizing lean protein and fiber with meals.  Encouraged 100 ounces of water intake daily and reviewed change goals on after visit summary.  Look for improvement in polyphasia by adjusting protein, fiber, water and increasing phentermine  dose.     She was informed of the importance of frequent follow up visits to maximize her success with intensive lifestyle modifications for her multiple health conditions.   ATTESTASTION STATEMENTS:  Reviewed by clinician on day of visit: allergies, medications, problem list, medical history, surgical history, family history, social history, and previous encounter notes pertinent to obesity  diagnosis.   I have personally spent 31 minutes total time today in preparation, patient care, nutritional counseling and education,  and documentation for this visit, including the following: review of most recent clinical lab tests, prescribing medications/ refilling medications, reviewing medical assistant documentation, review and interpretation of bioimpedence results.     Micky Albee, D.O. DABFM, DABOM [facility] [address] [place], [place] [address] [phone]

## 2024-05-07 NOTE — Patient Instructions (Addendum)
 Try out Smart Sweets or Unreal snacks for an occasional sweet treat with less sugar  Continue to stay mindful  Aim for 30 min of walking daily  Hydrate well with water  Continue metformin  500 mg twice daily  Change Lomaira  to Phentermine  15 mg capsule 2 x a day for appetite control

## 2024-06-05 ENCOUNTER — Ambulatory Visit: Admitting: Family Medicine

## 2024-06-12 ENCOUNTER — Ambulatory Visit (INDEPENDENT_AMBULATORY_CARE_PROVIDER_SITE_OTHER): Admitting: Family

## 2024-06-12 ENCOUNTER — Encounter: Payer: Self-pay | Admitting: Family

## 2024-06-12 ENCOUNTER — Encounter: Admitting: Family

## 2024-06-12 VITALS — BP 112/54 | HR 93 | Temp 98.6°F | Resp 16 | Ht 70.0 in | Wt 309.0 lb

## 2024-06-12 DIAGNOSIS — Z3490 Encounter for supervision of normal pregnancy, unspecified, unspecified trimester: Secondary | ICD-10-CM | POA: Insufficient documentation

## 2024-06-12 DIAGNOSIS — Z0000 Encounter for general adult medical examination without abnormal findings: Secondary | ICD-10-CM

## 2024-06-12 NOTE — Assessment & Plan Note (Signed)
 Newly pregnant, medications adjusted. No complications.  - Discontinued phentermine  and metformin .  - Continue healthy eating habits. - - Maintain regular exercise.  - Keep appointment with gynecologist next month.

## 2024-06-12 NOTE — Patient Instructions (Signed)
 VISIT SUMMARY:  Today, we confirmed your new pregnancy and discussed your current health status. You have discontinued certain medications due to the pregnancy and are maintaining good physical activity and nutrition habits. Your celiac disease is stable, and your general health is good.  YOUR PLAN:  PREGNANCY: You are newly pregnant and have adjusted your medications accordingly. There are no complications at this time. -Discontinue phentermine  and metformin . -Continue healthy eating habits. -Maintain regular exercise. -Keep your appointment with the gynecologist next month.  CELIAC DISEASE: Your celiac disease is stable with no new digestive concerns. -Continue to follow your gluten-free diet.  GENERAL HEALTH MAINTENANCE: Your vaccinations are current, and you are maintaining regular physical activity and healthy eating habits. -Recommend getting a flu shot in the fall. -Continue regular exercise. -Maintain healthy eating habits.

## 2024-06-12 NOTE — Progress Notes (Signed)
 Subjective:     Patient ID: Kathryn Huff, female    DOB: January 02, 1991, 33 y.o.   MRN: [id number]  Chief Complaint  Patient presents with   Annual Exam    HPI  Discussed the use of AI scribe software for clinical note transcription with the patient, who gave verbal consent to proceed.  History of Present Illness Kathryn Huff is a 33 year old female who presents with a new pregnancy.   Pregnancy status - New pregnancy confirmed  - Last menstrual period in late May 2025  - Discontinued phentermine  and metformin  two weeks ago due to pregnancy  - Upcoming appointment with new gynecologist Physical activity and nutrition - Engages in regular physical activity, walking 20 to 30 minutes most days  - Improved eating habits   - History of celiac disease - Recent hemoglobin A1c of 5.7 General review of systems  - No cough or cold symptoms  - No hearing or vision concerns  - No skin issues  - No leg swelling  - No digestive concerns  - No urinary issues  - No unusual muscle or joint pain  - No frequent headaches  - No mental health concerns    Immunizations: tetanus 2020 Diet:  healthy Exercise: walks with her children 20-30 every other day Pap Smear: 5/23- neg per gyn, has appointment next month with OB/GYN in [place] Vision/Dental: up to date       Health Maintenance Due  Topic Date Due   Hepatitis C Screening  Never done   COVID-19 Vaccine (3 - 2024-25 season) 08/20/2023    Past Medical History:  Diagnosis Date   ADD (attention deficit disorder)    ADHD    Anxiety    Back pain    Celiac disease 12/22/2017   Chest pain    Constipation    Depression    Fatty liver    Heart murmur    Liver function abnormality    HIGH   Migraines    Obesity    Prediabetes    Vitamin D  deficiency     Past Surgical History:  Procedure Laterality Date   CESAREAN SECTION     CESAREAN SECTION  03/25/2021   MOUTH SURGERY     two teeth surgically removed.       Family History  Problem Relation Age of Onset   Factor V Leiden deficiency Mother    Obesity Mother    Heart disease Mother    Diabetes Father    Hyperlipidemia Father    Sleep apnea Father    Obesity Father    Diabetes Sister 5       Type 1   Celiac disease Sister    Heart Problems Sister        left ventricular non-compaction   Diabetes Mellitus II Maternal Grandmother    Factor V Leiden deficiency Maternal Grandmother    Diabetes Mellitus II Maternal Grandfather    Cancer Paternal Grandmother        breast   Breast cancer Paternal Grandmother    Cancer Paternal Grandfather        throat/throat and lung, smoker   Esophageal cancer Paternal Grandfather    Colon cancer Neg Hx    Colon polyps Neg Hx    Stomach cancer Neg Hx    Rectal cancer Neg Hx     Social History   Socioeconomic History   Marital status: Married    Spouse name: Kathryn Huff   Number of  children: 2   Years of education: Not on file   Highest education level: Not on file  Occupational History   Occupation: Engineer, structural & Admission Associate    Employer: [employer]  Tobacco Use   Smoking status: Former    Types: Cigarettes   Smokeless tobacco: Never  Vaping Use   Vaping status: Never Used  Substance and Sexual Activity   Alcohol use: Not Currently    Alcohol/week: 1.0 standard drink of alcohol    Types: 1 Glasses of wine per week    Comment: occasional   Drug use: No   Sexual activity: Yes    Birth control/protection: Other-see comments  Other Topics Concern   Not on file  Social History Narrative   Lives with husband   Has one sister and one brother   Dentist at [facility]   Enjoys spending time with her sister   One dog- huskey/shepherd mix.     2 children (one son and one daughter)   Social Drivers of Corporate investment banker Strain: Low Risk  (03/25/2021)   Received from [facility]   Overall Financial Resource Strain (CARDIA)    Difficulty of Paying Living  Expenses: Not hard at all  Food Insecurity: No Food Insecurity (04/20/2022)   [facility]   Hunger Vital Sign    Within the past 12 months, you worried that your food would run out before you got the money to buy more.: Never true    Within the past 12 months, the food you bought just didn't last and you didn't have money to get more.: Never true  Transportation Needs: Not on file  Physical Activity: Inactive (04/06/2022)   Exercise Vital Sign    Days of Exercise per Week: 0 days    Minutes of Exercise per Session: 0 min  Stress: No Stress Concern Present (03/24/2021)   Received from [facility] - Occupational Stress Questionnaire    Feeling of Stress : Not at all  Social Connections: Unknown (04/17/2022)   Received from [facility]   Social Network    Social Network: Not on file  Intimate Partner Violence: Unknown (03/22/2022)   Received from [facility]   HITS    Physically Hurt: Not on file    Insult or Talk Down To: Not on file    Threaten Physical Harm: Not on file    Scream or Curse: Not on file    Outpatient Medications Prior to Visit  Medication Sig Dispense Refill   phentermine  15 MG capsule 1 capsule po 30 min before breakfast and 1 capsule po at noon daily 60 capsule 0   acetaminophen  (TYLENOL ) 500 MG tablet Take 500 mg by mouth every 6 (six) hours as needed for mild pain. (Patient not taking: Reported on 06/12/2024)     metFORMIN  (GLUCOPHAGE ) 500 MG tablet Take 1 tablet (500 mg total) by mouth 2 (two) times daily with a meal. (Patient not taking: Reported on 06/12/2024) 60 tablet 1   No facility-administered medications prior to visit.    Allergies  Allergen Reactions   Gluten Meal Other (See Comments) and Diarrhea    Review of Systems  Constitutional:  Negative for weight loss.  HENT:  Negative for congestion and hearing loss.   Eyes:  Negative for blurred vision.  Respiratory:  Negative for cough.    Cardiovascular:  Negative for leg swelling.  Gastrointestinal:  Negative for constipation  and diarrhea.  Genitourinary:  Negative for dysuria and frequency.  Musculoskeletal:  Negative for joint pain and myalgias.  Skin:  Negative for rash.  Neurological:  Negative for headaches.  Psychiatric/Behavioral:  Negative for depression. The patient is not nervous/anxious.        Objective:    Physical Exam   BP (!) 112/54 (BP Location: Right Arm, Patient Position: Sitting, Cuff Size: Large)   Pulse 93   Temp 98.6 F (37 C) (Oral)   Resp 16   Ht 5' 10 (1.778 m)   Wt (!) 309 lb (140.2 kg)   SpO2 98%   BMI 44.34 kg/m  Wt Readings from Last 3 Encounters:  06/12/24 (!) 309 lb (140.2 kg)  05/07/24 300 lb (136.1 kg)  04/09/24 (!) 302 lb (137 kg)  Physical Exam   Constitutional: She is oriented to person, place, and time. She appears well-developed and well-nourished. No distress.  HENT:  Head: Normocephalic and atraumatic.  Right Ear: Tympanic membrane and ear canal normal.  Left Ear: Tympanic membrane and ear canal normal.  Mouth/Throat: Oropharynx is clear and moist.  Eyes: Pupils are equal, round, and reactive to light. No scleral icterus.  Neck: Normal range of motion. No thyromegaly present.  Cardiovascular: Normal rate and regular rhythm.   No murmur heard. Pulmonary/Chest: Effort normal and breath sounds normal. No respiratory distress. He has no wheezes. She has no rales. She exhibits no tenderness.  Abdominal: Soft. Bowel sounds are normal. She exhibits no distension and no mass. There is no tenderness. There is no rebound and no guarding.  Musculoskeletal: She exhibits no edema.  Lymphadenopathy:    She has no cervical adenopathy.  Neurological: She is alert and oriented to person, place, and time. She has normal patellar reflexes. She exhibits normal muscle tone. Coordination normal.  Skin: Skin is warm and dry.  Psychiatric: She has a normal mood and affect. Her  behavior is normal. Judgment and thought content normal.  Breast/pelvic: deferred         Assessment & Plan:        Assessment & Plan:   Problem List Items Addressed This Visit       Unprioritized   Preventative health care - Primary    Vaccinations current, regular physical activity maintained.  - Recommend flu shot in the fall.  - Continue regular exercise.  - Maintain healthy eating habits.       Early stage of pregnancy   Newly pregnant, medications adjusted. No complications.  - Discontinued phentermine  and metformin .  - Continue healthy eating habits. - - Maintain regular exercise.  - Keep appointment with gynecologist next month.        I am having Kathryn Huff maintain her acetaminophen , metFORMIN , and phentermine .  No orders of the defined types were placed in this encounter.

## 2024-06-12 NOTE — Assessment & Plan Note (Signed)
  Vaccinations current, regular physical activity maintained.  - Recommend flu shot in the fall.  - Continue regular exercise.  - Maintain healthy eating habits.

## 2024-06-26 ENCOUNTER — Ambulatory Visit: Admitting: Family Medicine

## 2024-07-03 ENCOUNTER — Encounter: Payer: Self-pay | Admitting: Family

## 2024-07-03 ENCOUNTER — Other Ambulatory Visit (HOSPITAL_COMMUNITY)
Admission: RE | Admit: 2024-07-03 | Discharge: 2024-07-03 | Disposition: A | Discharge status: Home / Self Care | Source: Ambulatory Visit | Attending: Family Medicine | Admitting: Family Medicine

## 2024-07-03 ENCOUNTER — Ambulatory Visit

## 2024-07-03 VITALS — BP 118/48 | HR 74 | Ht 70.0 in | Wt 303.0 lb

## 2024-07-03 DIAGNOSIS — N898 Other specified noninflammatory disorders of vagina: Secondary | ICD-10-CM | POA: Insufficient documentation

## 2024-07-03 LAB — CERVICOVAGINAL ANCILLARY ONLY
Bacterial Vaginitis (gardnerella): POSITIVE — AB
Candida Glabrata: NEGATIVE
Candida Vaginitis: POSITIVE — AB
Chlamydia: NEGATIVE
Comment: NEGATIVE
Comment: NEGATIVE
Comment: NEGATIVE
Comment: NEGATIVE
Comment: NEGATIVE
Comment: NORMAL
Neisseria Gonorrhea: NEGATIVE
Trichomonas: NEGATIVE

## 2024-07-03 NOTE — Progress Notes (Signed)
 SUBJECTIVE:  33 y.o. female complains of white vaginal discharge for 2 day(s). Denies abnormal vaginal bleeding or significant pelvic pain or fever.Denies history of known exposure to STD.  Patient's last menstrual period was 05/12/2024 (approximate).  OBJECTIVE:  She appears alert, well appearing, in no apparent distress Urine dipstick: not done.  ASSESSMENT:  Vaginal Discharge     PLAN:  GC, chlamydia, trichomonas, BVAG, CVAG probe sent to lab. Treatment: To be determined once lab results are received ROV prn if symptoms persist or worsen.

## 2024-07-03 NOTE — Telephone Encounter (Signed)
 Called patient to get information on her symptoms and she reports she was able to contact gyn and will see them today

## 2024-07-04 ENCOUNTER — Ambulatory Visit: Payer: Self-pay | Admitting: Family Medicine

## 2024-07-04 ENCOUNTER — Other Ambulatory Visit: Payer: Self-pay | Admitting: Family Medicine

## 2024-07-04 MED ORDER — TERCONAZOLE 0.4 % VA CREA: 1.0000 | TOPICAL_CREAM | Freq: Every day | VAGINAL | 0 refills | Status: DC

## 2024-07-04 MED ORDER — METRONIDAZOLE 500 MG PO TABS: 500.0000 mg | ORAL_TABLET | Freq: Two times a day (BID) | ORAL | 0 refills | Status: DC

## 2024-07-04 MED ORDER — FLUCONAZOLE 150 MG PO TABS: 150.0000 mg | ORAL_TABLET | Freq: Once | ORAL | 0 refills | Status: DC

## 2024-07-06 ENCOUNTER — Other Ambulatory Visit: Payer: Self-pay | Admitting: Family Medicine

## 2024-07-10 ENCOUNTER — Encounter

## 2024-07-11 ENCOUNTER — Other Ambulatory Visit: Payer: Self-pay

## 2024-07-11 ENCOUNTER — Ambulatory Visit

## 2024-07-11 VITALS — BP 114/78 | HR 86 | Wt 303.0 lb

## 2024-07-11 DIAGNOSIS — O0990 Supervision of high risk pregnancy, unspecified, unspecified trimester: Secondary | ICD-10-CM

## 2024-07-11 DIAGNOSIS — T8859XA Other complications of anesthesia, initial encounter: Secondary | ICD-10-CM | POA: Insufficient documentation

## 2024-07-11 LAB — POCT URINE PREGNANCY: Preg Test, Ur: POSITIVE — AB

## 2024-07-11 NOTE — Progress Notes (Signed)
 New OB Intake  I explained I am completing New OB Intake today. We discussed EDD of 02/16/2025, by Last Menstrual Period. Pt is [id number]. I reviewed her allergies, medications and Medical/Surgical/OB history.    Patient Active Problem List   Diagnosis Date Noted   Supervision of high risk pregnancy, antepartum 07/11/2024   Complication of anesthesia    Early stage of pregnancy 06/12/2024   Elevated alkaline phosphatase level 02/14/2024   BMI 40.0-44.9, adult (HCC) 05/31/2023   GAD (generalized anxiety disorder) 11/30/2022   Anxiety 10/25/2022   Polyphagia 10/25/2022   Other fatigue 07/27/2022   SOBOE (shortness of breath on exertion) 07/27/2022   Pre-diabetes 07/27/2022   Class 3 severe obesity due to excess calories with body mass index (BMI) of 40.0 to 44.9 in adult 07/27/2022   Celiac disease 12/22/2017   Vitamin D  deficiency 08/17/2017   Insulin  resistance 07/27/2017   Depression 07/27/2017   Morbid obesity (HCC) with starting BMI 44 03/14/2017   Hypertriglyceridemia 09/10/2014   Preventative health care 05/30/2014   ADHD (attention deficit hyperactivity disorder) 05/30/2014   Migraine 05/30/2014    Concerns addressed today  Patient informed that the ultrasound is considered a limited obstetric ultrasound and is not intended to be a complete ultrasound exam.  Patient also informed that the ultrasound is not being completed with the intent of assessing for fetal or placental anomalies or any pelvic abnormalities. Explained that the purpose of today's ultrasound is to assess for viability.  Patient acknowledges the purpose of the exam and the limitations of the study.     Delivery Plans Plans to deliver at [facility]. Discussed the nature of our practice with multiple providers including residents and students. Due to the size of the practice, the delivering provider may not be the same as those providing prenatal care.   MyChart/Babyscripts MyChart access verified. I explained pt  will have some visits in office and some virtually. Babyscripts app discussed and ordered.   Blood Pressure Cuff Blood pressure cuff discussedDiscussed to be used for virtual visits and or if needed BP checks weekly.  Anatomy US  Explained first scheduled US  will be around 19 weeks.   Last Pap No results found for: DIAGPAP  First visit review I reviewed new OB appt with patient. Explained pt will be seen by Dr. Barbra at first visit. Discussed [place] genetic screening with patient. Routine prenatal labs ordered.    Erminio DELENA Rumps, [place] 07/11/2024  2:46 PM

## 2024-07-12 ENCOUNTER — Ambulatory Visit: Payer: Self-pay | Admitting: Obstetrics and Gynecology

## 2024-07-12 LAB — CBC/D/PLT+RPR+RH+ABO+RUBIGG...
Antibody Screen: NEGATIVE
Basophils Absolute: 0 x10E3/uL (ref 0.0–0.2)
Basos: 0 %
EOS (ABSOLUTE): 0.1 x10E3/uL (ref 0.0–0.4)
Eos: 1 %
HCV Ab: NONREACTIVE
HIV Screen 4th Generation wRfx: NONREACTIVE
Hematocrit: 41.7 % (ref 34.0–46.6)
Hemoglobin: 13.3 g/dL (ref 11.1–15.9)
Hepatitis B Surface Ag: NEGATIVE
Immature Grans (Abs): 0 x10E3/uL (ref 0.0–0.1)
Immature Granulocytes: 0 %
Lymphocytes Absolute: 1.5 x10E3/uL (ref 0.7–3.1)
Lymphs: 18 %
MCH: 27.8 pg (ref 26.6–33.0)
MCHC: 31.9 g/dL (ref 31.5–35.7)
MCV: 87 fL (ref 79–97)
Monocytes Absolute: 0.6 x10E3/uL (ref 0.1–0.9)
Monocytes: 7 %
Neutrophils Absolute: 6.3 x10E3/uL (ref 1.4–7.0)
Neutrophils: 74 %
Platelets: 239 x10E3/uL (ref 150–450)
RBC: 4.78 x10E6/uL (ref 3.77–5.28)
RDW: 13.4 % (ref 11.7–15.4)
RPR Ser Ql: NONREACTIVE
Rh Factor: POSITIVE
Rubella Antibodies, IGG: 1.57 {index} (ref >0.99)
WBC: 8.5 x10E3/uL (ref 3.4–10.8)

## 2024-07-12 LAB — HCV INTERPRETATION

## 2024-07-13 LAB — CULTURE, OB URINE

## 2024-07-13 LAB — URINE CULTURE, OB REFLEX

## 2024-07-24 ENCOUNTER — Other Ambulatory Visit: Payer: Self-pay

## 2024-07-24 ENCOUNTER — Other Ambulatory Visit

## 2024-07-24 DIAGNOSIS — Z3A10 10 weeks gestation of pregnancy: Secondary | ICD-10-CM | POA: Diagnosis not present

## 2024-07-24 DIAGNOSIS — O0991 Supervision of high risk pregnancy, unspecified, first trimester: Secondary | ICD-10-CM

## 2024-07-24 DIAGNOSIS — O0990 Supervision of high risk pregnancy, unspecified, unspecified trimester: Secondary | ICD-10-CM

## 2024-07-31 ENCOUNTER — Encounter (HOSPITAL_COMMUNITY): Payer: Self-pay | Admitting: *Deleted

## 2024-07-31 ENCOUNTER — Ambulatory Visit (HOSPITAL_COMMUNITY)
Admission: EM | Admit: 2024-07-31 | Discharge: 2024-07-31 | Disposition: A | Discharge status: Home / Self Care | Attending: Family Medicine | Admitting: Family Medicine

## 2024-07-31 ENCOUNTER — Encounter: Payer: Self-pay | Admitting: Family Medicine

## 2024-07-31 ENCOUNTER — Other Ambulatory Visit: Payer: Self-pay

## 2024-07-31 DIAGNOSIS — Z3A11 11 weeks gestation of pregnancy: Secondary | ICD-10-CM | POA: Diagnosis not present

## 2024-07-31 DIAGNOSIS — U071 COVID-19: Secondary | ICD-10-CM

## 2024-07-31 LAB — POC SARS CORONAVIRUS 2 AG -  ED: SARS Coronavirus 2 Ag: POSITIVE — AB

## 2024-07-31 NOTE — ED Provider Notes (Signed)
 [facility]    CSN: [id number] Arrival date & time: 07/31/24  0913      History   Chief Complaint Chief Complaint  Patient presents with   Fatigue   Rhinorrhea    HPI Kathryn Huff is a 33 y.o. female.   Patient presents today with a 24-hour history of URI symptoms.  She reports sneezing, congestion, rhinorrhea, sore throat.  She has not experienced much cough and denies any fever, chest pain, shortness of breath.  She has had some nausea and vomiting but this is at baseline as she is currently [redacted] weeks pregnant.  She took Tylenol  earlier today which provided some improvement of symptoms.  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  Denies any recent antibiotics.  She has had COVID with last episode several years ago.    Past Medical History:  Diagnosis Date   ADD (attention deficit disorder)    ADHD 2002   Anxiety    Back pain    Celiac disease 12/22/2017   Chest pain    Complication of anesthesia    Constipation    Depression    Fatty liver    Heart murmur    Liver function abnormality    HIGH   Migraines    Obesity    Prediabetes    Vitamin D  deficiency 2017    Patient Active Problem List   Diagnosis Date Noted   Supervision of high risk pregnancy, antepartum 07/11/2024   Complication of anesthesia    Early stage of pregnancy 06/12/2024   Elevated alkaline phosphatase level 02/14/2024   BMI 40.0-44.9, adult (HCC) 05/31/2023   GAD (generalized anxiety disorder) 11/30/2022   Anxiety 10/25/2022   Polyphagia 10/25/2022   Other fatigue 07/27/2022   SOBOE (shortness of breath on exertion) 07/27/2022   Pre-diabetes 07/27/2022   Class 3 severe obesity due to excess calories with body mass index (BMI) of 40.0 to 44.9 in adult 07/27/2022   Celiac disease 12/22/2017   Vitamin D  deficiency 08/17/2017   Insulin   resistance 07/27/2017   Depression 07/27/2017   Morbid obesity (HCC) with starting BMI 44 03/14/2017   Hypertriglyceridemia 09/10/2014   Preventative health care 05/30/2014   ADHD (attention deficit hyperactivity disorder) 05/30/2014   Migraine 05/30/2014    Past Surgical History:  Procedure Laterality Date   CESAREAN SECTION  2020   CESAREAN SECTION  03/25/2021   MOUTH SURGERY  2012   two teeth surgically removed.      OB History     Gravida  4   Para  2   Term  2   Preterm  0   AB  1   Living  2      SAB  1   IAB      Ectopic      Multiple      Live Births  2            Home Medications    Prior to Admission medications   Medication Sig Start Date End Date Taking? Authorizing Provider  acetaminophen  (TYLENOL ) 500 MG tablet Take 500 mg by mouth every 6 (six) hours as needed for mild pain.   Yes [provider]    Family History Family History  Problem Relation Age of Onset   Factor V Leiden deficiency Mother    Obesity Mother  Heart disease Mother    Diabetes Father    Hyperlipidemia Father    Sleep apnea Father    Obesity Father    Diabetes Sister 5       Type 1   Celiac disease Sister    Heart Problems Sister        left ventricular non-compaction   Diabetes Mellitus II Maternal Grandmother    Factor V Leiden deficiency Maternal Grandmother    Diabetes Mellitus II Maternal Grandfather    Cancer Paternal Grandmother        breast   Breast cancer Paternal Grandmother    Cancer Paternal Grandfather        throat/throat and lung, smoker   Esophageal cancer Paternal Grandfather    Colon cancer Neg Hx    Colon polyps Neg Hx    Stomach cancer Neg Hx    Rectal cancer Neg Hx     Social History Social History   Tobacco Use   Smoking status: Former    Types: Cigarettes   Smokeless tobacco: Never  Vaping Use   Vaping status: Never Used  Substance Use Topics   Alcohol use: Not Currently   Drug use: No     Allergies    Gluten meal   Review of Systems Review of Systems  Constitutional:  Positive for activity change and fatigue. Negative for appetite change and fever.  HENT:  Positive for congestion, sneezing and sore throat. Negative for sinus pressure.   Respiratory:  Negative for cough and shortness of breath.   Cardiovascular:  Negative for chest pain.  Gastrointestinal:  Negative for diarrhea, nausea (at baseline for pregnancy) and vomiting (at baseline for pregnancy).  Neurological:  Negative for dizziness, light-headedness and headaches.     Physical Exam Triage Vital Signs ED Triage Vitals  Encounter Vitals Group     BP --      Girls Systolic BP Percentile --      Girls Diastolic BP Percentile --      Boys Systolic BP Percentile --      Boys Diastolic BP Percentile --      Pulse --      Resp --      Temp 07/31/24 0917 97.7 F (36.5 C)     Temp Source 07/31/24 0917 Oral     SpO2 --      Weight --      Height --      Head Circumference --      Peak Flow --      Pain Score 07/31/24 0918 0     Pain Loc --      Pain Education --      Exclude from Growth Chart --    No data found.  Updated Vital Signs BP 117/83   Pulse 89   Temp 97.7 F (36.5 C) (Oral)   LMP 05/12/2024 (Approximate)   SpO2 98%   Visual Acuity Right Eye Distance:   Left Eye Distance:   Bilateral Distance:    Right Eye Near:   Left Eye Near:    Bilateral Near:     Physical Exam Vitals reviewed.  Constitutional:      General: She is awake. She is not in acute distress.    Appearance: Normal appearance. She is well-developed. She is not ill-appearing.     Comments: Very pleasant female appears stated age in no acute distress sitting comfortably in exam room  HENT:     Head: Normocephalic and atraumatic.  Right Ear: Tympanic membrane, ear canal and external ear normal. Tympanic membrane is not erythematous or bulging.     Left Ear: Tympanic membrane, ear canal and external ear normal. Tympanic  membrane is not erythematous or bulging.     Nose:     Right Sinus: No maxillary sinus tenderness or frontal sinus tenderness.     Left Sinus: No maxillary sinus tenderness or frontal sinus tenderness.     Mouth/Throat:     Pharynx: Uvula midline. No oropharyngeal exudate or posterior oropharyngeal erythema.  Cardiovascular:     Rate and Rhythm: Normal rate and regular rhythm.     Heart sounds: Normal heart sounds, S1 normal and S2 normal. No murmur heard. Pulmonary:     Effort: Pulmonary effort is normal.     Breath sounds: Normal breath sounds. No wheezing, rhonchi or rales.     Comments: Clear to auscultation bilaterally Psychiatric:        Behavior: Behavior is cooperative.      UC Treatments / Results  Labs (all labs ordered are listed, but only abnormal results are displayed) Labs Reviewed  POC SARS CORONAVIRUS 2 AG -  ED - Abnormal; Notable for the following components:      Result Value   SARS Coronavirus 2 Ag Positive (*)    All other components within normal limits    EKG   Radiology No results found.  Procedures Procedures (including critical care time)  Medications Ordered in UC Medications - No data to display  Initial Impression / Assessment and Plan / UC Course  I have reviewed the triage vital signs and the nursing notes.  Pertinent labs & imaging results that were available during my care of the patient were reviewed by me and considered in my medical decision making (see chart for details).     Patient is well-appearing, afebrile, nontoxic, nontachycardic.  She does a positive for COVID-19 in clinic today.  We did discuss potential utility of Paxlovid given her pregnancy is a risk factor for severe COVID, however, she reports her symptoms are relatively mild and declined initiation of this medication.  She was encouraged to treat symptomatically with over-the-counter medications that are known to be safe in pregnancy such as Tylenol , nasal  saline/sinus rinses, and humidifier in her room to help with the cough.  Discussed that if her symptoms worsen in any way she needs to be seen immediately.  Strict return precautions given.  All questions answered patient satisfaction.  Final Clinical Impressions(s) / UC Diagnoses   Final diagnoses:  COVID-19  [redacted] weeks gestation of pregnancy     Discharge Instructions      You tested positive for COVID.  In theory, we can start Paxlovid within 5 days of your symptom onset so if this is something you are interested in starting let me know.  Use over-the-counter medications to help manage your symptoms including Tylenol , Mucinex, nasal saline/sinus rinses for additional symptom relief.  Refer to the handout about what medications are safe in pregnancy.  Make sure you rest and drink plenty of fluid.  If anything worsens and you have fever, shortness of breath, chest pain, nausea/vomiting increased from baseline or interfering with oral intake you need to be seen immediately.     ED Prescriptions   None    PDMP not reviewed this encounter.   Sherrell Rocky POUR, PA-C 07/31/24 [id number]

## 2024-07-31 NOTE — Discharge Instructions (Signed)
 You tested positive for COVID.  In theory, we can start Paxlovid within 5 days of your symptom onset so if this is something you are interested in starting let me know.  Use over-the-counter medications to help manage your symptoms including Tylenol , Mucinex, nasal saline/sinus rinses for additional symptom relief.  Refer to the handout about what medications are safe in pregnancy.  Make sure you rest and drink plenty of fluid.  If anything worsens and you have fever, shortness of breath, chest pain, nausea/vomiting increased from baseline or interfering with oral intake you need to be seen immediately.

## 2024-07-31 NOTE — ED Triage Notes (Signed)
 C/O fatigue, rhinorrhea over past day. Reports child currently having respiratory sxs. Pt is [redacted] wks pregnant.

## 2024-08-08 ENCOUNTER — Ambulatory Visit (INDEPENDENT_AMBULATORY_CARE_PROVIDER_SITE_OTHER): Admitting: Family Medicine

## 2024-08-08 VITALS — BP 110/62 | HR 88 | Wt 301.1 lb

## 2024-08-08 DIAGNOSIS — Z6841 Body Mass Index (BMI) 40.0 and over, adult: Secondary | ICD-10-CM

## 2024-08-08 DIAGNOSIS — Z3A12 12 weeks gestation of pregnancy: Secondary | ICD-10-CM

## 2024-08-08 DIAGNOSIS — O0990 Supervision of high risk pregnancy, unspecified, unspecified trimester: Secondary | ICD-10-CM | POA: Diagnosis not present

## 2024-08-08 DIAGNOSIS — K900 Celiac disease: Secondary | ICD-10-CM

## 2024-08-08 DIAGNOSIS — R7303 Prediabetes: Secondary | ICD-10-CM

## 2024-08-08 DIAGNOSIS — Z98891 History of uterine scar from previous surgery: Secondary | ICD-10-CM

## 2024-08-08 DIAGNOSIS — Z1332 Encounter for screening for maternal depression: Secondary | ICD-10-CM | POA: Diagnosis not present

## 2024-08-09 ENCOUNTER — Ambulatory Visit: Payer: Self-pay | Admitting: Family Medicine

## 2024-08-09 ENCOUNTER — Telehealth: Payer: Self-pay

## 2024-08-09 DIAGNOSIS — Z98891 History of uterine scar from previous surgery: Secondary | ICD-10-CM | POA: Insufficient documentation

## 2024-08-09 DIAGNOSIS — O0990 Supervision of high risk pregnancy, unspecified, unspecified trimester: Secondary | ICD-10-CM

## 2024-08-09 LAB — HEMOGLOBIN A1C
Est. average glucose Bld gHb Est-mCnc: 114 mg/dL
Hgb A1c MFr Bld: 5.6 % (ref 4.8–5.6)

## 2024-08-09 MED ORDER — ASPIRIN 81 MG PO TBEC: 81.0000 mg | DELAYED_RELEASE_TABLET | Freq: Every day | ORAL | 2 refills | Status: AC

## 2024-08-09 NOTE — Progress Notes (Signed)
 Subjective:  Kathryn Huff is a [id number] [redacted]w[redacted]d being seen today for her first obstetrical visit.  Her obstetrical history is significant for 2 prior cesarean section. First for NRFHT. Second was repeat. She had wanted to [facility], but the group she was with told her she had a 5% chance of success. Patient does intend to breast feed. Pregnancy history fully reviewed.  Patient reports nausea.  BP 110/62   Pulse 88   Wt (!) 301 lb 1.3 oz (136.6 kg)   LMP 05/12/2024 (Approximate)   BMI 43.20 kg/m   HISTORY: OB History  Gravida Para Term Preterm AB Living  4 2 2  0 1 2  SAB IAB Ectopic Multiple Live Births  1    2    # Outcome Date GA Lbr Len/2nd Weight Sex Type Anes PTL Lv  4 Current           3 Term 03/25/21 [redacted]w[redacted]d  8 lb 3 oz (3.714 kg) F CS-LTranv Spinal N LIV  2 Term 06/14/19 [redacted]w[redacted]d  8 lb 1 oz (3.657 kg) M CS-LTranv EPI N LIV  1 SAB             Past Medical History:  Diagnosis Date   ADD (attention deficit disorder)    ADHD 2002   Anxiety    Back pain    Celiac disease 12/22/2017   Chest pain    Complication of anesthesia    Constipation    Depression    Fatty liver    Heart murmur    Liver function abnormality    HIGH   Migraines    Obesity    Prediabetes    Vitamin D  deficiency 2017    Past Surgical History:  Procedure Laterality Date   CESAREAN SECTION  2020   CESAREAN SECTION  03/25/2021   MOUTH SURGERY  2012   two teeth surgically removed.      Family History  Problem Relation Age of Onset   Factor V Leiden deficiency Mother    Obesity Mother    Heart disease Mother    Diabetes Father    Hyperlipidemia Father    Sleep apnea Father    Obesity Father    Diabetes Sister 5       Type 1   Celiac disease Sister    Heart Problems Sister        left ventricular non-compaction   Diabetes Mellitus II Maternal Grandmother    Factor V Leiden deficiency Maternal Grandmother    Diabetes Mellitus II Maternal Grandfather    Cancer Paternal Grandmother         breast   Breast cancer Paternal Grandmother    Cancer Paternal Grandfather        throat/throat and lung, smoker   Esophageal cancer Paternal Grandfather    Colon cancer Neg Hx    Colon polyps Neg Hx    Stomach cancer Neg Hx    Rectal cancer Neg Hx      Exam  BP 110/62   Pulse 88   Wt (!) 301 lb 1.3 oz (136.6 kg)   LMP 05/12/2024 (Approximate)   BMI 43.20 kg/m   Chaperone present during exam  CONSTITUTIONAL: Well-developed, well-nourished female in no acute distress.  HENT:  Normocephalic, atraumatic, External right and left ear normal. Oropharynx is clear and moist EYES: Conjunctivae and EOM are normal. Pupils are equal, round, and reactive to light. No scleral icterus.  NECK: Normal range of motion, supple, no masses.  Normal thyroid .  CARDIOVASCULAR: Normal heart rate noted, regular rhythm RESPIRATORY: Clear to auscultation bilaterally. Effort and breath sounds normal, no problems with respiration noted. BREASTS: deferred ABDOMEN: Soft, normal bowel sounds, no distention noted.  No tenderness, rebound or guarding.  PELVIC: declined MUSCULOSKELETAL: Normal range of motion. No tenderness.  No cyanosis, clubbing, or edema.  2+ distal pulses. SKIN: Skin is warm and dry. No rash noted. Not diaphoretic. No erythema. No pallor. NEUROLOGIC: Alert and oriented to person, place, and time. Normal reflexes, muscle tone coordination. No cranial nerve deficit noted. PSYCHIATRIC: Normal mood and affect. Normal behavior. Normal judgment and thought content.    Assessment:    Pregnancy: [id number] Patient Active Problem List   Diagnosis Date Noted   Supervision of high risk pregnancy, antepartum 07/11/2024   Complication of anesthesia    Early stage of pregnancy 06/12/2024   Elevated alkaline phosphatase level 02/14/2024   BMI 40.0-44.9, adult (HCC) 05/31/2023   GAD (generalized anxiety disorder) 11/30/2022   Anxiety 10/25/2022   Polyphagia 10/25/2022   Other fatigue 07/27/2022    SOBOE (shortness of breath on exertion) 07/27/2022   Pre-diabetes 07/27/2022   Class 3 severe obesity due to excess calories with body mass index (BMI) of 40.0 to 44.9 in adult 07/27/2022   Celiac disease 12/22/2017   Vitamin D  deficiency 08/17/2017   Insulin  resistance 07/27/2017   Depression 07/27/2017   Morbid obesity (HCC) with starting BMI 44 03/14/2017   Hypertriglyceridemia 09/10/2014   Preventative health care 05/30/2014   ADHD (attention deficit hyperactivity disorder) 05/30/2014   Migraine 05/30/2014      Plan:   1. [redacted] weeks gestation of pregnancy (Primary) - Hemoglobin A1c - US  MFM OB DETAIL +14 WK; Future  2. Supervision of high risk pregnancy, antepartum FHT normal - PANORAMA PRENATAL TEST - HORIZON Basic Panel - Comp Met (CMET)  3. BMI 40.0-44.9, adult (HCC) HgA1c. Will likely need early 2hr GTT  4. Morbid obesity (HCC) with starting BMI 44 - Comp Met (CMET)  5. Celiac disease Well controlled  6. Pre-diabetes Check HgA1c  7. History of 2 cesarean sections Desires TOLAC if spontaneous labor.    Initial labs obtained Continue prenatal vitamins Reviewed n/v relief measures and warning s/s to report Reviewed recommended weight gain based on pre-gravid BMI Encouraged well-balanced diet Genetic & carrier screening discussed: requests Panorama and Horizon ,    Problem list reviewed and updated. 75% of 30 min visit spent on counseling and coordination of care.     Oline Belk J Olaf Mesa 08/09/2024

## 2024-08-15 LAB — PANORAMA PRENATAL TEST FULL PANEL:PANORAMA TEST PLUS 5 ADDITIONAL MICRODELETIONS: FETAL FRACTION: 2.2

## 2024-08-16 ENCOUNTER — Other Ambulatory Visit

## 2024-08-16 DIAGNOSIS — Z3A13 13 weeks gestation of pregnancy: Secondary | ICD-10-CM

## 2024-08-17 LAB — GLUCOSE TOLERANCE, 2 HOURS W/ 1HR
Glucose, 1 hour: 130 mg/dL (ref 70–179)
Glucose, 2 hour: 115 mg/dL (ref 70–152)
Glucose, Fasting: 95 mg/dL — ABNORMAL HIGH (ref 70–91)

## 2024-08-19 DIAGNOSIS — O24419 Gestational diabetes mellitus in pregnancy, unspecified control: Secondary | ICD-10-CM

## 2024-08-19 HISTORY — DX: Gestational diabetes mellitus in pregnancy, unspecified control: O24.419

## 2024-08-20 ENCOUNTER — Ambulatory Visit: Payer: Self-pay | Admitting: Obstetrics and Gynecology

## 2024-08-20 LAB — HORIZON CUSTOM: REPORT SUMMARY: NEGATIVE

## 2024-08-22 ENCOUNTER — Other Ambulatory Visit: Payer: Self-pay

## 2024-08-22 DIAGNOSIS — O24410 Gestational diabetes mellitus in pregnancy, diet controlled: Secondary | ICD-10-CM

## 2024-08-22 MED ORDER — GLUCOSE BLOOD VI STRP: ORAL_STRIP | 12 refills | Status: AC

## 2024-08-22 MED ORDER — ACCU-CHEK SOFTCLIX LANCETS MISC: 12 refills | Status: AC

## 2024-08-22 MED ORDER — ACCU-CHEK GUIDE W/DEVICE KIT
1.0000 | PACK | Freq: Four times a day (QID) | 0 refills | Status: AC
Start: 2024-08-22 — End: ?

## 2024-08-23 LAB — PANORAMA PRENATAL TEST FULL PANEL:PANORAMA TEST PLUS 5 ADDITIONAL MICRODELETIONS: FETAL FRACTION: 2.2

## 2024-08-27 NOTE — Telephone Encounter (Signed)
-----   Message from Carter Quarry sent at 08/26/2024  6:30 PM EDT ----- Insufficient fetal dna for panaroma  ----- Message ----- From: Rebecka Memos Lab Results In Sent: 08/17/2024  10:36 AM EDT To: Carter Quarry, MD

## 2024-08-27 NOTE — Telephone Encounter (Signed)
 Patient aware of Panorama results.  Will discuss with Dr at her next OB visit on 9/17.  Kathryn Huff

## 2024-09-04 ENCOUNTER — Ambulatory Visit (INDEPENDENT_AMBULATORY_CARE_PROVIDER_SITE_OTHER): Admitting: Obstetrics & Gynecology

## 2024-09-04 VITALS — BP 124/58 | HR 79 | Wt 303.0 lb

## 2024-09-04 DIAGNOSIS — Z6841 Body Mass Index (BMI) 40.0 and over, adult: Secondary | ICD-10-CM

## 2024-09-04 DIAGNOSIS — Z98891 History of uterine scar from previous surgery: Secondary | ICD-10-CM

## 2024-09-04 DIAGNOSIS — O0990 Supervision of high risk pregnancy, unspecified, unspecified trimester: Secondary | ICD-10-CM | POA: Diagnosis not present

## 2024-09-04 DIAGNOSIS — E66813 Obesity, class 3: Secondary | ICD-10-CM | POA: Diagnosis not present

## 2024-09-04 DIAGNOSIS — Z3A16 16 weeks gestation of pregnancy: Secondary | ICD-10-CM | POA: Diagnosis not present

## 2024-09-04 NOTE — Progress Notes (Signed)
   PRENATAL VISIT NOTE  Subjective:  Kathryn Huff is a 33 y.o. [phone] at [redacted]w[redacted]d being seen today for ongoing prenatal care.  She is currently monitored for the following issues for this high-risk pregnancy and has Preventative health care; ADHD (attention deficit hyperactivity disorder); Migraine; Hypertriglyceridemia; Morbid obesity (HCC) with starting BMI 44; Insulin  resistance; Depression; Vitamin D  deficiency; Celiac disease; Other fatigue; SOBOE (shortness of breath on exertion); Pre-diabetes; Class 3 severe obesity due to excess calories with body mass index (BMI) of 40.0 to 44.9 in adult; Anxiety; Polyphagia; GAD (generalized anxiety disorder); BMI 40.0-44.9, adult (HCC); Elevated alkaline phosphatase level; Early stage of pregnancy; Complication of anesthesia; Supervision of high risk pregnancy, antepartum; and History of 2 cesarean sections on their problem list.  Patient reports no complaints.  Contractions: Not present. Vag. Bleeding: None.   . Denies leaking of fluid.   The following portions of the patient's history were reviewed and updated as appropriate: allergies, current medications, past family history, past medical history, past social history, past surgical history and problem list.   Objective:    Vitals:   09/04/24 1130  BP: (!) 124/58  Pulse: 79  Weight: (!) 303 lb 0.6 oz (137.5 kg)    Fetal Status:  Fetal Heart Rate (bpm): 149        General: Alert, oriented and cooperative. Patient is in no acute distress.  Skin: Skin is warm and dry. No rash noted.   Cardiovascular: Normal heart rate noted  Respiratory: Normal respiratory effort, no problems with respiration noted  Abdomen: Soft, gravid, appropriate for gestational age.  Pain/Pressure: Absent     Pelvic: Cervical exam deferred        Extremities: Normal range of motion.  Edema: None  Mental Status: Normal mood and affect. Normal behavior. Normal judgment and thought content.   Assessment and Plan:   Pregnancy: G4P2012 at [redacted]w[redacted]d 1. [redacted] weeks gestation of pregnancy (Primary)  - AFP, Serum, Open Spina Bifida  2. Supervision of high risk pregnancy, antepartum   3. History of 2 cesarean sections   4. Class 3 severe obesity due to excess calories with body mass index (BMI) of 40.0 to 44.9 in adult   Preterm labor symptoms and general obstetric precautions including but not limited to vaginal bleeding, contractions, leaking of fluid and fetal movement were reviewed in detail with the patient. Please refer to After Visit Summary for other counseling recommendations.   Return in about 4 weeks (around 10/02/2024).  Future Appointments  Date Time Provider Department Center  09/09/2024  9:00 AM Knox Leita CROME, RD [facility]  10/02/2024 10:00 AM [facility] PROVIDER 1 [facility]  10/02/2024 10:30 AM [facility] US1 [facility]  10/03/2024 11:15 AM Barbra Lang PARAS, DO [facility] None  06/13/2025  9:00 AM Daryl Setter, NP [facility] [id number], MD

## 2024-09-06 LAB — AFP, SERUM, OPEN SPINA BIFIDA
AFP MoM: 0.7
AFP Value: 15.5 ng/mL
Gest. Age on Collection Date: 16 wk
Maternal Age At EDD: 33.1 a
OSBR Risk 1 IN: 10000
Test Results:: NEGATIVE
Weight: 303 [lb_av]

## 2024-09-09 ENCOUNTER — Encounter: Payer: Self-pay | Admitting: Dietician

## 2024-09-09 ENCOUNTER — Encounter: Attending: Obstetrics and Gynecology | Admitting: Dietician

## 2024-09-09 DIAGNOSIS — O24410 Gestational diabetes mellitus in pregnancy, diet controlled: Secondary | ICD-10-CM | POA: Insufficient documentation

## 2024-09-09 NOTE — Progress Notes (Signed)
 Patient was seen for Gestational Diabetes on 09/09/2024  Start time 0910 and End time 1010   Estimated due date: 02/15/2024; [redacted]w[redacted]d  Clinical: Medications: asparin, prenatal MVI Medical History: GDM current and past, celiac disease (2018), prediabetes Labs: OGTT fasting 95, 1 hour 130, 2 hour 115 on 08/16/2024, A1c 5.6% on 08/08/2024 decreased from 5.7% 01/11/2024, insulin  103 on 01/11/2024  Dietary and Lifestyle History: Patient lives with her husband and 2 children. She works as an Estate agent at [employer]. Patient follows a Gluten Free diet due to Celiac. Eats out frequently She was going to [facility] and will resume after birth.  Physical Activity: 30-60 minute walks daily Stress: moderate overall - higher at work Sleep: good 6-8 hours per night  24 hr Recall:  First Meal:  Premier protein shake OR breakfast sandwich OR egg cup Snack:  occasional  Second meal:  fruit or cucumber and tomatoes Snack:  occasional Third meal:  leftover [place] Snack:  none Beverages:  water, lemonade, occasional coke zero, coffee  OR bloom energy drink  NUTRITION INTERVENTION  Nutrition education (E-1) on the following topics:   Initial Follow-up  [x]  []  Definition of Gestational Diabetes [x]  []  Why dietary management is important in controlling blood glucose [x]  []  Effects each nutrient has on blood glucose levels [x]  []  Simple carbohydrates vs complex carbohydrates [x]  []  Fluid intake [x]  []  Creating a balanced meal plan []  []  Carbohydrate counting  [x]  []  When to check blood glucose levels [x]  []  Proper blood glucose monitoring techniques [x]  []  Effect of stress and stress reduction techniques  [x]  []  Exercise effect on blood glucose levels, appropriate exercise during pregnancy [x]  []  Importance of limiting caffeine and abstaining from alcohol and smoking [x]  []  Medications used for blood sugar control during pregnancy [x]  []  Hypoglycemia and  rule of 15 [x]  []  Postpartum self care  Accu Chek Guid me Patient has a meter prior to visit. Patient is testing pre breakfast and 2 hours after each meal. FBS: 91 this am  with high of 99 Postprandial: 124 after dinner last night with high of 129  Patient instructed to monitor glucose levels: FBS: 60 - <= 95 mg/dL; 2 hour: <= 879 mg/dL  Patient received handouts: Nutrition Diabetes and Pregnancy Carbohydrate Counting List Blood glucose log Snack ideas for diabetes during pregnancy  Patient will be seen for follow-up as needed.

## 2024-09-17 ENCOUNTER — Encounter: Payer: Self-pay | Admitting: Family Medicine

## 2024-10-01 ENCOUNTER — Other Ambulatory Visit

## 2024-10-01 ENCOUNTER — Ambulatory Visit

## 2024-10-02 ENCOUNTER — Other Ambulatory Visit: Payer: Self-pay | Admitting: *Deleted

## 2024-10-02 ENCOUNTER — Ambulatory Visit (HOSPITAL_BASED_OUTPATIENT_CLINIC_OR_DEPARTMENT_OTHER)

## 2024-10-02 ENCOUNTER — Ambulatory Visit: Attending: Family Medicine | Admitting: Maternal & Fetal Medicine

## 2024-10-02 VITALS — BP 113/49

## 2024-10-02 DIAGNOSIS — Z3A12 12 weeks gestation of pregnancy: Secondary | ICD-10-CM

## 2024-10-02 DIAGNOSIS — E669 Obesity, unspecified: Secondary | ICD-10-CM

## 2024-10-02 DIAGNOSIS — O24410 Gestational diabetes mellitus in pregnancy, diet controlled: Secondary | ICD-10-CM

## 2024-10-02 DIAGNOSIS — Z98891 History of uterine scar from previous surgery: Secondary | ICD-10-CM | POA: Diagnosis not present

## 2024-10-02 DIAGNOSIS — Z3A20 20 weeks gestation of pregnancy: Secondary | ICD-10-CM | POA: Insufficient documentation

## 2024-10-02 DIAGNOSIS — Z3689 Encounter for other specified antenatal screening: Secondary | ICD-10-CM | POA: Insufficient documentation

## 2024-10-02 DIAGNOSIS — O99212 Obesity complicating pregnancy, second trimester: Secondary | ICD-10-CM

## 2024-10-02 DIAGNOSIS — O34219 Maternal care for unspecified type scar from previous cesarean delivery: Secondary | ICD-10-CM

## 2024-10-02 DIAGNOSIS — T8859XS Other complications of anesthesia, sequela: Secondary | ICD-10-CM

## 2024-10-02 DIAGNOSIS — O321XX0 Maternal care for breech presentation, not applicable or unspecified: Secondary | ICD-10-CM | POA: Diagnosis not present

## 2024-10-02 DIAGNOSIS — O358XX0 Maternal care for other (suspected) fetal abnormality and damage, not applicable or unspecified: Secondary | ICD-10-CM

## 2024-10-02 NOTE — Progress Notes (Signed)
 Patient information  Patient Name: Kathryn Huff  Patient MRN:   [id number]  Referring practice: [facility] Referring Provider: [place] [facility] [place] ([facility])  Problem List   Patient Active Problem List   Diagnosis Date Noted   History of 2 cesarean sections 08/09/2024   Supervision of high risk pregnancy, antepartum 07/11/2024   Complication of anesthesia (difficult epidural)    Elevated alkaline phosphatase level 02/14/2024   Obesity complicating pregnancy in second trimester 05/31/2023   GAD (generalized anxiety disorder) 11/30/2022   Anxiety 10/25/2022   Pre-existing severe obesity in mother affecting pregnancy (HCC) 07/27/2022   Gestational diabetes mellitus in pregnancy, diet controlled 07/27/2022   Celiac disease 12/22/2017   Vitamin D  deficiency 08/17/2017   Insulin  resistance 07/27/2017   Depression 07/27/2017   ADHD (attention deficit hyperactivity disorder) 05/30/2014   Maternal Fetal Medicine Consult Kathryn Huff is a 33 y.o. [id number] at [redacted]w[redacted]d here for ultrasound and consultation. She had  Insufficient fetal DNA x2. Carrier screening was Negative for the basic screening (SMA, alpha-thal, beta-thal, and cystic fibroisis. Maternal serum AFP was negative. She has no acute concerns.   Today we focused on the following:   Prior CD x2: Desires TOLAC at this point but will continue to talk to her OB provider.  Insufficient fetal DNA x2: I discussed the limitations of ultrasound in diagnosing genetic abnormalities.  We discussed the age-related risk of Down syndrome and other chromosomal defects.  The patient explained that she is comfortable with her risk currently and does not want future testing or amniocentesis.  GDM, diet controlled: We discussed the obstetric implications of this diagnosis as well as the importance of proper glycemic control.  Currently the patient states she has good blood sugar control.  Medication is indicated if greater than 50% of the glucose  values are abnormal or there are signs of poor control on ultrasound.  Elevated BMI I discussed the potential complications associated with obesity in pregnancy.  These complications include but are not limited to increased risk of excessive maternal weight gain, fetal growth abnormalities, fetal congenital disorders, inability to visualize fetal anatomic structures on ultrasound, gestational diabetes, hypertensive disorders of pregnancy, operative birth including cesarean delivery or assisted vaginal delivery, delayed wound healing and many long-term health complications.  I discussed the need for continued growth ultrasounds and possibly antenatal testing depending upon how the pregnancy course progresses.  Maternal weight gain should be limited to 10 to 20 pounds during the pregnancy.    Anesthesia complication: The patient reports that she had a difficult epidural with her previous delivery.   Sonographic findings Single intrauterine pregnancy at 20w 6d  Fetal cardiac activity:  Observed and appears normal. Presentation: Breech. The anatomic structures that were well seen appear normal without evidence of soft markers. Due to poor acoustic windows some structures remain suboptimally visualized. Fetal biometry shows the estimated fetal weight at the 29 percentile.  Amniotic fluid: Within normal limits.  MVP: 4.7 cm. Placenta: Posterior. Adnexa: No abnormality visualized. Cervical length: 4.3 cm.  There are limitations of prenatal ultrasound such as the inability to detect certain abnormalities due to poor visualization. Various factors such as fetal position, gestational age and maternal body habitus may increase the difficulty in visualizing the fetal anatomy.    Recommendations - EDD should be 02/13/2025 based on  C R L 1st  (07/24/24) due to uncertain/approximate LMP. - Aneuploidy screening was completed at the [facility] office - Anatomy ultrasound was done today with the above  findings (see  report). - Aspirin  81-162 mg continued throughout the pregnancy for preeclampsia prophylaxis. - Baseline labs: CMP, CBC, urine protein creatinine ratio if not previously completed. Repeat with any concerns for preeclampsia. - Blood pressure goal of < 140 systolic and < 90 diastolic. Antihypertensive medication should be added/adjusted until BP goal is achieved.  - Continue diabetic care - Fetal echocardiogram is not indicated based on the patient's A1c but should be considered if the cardiac anatomy is poorly visualized at the next ultrasound. - Serial growth ultrasounds every 4 weeks starting at 24 to 28 weeks until delivery. - Antenatal testing (usually weekly BPP or NST) weekly at 32-34 weeks until delivery. - Anesthesia consultation should occur in the third trimester or upon admission.     Review of Systems: A review of systems was performed and was negative except per HPI   Past Obstetrical History:  OB History  Gravida Para Term Preterm AB Living  4 2 2  0 1 2  SAB IAB Ectopic Multiple Live Births  1    2    # Outcome Date GA Lbr Len/2nd Weight Sex Type Anes PTL Lv  4 Current           3 Term 03/25/21 [redacted]w[redacted]d  8 lb 3 oz (3.714 kg) F CS-LTranv Spinal N LIV  2 Term 06/14/19 [redacted]w[redacted]d  8 lb 1 oz (3.657 kg) M CS-LTranv EPI N LIV  1 SAB              Past Medical History:  Past Medical History:  Diagnosis Date   ADD (attention deficit disorder)    ADHD 2002   Anxiety    Back pain    Celiac disease 12/22/2017   Chest pain    Complication of anesthesia    Constipation    Depression    Fatty liver    GDM (gestational diabetes mellitus) 08/2024   Heart murmur    Liver function abnormality    HIGH   Migraines    Obesity    Polyphagia 10/25/2022   Prediabetes    Vitamin D  deficiency 2017     Past Surgical History:    Past Surgical History:  Procedure Laterality Date   CESAREAN SECTION  2020   CESAREAN SECTION  03/25/2021   MOUTH SURGERY  2012   two teeth surgically  removed.       Home Medications:   Current Outpatient Medications on File Prior to Visit  Medication Sig Dispense Refill   Accu-Chek Softclix Lancets lancets Use as instructed 100 each 12   aspirin  EC 81 MG tablet Take 1 tablet (81 mg total) by mouth daily. Take after 12 weeks for prevention of preeclampsia later in pregnancy 300 tablet 2   Blood Glucose Monitoring Suppl (ACCU-CHEK GUIDE) w/Device KIT 1 Device by Does not apply route 4 (four) times daily. 1 kit 0   glucose blood test strip Use as instructed 100 each 12   Prenatal Vit-Fe Fumarate-FA (MULTIVITAMIN-PRENATAL) 27-0.8 MG TABS tablet Take 1 tablet by mouth daily at 12 noon.     acetaminophen  (TYLENOL ) 500 MG tablet Take 500 mg by mouth every 6 (six) hours as needed for mild pain. (Patient not taking: Reported on 10/02/2024)     No current facility-administered medications on file prior to visit.      Allergies:   Allergies  Allergen Reactions   Gluten Meal Other (See Comments) and Diarrhea     Physical Exam:   Vitals:   10/02/24 1002  BP: (!) 113/49   Sitting comfortably on the sonogram table Nonlabored breathing Normal rate and rhythm Abdomen is nontender  Thank you for the opportunity to be involved with this patient's care. Please let us  know if we can be of any further assistance.   60 minutes of time was spent reviewing the patient's chart including labs, imaging and documentation.  At least 50% of this time was spent with direct patient care discussing the diagnosis, management and prognosis of her care.  Delora Smaller MFM, [place]   10/02/2024  2:00 PM

## 2024-10-03 ENCOUNTER — Ambulatory Visit: Admitting: Family Medicine

## 2024-10-03 VITALS — BP 109/46 | HR 81 | Wt 299.0 lb

## 2024-10-03 DIAGNOSIS — Z6841 Body Mass Index (BMI) 40.0 and over, adult: Secondary | ICD-10-CM

## 2024-10-03 DIAGNOSIS — Z3A21 21 weeks gestation of pregnancy: Secondary | ICD-10-CM | POA: Diagnosis not present

## 2024-10-03 DIAGNOSIS — Z98891 History of uterine scar from previous surgery: Secondary | ICD-10-CM

## 2024-10-03 DIAGNOSIS — O24410 Gestational diabetes mellitus in pregnancy, diet controlled: Secondary | ICD-10-CM | POA: Diagnosis not present

## 2024-10-03 DIAGNOSIS — O0990 Supervision of high risk pregnancy, unspecified, unspecified trimester: Secondary | ICD-10-CM | POA: Diagnosis not present

## 2024-10-03 DIAGNOSIS — K900 Celiac disease: Secondary | ICD-10-CM

## 2024-10-03 NOTE — Progress Notes (Signed)
   PRENATAL VISIT NOTE  Subjective:  Kathryn Huff is a 33 y.o. [phone] at [redacted]w[redacted]d being seen today for ongoing prenatal care.  She is currently monitored for the following issues for this low-risk pregnancy and has ADHD (attention deficit hyperactivity disorder); Insulin  resistance; Depression; Vitamin D  deficiency; Celiac disease; Pre-existing severe obesity in mother affecting pregnancy (HCC); Anxiety; GAD (generalized anxiety disorder); Obesity complicating pregnancy in second trimester; Elevated alkaline phosphatase level; Complication of anesthesia (difficult epidural); Supervision of high risk pregnancy, antepartum; History of 2 cesarean sections; and Gestational diabetes mellitus in pregnancy, diet controlled on their problem list.  Patient reports some pubic pain occasionally. .  Contractions: Not present. Vag. Bleeding: None.  Movement: Present. Denies leaking of fluid.   The following portions of the patient's history were reviewed and updated as appropriate: allergies, current medications, past family history, past medical history, past social history, past surgical history and problem list.   Objective:    Vitals:   10/03/24 1115  BP: (!) 109/46  Pulse: 81  Weight: 299 lb (135.6 kg)    Fetal Status:  Fetal Heart Rate (bpm): 158   Movement: Present    General: Alert, oriented and cooperative. Patient is in no acute distress.  Skin: Skin is warm and dry. No rash noted.   Cardiovascular: Normal heart rate noted  Respiratory: Normal respiratory effort, no problems with respiration noted  Abdomen: Soft, gravid, appropriate for gestational age.  Pain/Pressure: Present     Pelvic: Cervical exam deferred        Extremities: Normal range of motion.  Edema: None  Mental Status: Normal mood and affect. Normal behavior. Normal judgment and thought content.   Assessment and Plan:  Pregnancy: [id number] at [redacted]w[redacted]d 1. [redacted] weeks gestation of pregnancy (Primary)  2. Supervision of high  risk pregnancy, antepartum FHT normal  3. Diet controlled gestational diabetes mellitus (GDM) in second trimester CBGs controlled  4. History of 2 cesarean sections Desires TOLAC. Will need to watch fetal growth to be able to determine likelihood of success  5. Celiac disease  6. BMI 40.0-44.9, adult (HCC) Serial US   Preterm labor symptoms and general obstetric precautions including but not limited to vaginal bleeding, contractions, leaking of fluid and fetal movement were reviewed in detail with the patient. Please refer to After Visit Summary for other counseling recommendations.   No follow-ups on file.  Future Appointments  Date Time Provider Department Center  10/30/2024 11:15 AM Barbra Lang PARAS, DO [facility] None  11/27/2024  8:15 AM Abigail Rollo DASEN, MD [facility] None  12/13/2024 10:15 AM [facility] PROVIDER 1 [facility]  12/13/2024 10:30 AM [facility] US4 [facility]  12/16/2024  8:55 AM Abigail, Rollo DASEN, MD [facility] None  06/13/2025  9:00 AM Daryl Setter, NP [facility] [address]    Ellizabeth Dacruz J Angelique Chevalier, DO

## 2024-10-17 ENCOUNTER — Other Ambulatory Visit (HOSPITAL_COMMUNITY)
Admission: RE | Admit: 2024-10-17 | Discharge: 2024-10-17 | Disposition: A | Discharge status: Home / Self Care | Source: Ambulatory Visit | Attending: Family Medicine | Admitting: Family Medicine

## 2024-10-17 ENCOUNTER — Ambulatory Visit

## 2024-10-17 VITALS — BP 103/59 | HR 91 | Ht 69.0 in | Wt 301.0 lb

## 2024-10-17 DIAGNOSIS — N898 Other specified noninflammatory disorders of vagina: Secondary | ICD-10-CM | POA: Diagnosis present

## 2024-10-18 LAB — CERVICOVAGINAL ANCILLARY ONLY
Bacterial Vaginitis (gardnerella): NEGATIVE
Candida Glabrata: NEGATIVE
Candida Vaginitis: NEGATIVE
Comment: NEGATIVE
Comment: NEGATIVE
Comment: NEGATIVE

## 2024-10-21 NOTE — Progress Notes (Signed)
 SUBJECTIVE:  33 y.o. female complains of white vaginal discharge for 6 day(s). Denies abnormal vaginal bleeding or significant pelvic pain or fever.Denies history of known exposure to STD.  Patient's last menstrual period was 05/12/2024 (approximate).  OBJECTIVE:  She appears alert, well appearing, in no apparent distress.  ASSESSMENT:  Vaginal Discharge    PLAN:  Swab for BV and yeast sent to lab. Treatment: To be determined once lab results are received ROV prn if symptoms persist or worsen.   [facility] [employer]

## 2024-10-30 ENCOUNTER — Ambulatory Visit (INDEPENDENT_AMBULATORY_CARE_PROVIDER_SITE_OTHER): Admitting: Family Medicine

## 2024-10-30 VITALS — BP 113/55 | HR 79 | Wt 302.1 lb

## 2024-10-30 DIAGNOSIS — Z3A24 24 weeks gestation of pregnancy: Secondary | ICD-10-CM

## 2024-10-30 DIAGNOSIS — O24410 Gestational diabetes mellitus in pregnancy, diet controlled: Secondary | ICD-10-CM

## 2024-10-30 DIAGNOSIS — Z6841 Body Mass Index (BMI) 40.0 and over, adult: Secondary | ICD-10-CM

## 2024-10-30 DIAGNOSIS — O0990 Supervision of high risk pregnancy, unspecified, unspecified trimester: Secondary | ICD-10-CM

## 2024-10-30 DIAGNOSIS — Z98891 History of uterine scar from previous surgery: Secondary | ICD-10-CM

## 2024-10-30 DIAGNOSIS — O99342 Other mental disorders complicating pregnancy, second trimester: Secondary | ICD-10-CM | POA: Diagnosis not present

## 2024-10-30 DIAGNOSIS — K900 Celiac disease: Secondary | ICD-10-CM | POA: Diagnosis not present

## 2024-10-30 DIAGNOSIS — F411 Generalized anxiety disorder: Secondary | ICD-10-CM

## 2024-10-30 NOTE — Progress Notes (Signed)
 PRENATAL VISIT NOTE  Subjective:  Kathryn Huff is a 33 y.o. [phone] at [redacted]w[redacted]d being seen today for ongoing prenatal care.  She is currently monitored for the following issues for this high-risk pregnancy and has ADHD (attention deficit hyperactivity disorder); Insulin  resistance; Depression; Vitamin D  deficiency; Celiac disease; Pre-existing severe obesity in mother affecting pregnancy (HCC); Anxiety; GAD (generalized anxiety disorder); Obesity complicating pregnancy in second trimester; Elevated alkaline phosphatase level; Complication of anesthesia (difficult epidural); Supervision of high risk pregnancy, antepartum; History of 2 cesarean sections; and Gestational diabetes mellitus in pregnancy, diet controlled on their problem list.  Patient reports backache.  Contractions: Not present. Vag. Bleeding: None.  Movement: Present. Denies leaking of fluid.   The following portions of the patient's history were reviewed and updated as appropriate: allergies, current medications, past family history, past medical history, past social history, past surgical history and problem list.   Objective:   Vitals:   10/30/24 1128  BP: (!) 113/55  Pulse: 79  Weight: (!) 302 lb 1.9 oz (137 kg)    Fetal Status:  Fetal Heart Rate (bpm): 147   Movement: Present    General: Alert, oriented and cooperative. Patient is in no acute distress.  Skin: Skin is warm and dry. No rash noted.   Cardiovascular: Normal heart rate noted  Respiratory: Normal respiratory effort, no problems with respiration noted  Abdomen: Soft, gravid, appropriate for gestational age.  Pain/Pressure: Absent     Pelvic: Cervical exam deferred        Extremities: Normal range of motion.  Edema: Trace  Mental Status: Normal mood and affect. Normal behavior. Normal judgment and thought content.      08/08/2024    9:57 AM 06/12/2024    8:30 AM 06/09/2023   11:15 AM  Depression screen PHQ 2/9  Decreased Interest 0 0 0  Down,  Depressed, Hopeless 0 0 0  PHQ - 2 Score 0 0 0  Altered sleeping 1  0  Tired, decreased energy 1  0  Change in appetite 1  0  Feeling bad or failure about yourself  0  0  Trouble concentrating 0  0  Moving slowly or fidgety/restless 0  0  Suicidal thoughts 0  0  PHQ-9 Score 3   0   Difficult doing work/chores   Not difficult at all     Data saved with a previous flowsheet row definition        08/08/2024    9:57 AM  GAD 7 : Generalized Anxiety Score  Nervous, Anxious, on Edge 0  Control/stop worrying 1  Worry too much - different things 0  Trouble relaxing 1  Restless 0  Easily annoyed or irritable 1  Afraid - awful might happen 0  Total GAD 7 Score 3    Assessment and Plan:  Pregnancy: [id number] at [redacted]w[redacted]d 1. Supervision of high risk pregnancy, antepartum (Primary) FHT normal  2. Diet controlled gestational diabetes mellitus (GDM) in second trimester Fastings less than 95. Once or twice a week, PP CBG > 120. Otherwise controlled Continue serial growth US   3. Celiac disease  4. History of 2 cesarean sections Desires TOLAC  5. BMI 40.0-44.9, adult (HCC)  6. GAD (generalized anxiety disorder)   Preterm labor symptoms and general obstetric precautions including but not limited to vaginal bleeding, contractions, leaking of fluid and fetal movement were reviewed in detail with the patient. Please refer to After Visit Summary for other counseling recommendations.   No follow-ups on file.  Future Appointments  Date Time Provider Department Center  11/27/2024  8:15 AM Abigail, Rollo DASEN, MD [facility] None  12/13/2024 10:15 AM [facility] PROVIDER 1 [facility]  12/13/2024 10:30 AM [facility] US3 [facility]  12/16/2024  8:55 AM Abigail, Rollo DASEN, MD [facility] None  06/13/2025  9:00 AM Daryl Setter, NP [facility] [address]    Junie Avilla J Medina Degraffenreid, DO

## 2024-11-22 ENCOUNTER — Telehealth: Payer: Self-pay

## 2024-11-22 NOTE — Telephone Encounter (Signed)
 Received message from triage line that patient was experiencing headache with tingling in left hand, lower left side of lip, and entire tongue.  Patient was not seen to evaluated.  Called patient this am to assess.  She denies being seen as symptoms stopped while on phone with triage nurse.  Instructed patient if those symptoms occur again she should be evaluated at [facility].  Patient verbalized understanding.  Erminio DELENA Rumps, RN

## 2024-11-27 ENCOUNTER — Ambulatory Visit (INDEPENDENT_AMBULATORY_CARE_PROVIDER_SITE_OTHER): Admitting: Obstetrics and Gynecology

## 2024-11-27 ENCOUNTER — Encounter: Payer: Self-pay | Admitting: Obstetrics and Gynecology

## 2024-11-27 VITALS — BP 119/59 | HR 87 | Wt 303.0 lb

## 2024-11-27 DIAGNOSIS — Z23 Encounter for immunization: Secondary | ICD-10-CM

## 2024-11-27 DIAGNOSIS — Z3A28 28 weeks gestation of pregnancy: Secondary | ICD-10-CM | POA: Diagnosis not present

## 2024-11-27 DIAGNOSIS — Z8669 Personal history of other diseases of the nervous system and sense organs: Secondary | ICD-10-CM | POA: Diagnosis not present

## 2024-11-27 DIAGNOSIS — Z8759 Personal history of other complications of pregnancy, childbirth and the puerperium: Secondary | ICD-10-CM | POA: Diagnosis not present

## 2024-11-27 DIAGNOSIS — O0990 Supervision of high risk pregnancy, unspecified, unspecified trimester: Secondary | ICD-10-CM

## 2024-11-27 DIAGNOSIS — O24419 Gestational diabetes mellitus in pregnancy, unspecified control: Secondary | ICD-10-CM | POA: Diagnosis not present

## 2024-11-27 DIAGNOSIS — O99210 Obesity complicating pregnancy, unspecified trimester: Secondary | ICD-10-CM | POA: Diagnosis not present

## 2024-11-27 DIAGNOSIS — O34219 Maternal care for unspecified type scar from previous cesarean delivery: Secondary | ICD-10-CM

## 2024-11-27 NOTE — Progress Notes (Signed)
 PRENATAL VISIT NOTE  Subjective:  Kathryn Huff is a 33 y.o. [phone] at 100w6d being seen today for ongoing prenatal care.  She is currently monitored for the following issues for this high-risk pregnancy and has ADHD (attention deficit hyperactivity disorder); Insulin  resistance; Depression; Vitamin D  deficiency; Celiac disease; Pre-existing severe obesity in mother affecting pregnancy (HCC); Anxiety; GAD (generalized anxiety disorder); Obesity complicating pregnancy in second trimester; Elevated alkaline phosphatase level; Complication of anesthesia (difficult epidural); Supervision of high risk pregnancy, antepartum; History of 2 cesarean sections; and Gestational diabetes mellitus in pregnancy, diet controlled on their problem list.  Patient reports migraine last week with tingling in her hand x few minutes. Symptoms of tingling resolved within a few minutes. No other associated symptoms. Works at urgent care. Hx migraines.  Contractions: Not present. Vag. Bleeding: None.  Movement: Present. Denies leaking of fluid.   The following portions of the patient's history were reviewed and updated as appropriate: allergies, current medications, past family history, past medical history, past social history, past surgical history and problem list.   Objective:   Vitals:   11/27/24 0837  BP: (!) 119/59  Pulse: 87  Weight: (!) 303 lb (137.4 kg)    Fetal Status:  Fetal Heart Rate (bpm): 140 Fundal Height: 29 cm Movement: Present    General: Alert, oriented and cooperative. Patient is in no acute distress.  Skin: Skin is warm and dry. No rash noted.   Cardiovascular: Normal heart rate noted  Respiratory: Normal respiratory effort, no problems with respiration noted  Abdomen: Soft, gravid, appropriate for gestational age.  Pain/Pressure: Absent     Pelvic: Cervical exam deferred        Extremities: Normal range of motion.  Edema: None  Mental Status: Normal mood and affect. Normal  behavior. Normal judgment and thought content.      08/08/2024    9:57 AM 06/12/2024    8:30 AM 06/09/2023   11:15 AM  Depression screen PHQ 2/9  Decreased Interest 0 0 0  Down, Depressed, Hopeless 0 0 0  PHQ - 2 Score 0 0 0  Altered sleeping 1  0  Tired, decreased energy 1  0  Change in appetite 1  0  Feeling bad or failure about yourself  0  0  Trouble concentrating 0  0  Moving slowly or fidgety/restless 0  0  Suicidal thoughts 0  0  PHQ-9 Score 3   0   Difficult doing work/chores   Not difficult at all     Data saved with a previous flowsheet row definition        08/08/2024    9:57 AM  GAD 7 : Generalized Anxiety Score  Nervous, Anxious, on Edge 0  Control/stop worrying 1  Worry too much - different things 0  Trouble relaxing 1  Restless 0  Easily annoyed or irritable 1  Afraid - awful might happen 0  Total GAD 7 Score 3    Assessment and Plan:  Pregnancy: [id number] at [redacted]w[redacted]d 1. Supervision of high risk pregnancy, antepartum (Primary) Anticipatory guidance Fetal kick counts reviewed Plans to breastfeed  - CBC - RPR W/RFLX TO RPR TITER, TREPONEMAL AB, SCREEN AND DIAGNOSIS - HIV Antibody (routine testing w rflx) - Tdap vaccine greater than or equal to 7yo IM  2. Gestational diabetes mellitus (GDM), antepartum, gestational diabetes method of control unspecified Reports fastings 95-96, mostly diet related, discussed dietary changes to reduce fastings, will send in BS log via mychart, no log today.  Postprandials reported normal  3. Obesity during pregnancy Serial growth and antenatal testing Continue aspirin   4. History of cesarean delivery, currently pregnant Plans TOLAC 2020 CS- failed induction/fetal intolerance 2022 CS- scheduled repeat We reviewed the risks/benefits/alternatives of trial of labor after cesarean section (TOLAC) versus repeat cesarean section as outlined in the VBAC (vaginal birth after cesarean section) consent form.  We discussed the risks  associated with repeat c-section including but not limited to bleeding, infection, damage to surrounding structures/organs. We discussed the risks associated with TOLAC. The risk of uterine rupture is about 0.5-1% with spontaneous labor and higher with pitocin augmentation or induction. We reviewed that if a uterine rupture were to occur, it is a surgical emergency and carries risks including but not limited to maternal hemorrhage, blood transfusion, hysterectomy and fetal risks of severe morbidity or mortality including brain damage or death.   - After considering her options, she would like to [facility] - VBAC consent form provided to the patient and was signed today   5. Need for tetanus, diphtheria, and acellular pertussis (Tdap) vaccine Tdap today  6. H/O migraine during pregnancy Symptoms seem consistent with migraine however if neurological symptoms recur or with new/worsening, report to hospital Reviewed magnesium for migraine/HA prevention  Preterm labor symptoms and general obstetric precautions including but not limited to vaginal bleeding, contractions, leaking of fluid and fetal movement were reviewed in detail with the patient. Please refer to After Visit Summary for other counseling recommendations.   Return in about 2 weeks (around 12/11/2024).  Future Appointments  Date Time Provider Department Center  12/13/2024 10:15 AM [facility] PROVIDER 1 [facility]  12/13/2024 10:30 AM [facility] US3 [facility]  12/16/2024  8:55 AM Abigail, Rollo DASEN, MD [facility] None  06/13/2025  9:00 AM Daryl Setter, NP [facility] [id number] Rollo DASEN Abigail, MD

## 2024-12-13 ENCOUNTER — Other Ambulatory Visit: Payer: Self-pay | Admitting: *Deleted

## 2024-12-13 ENCOUNTER — Ambulatory Visit: Admitting: Obstetrics and Gynecology

## 2024-12-13 ENCOUNTER — Ambulatory Visit: Attending: Maternal & Fetal Medicine

## 2024-12-13 VITALS — BP 105/60

## 2024-12-13 DIAGNOSIS — E6689 Other obesity not elsewhere classified: Secondary | ICD-10-CM

## 2024-12-13 DIAGNOSIS — O34219 Maternal care for unspecified type scar from previous cesarean delivery: Secondary | ICD-10-CM | POA: Insufficient documentation

## 2024-12-13 DIAGNOSIS — Z3A31 31 weeks gestation of pregnancy: Secondary | ICD-10-CM

## 2024-12-13 DIAGNOSIS — O24410 Gestational diabetes mellitus in pregnancy, diet controlled: Secondary | ICD-10-CM | POA: Insufficient documentation

## 2024-12-13 DIAGNOSIS — O99212 Obesity complicating pregnancy, second trimester: Secondary | ICD-10-CM | POA: Diagnosis present

## 2024-12-13 DIAGNOSIS — O358XX0 Maternal care for other (suspected) fetal abnormality and damage, not applicable or unspecified: Secondary | ICD-10-CM | POA: Insufficient documentation

## 2024-12-13 DIAGNOSIS — O99213 Obesity complicating pregnancy, third trimester: Secondary | ICD-10-CM

## 2024-12-13 DIAGNOSIS — E669 Obesity, unspecified: Secondary | ICD-10-CM

## 2024-12-13 NOTE — Progress Notes (Signed)
 Maternal-Fetal Medicine Consultation  Name: Kathryn Huff  MRN: [id number]  GA: [id number] [redacted]w[redacted]d   Patient is here for fetal growth assessment.  Her problems include: - Gestational diabetes.  Reportedly well-controlled on diet.  She reports both fasting and postprandial levels are within normal range. - Pregravid BMI 43. - Two Previous cesarean deliveries.  Her first cesarean delivery was performed because of failure to progress in labor.  Her second cesarean delivery was elective. - Insufficient fetal DNA.  Patient had opted not to have amniocentesis. Ultrasound Normal fetal growth and amniotic fluid.  Cephalic presentation.  Placenta is posterior and there is no evidence of previa or placenta accreta spectrum. Previous cesarean delivery Patient is keen on VBAC. I reassured the patient of normal placental location and that there is no evidence of previa or placenta accreta spectrum.  I discussed the benefits and risks of vaginal birth after cesarean section (VBAC).  The risk of uterine scar dehiscence is about 1% to 2% after 2 cesarean deliveries.  Cesarean deliveries increase the risks of hemorrhage, infection and venous thromboembolism.  Repeat cesarean deliveries increase the risks of placenta previa and/or placenta accreta spectrum. Long-term complications include small bowel obstruction.  Based on [facility] calculator, the likelihood of successful vaginal delivery is 31% (if arrest of descent was the indication for first cesarean) or 45% (with no arrest). Calculation was done after the patient left.  Gestational diabetes Diabetes is well-controlled on diet.  We recommend fetal growth assessment in 4 weeks.  Patient is checking her blood glucose regularly.  Pregravid BMI  Grade 3 obesity is independently associated with increased risk of stillbirth (2.5- to 3-fold), but the absolute risk is very small.  I discussed protocol of weekly antenatal testing from [redacted] weeks  gestation until delivery.   Ultrasound has limitations in the resolution of ultrasound images and fetal anomalies may be missed.    Recommendations - An appointment was made for her to return in 4 weeks for fetal growth assessment and BPP. - Weekly NSTs from [redacted] weeks gestation at your office. - VBAC counseling.     Consultation including face-to-face (more than 50%) counseling 30 minutes.

## 2024-12-13 NOTE — Progress Notes (Signed)
 Patient reports that she checks blood glucose at home and readings are as follows: Fasting levels range from 82 to 98, and two hour post-meal reading ranges from 99.to 130.

## 2024-12-16 ENCOUNTER — Ambulatory Visit (INDEPENDENT_AMBULATORY_CARE_PROVIDER_SITE_OTHER): Admitting: Obstetrics and Gynecology

## 2024-12-16 VITALS — BP 109/42 | HR 84 | Wt 304.1 lb

## 2024-12-16 DIAGNOSIS — Z3A31 31 weeks gestation of pregnancy: Secondary | ICD-10-CM

## 2024-12-16 DIAGNOSIS — Z98891 History of uterine scar from previous surgery: Secondary | ICD-10-CM

## 2024-12-16 DIAGNOSIS — O34219 Maternal care for unspecified type scar from previous cesarean delivery: Secondary | ICD-10-CM

## 2024-12-16 DIAGNOSIS — O24410 Gestational diabetes mellitus in pregnancy, diet controlled: Secondary | ICD-10-CM | POA: Diagnosis not present

## 2024-12-16 DIAGNOSIS — O0990 Supervision of high risk pregnancy, unspecified, unspecified trimester: Secondary | ICD-10-CM

## 2024-12-16 NOTE — Progress Notes (Signed)
 "  PRENATAL VISIT NOTE  Subjective:  Kathryn Huff is a 33 y.o. H5E7987 at [redacted]w[redacted]d being seen today for ongoing prenatal care.  She is currently monitored for the following issues for this high-risk pregnancy and has ADHD (attention deficit hyperactivity disorder); Insulin  resistance; Depression; Vitamin D  deficiency; Celiac disease; Pre-existing severe obesity in mother affecting pregnancy (HCC); Anxiety; GAD (generalized anxiety disorder); Obesity complicating pregnancy in second trimester; Elevated alkaline phosphatase level; Complication of anesthesia (difficult epidural); Supervision of high risk pregnancy, antepartum; History of 2 cesarean sections; and Gestational diabetes mellitus in pregnancy, diet controlled on their problem list.  Patient reports no complaints.  Contractions: Irritability. Vag. Bleeding: None.  Movement: Present. Denies leaking of fluid.   The following portions of the patient's history were reviewed and updated as appropriate: allergies, current medications, past family history, past medical history, past social history, past surgical history and problem list.   Objective:   Vitals:   12/16/24 0900  BP: (!) 109/42  Pulse: 84  Weight: (!) 304 lb 1.9 oz (137.9 kg)    Fetal Status:  Fetal Heart Rate (bpm): 148 Fundal Height: 31 cm Movement: Present    General: Alert, oriented and cooperative. Patient is in no acute distress.  Skin: Skin is warm and dry. No rash noted.   Cardiovascular: Normal heart rate noted  Respiratory: Normal respiratory effort, no problems with respiration noted  Abdomen: Soft, gravid, appropriate for gestational age.  Pain/Pressure: Present     Pelvic: Cervical exam deferred        Extremities: Normal range of motion.  Edema: Trace  Mental Status: Normal mood and affect. Normal behavior. Normal judgment and thought content.      08/08/2024    9:57 AM 06/12/2024    8:30 AM 06/09/2023   11:15 AM  Depression screen PHQ 2/9  Decreased  Interest 0 0 0  Down, Depressed, Hopeless 0 0 0  PHQ - 2 Score 0 0 0  Altered sleeping 1  0  Tired, decreased energy 1  0  Change in appetite 1  0  Feeling bad or failure about yourself  0  0  Trouble concentrating 0  0  Moving slowly or fidgety/restless 0  0  Suicidal thoughts 0  0  PHQ-9 Score 3   0   Difficult doing work/chores   Not difficult at all     Data saved with a previous flowsheet row definition        08/08/2024    9:57 AM  GAD 7 : Generalized Anxiety Score  Nervous, Anxious, on Edge 0  Control/stop worrying 1  Worry too much - different things 0  Trouble relaxing 1  Restless 0  Easily annoyed or irritable 1  Afraid - awful might happen 0  Total GAD 7 Score 3    Assessment and Plan:  Pregnancy: [id number] at [redacted]w[redacted]d 1. Supervision of high risk pregnancy, antepartum (Primary) Anticipatory guidance Normal fetal growth 12/26 Weekly NSTs after 34 wks  2. Diet controlled gestational diabetes mellitus (GDM) in third trimester No log today. Reports numbers improved, will send picture  3. History of cesarean delivery, currently pregnant Plans tolac  4. [redacted] weeks gestation of pregnancy   Preterm labor symptoms and general obstetric precautions including but not limited to vaginal bleeding, contractions, leaking of fluid and fetal movement were reviewed in detail with the patient. Please refer to After Visit Summary for other counseling recommendations.   No follow-ups on file.  Future Appointments  Date Time  Provider Department Center  01/01/2025  9:55 AM Barbra Lang PARAS, DO [facility] None  01/10/2025  2:15 PM [facility] PROVIDER 1 [facility]  01/10/2025  2:30 PM [facility] US3 [facility]  01/13/2025 10:15 AM Eliany Mccarter, Rollo DASEN, MD [facility] None  01/27/2025 10:15 AM Anyanwu, Gloris LABOR, MD [facility] None  02/05/2025 10:15 AM Barbra Lang PARAS, DO [facility] None  02/10/2025 10:15 AM Abigail, Rollo DASEN, MD [facility] None  06/13/2025  9:00 AM Daryl Setter, NP [id number]  Ferdie Rollo DASEN Abigail, MD "

## 2024-12-22 ENCOUNTER — Other Ambulatory Visit: Payer: Self-pay

## 2024-12-22 ENCOUNTER — Encounter (HOSPITAL_COMMUNITY): Payer: Self-pay | Admitting: Obstetrics and Gynecology

## 2024-12-22 ENCOUNTER — Inpatient Hospital Stay (HOSPITAL_COMMUNITY)
Admission: AD | Admit: 2024-12-22 | Discharge: 2024-12-22 | Disposition: A | Discharge status: Home / Self Care | Attending: Obstetrics and Gynecology | Admitting: Obstetrics and Gynecology

## 2024-12-22 DIAGNOSIS — O368130 Decreased fetal movements, third trimester, not applicable or unspecified: Secondary | ICD-10-CM | POA: Insufficient documentation

## 2024-12-22 DIAGNOSIS — R109 Unspecified abdominal pain: Secondary | ICD-10-CM | POA: Diagnosis not present

## 2024-12-22 DIAGNOSIS — O26893 Other specified pregnancy related conditions, third trimester: Secondary | ICD-10-CM | POA: Diagnosis not present

## 2024-12-22 DIAGNOSIS — Z3A32 32 weeks gestation of pregnancy: Secondary | ICD-10-CM | POA: Insufficient documentation

## 2024-12-22 LAB — URINALYSIS, ROUTINE W REFLEX MICROSCOPIC
Bilirubin Urine: NEGATIVE
Glucose, UA: NEGATIVE mg/dL
Hgb urine dipstick: NEGATIVE
Ketones, ur: 20 mg/dL — AB
Nitrite: NEGATIVE
Protein, ur: NEGATIVE mg/dL
Specific Gravity, Urine: 1.011 (ref 1.005–1.030)
pH: 7 (ref 5.0–8.0)

## 2024-12-22 NOTE — MAU Provider Note (Signed)
 Chief Complaint:  Decreased Fetal Movement and Abdominal Pain   Event Date/Time   First Provider Initiated Contact with Patient 12/22/24 1832      HPI: Kathryn Huff is a 34 y.o. H5E7987 at [redacted]w[redacted]d who presents to maternity admissions reporting decreased fetal movement and lower abdominal cramping, mainly on the left lower side.  Pain is worse with movement.  There is some constant pelvic pressure.   HPI  Past Medical History: Past Medical History:  Diagnosis Date   ADD (attention deficit disorder)    ADHD 2002   Anxiety    Back pain    Celiac disease 12/22/2017   Chest pain    Complication of anesthesia    Constipation    Depression    Fatty liver    GDM (gestational diabetes mellitus) 08/2024   Heart murmur    Liver function abnormality    HIGH   Migraines    Obesity    Polyphagia 10/25/2022   Prediabetes    Vitamin D  deficiency 2017    Past obstetric history: OB History  Gravida Para Term Preterm AB Living  4 2 2  0 1 2  SAB IAB Ectopic Multiple Live Births  1   1 2     # Outcome Date GA Lbr Len/2nd Weight Sex Type Anes PTL Lv  4A Gravida           4B Current           3 Term 03/25/21 [redacted]w[redacted]d  3714 g F CS-LTranv Spinal N LIV  2 Term 06/14/19 [redacted]w[redacted]d  3657 g M CS-LTranv EPI N LIV  1 SAB             Past Surgical History: Past Surgical History:  Procedure Laterality Date   CESAREAN SECTION  2020   CESAREAN SECTION  03/25/2021   MOUTH SURGERY  2012   two teeth surgically removed.      Family History: Family History  Problem Relation Age of Onset   Factor V Leiden deficiency Mother    Obesity Mother    Heart disease Mother    Diabetes Father    Hyperlipidemia Father    Sleep apnea Father    Obesity Father    Diabetes Sister 5       Type 1   Celiac disease Sister    Heart Problems Sister        left ventricular non-compaction   Diabetes Mellitus II Maternal Grandmother    Factor V Leiden deficiency Maternal Grandmother    Diabetes Mellitus II  Maternal Grandfather    Cancer Paternal Grandmother        breast   Breast cancer Paternal Grandmother    Cancer Paternal Grandfather        throat/throat and lung, smoker   Esophageal cancer Paternal Grandfather    Colon cancer Neg Hx    Colon polyps Neg Hx    Stomach cancer Neg Hx    Rectal cancer Neg Hx     Social History: Social History[1]  Allergies: Allergies[2]  Meds:  Medications Prior to Admission  Medication Sig Dispense Refill Last Dose/Taking   Accu-Chek Softclix Lancets lancets Use as instructed 100 each 12 12/21/2024   aspirin  EC 81 MG tablet Take 1 tablet (81 mg total) by mouth daily. Take after 12 weeks for prevention of preeclampsia later in pregnancy 300 tablet 2 12/22/2024   Blood Glucose Monitoring Suppl (ACCU-CHEK GUIDE) w/Device KIT 1 Device by Does not apply route 4 (four) times daily.  1 kit 0 12/21/2024   glucose blood test strip Use as instructed 100 each 12 12/21/2024   Prenatal Vit-Fe Fumarate-FA (MULTIVITAMIN-PRENATAL) 27-0.8 MG TABS tablet Take 1 tablet by mouth daily at 12 noon.   12/22/2024   acetaminophen  (TYLENOL ) 500 MG tablet Take 500 mg by mouth every 6 (six) hours as needed for mild pain. (Patient not taking: Reported on 12/16/2024)       ROS:  Review of Systems  Constitutional:  Negative for chills, fatigue and fever.  Eyes:  Negative for visual disturbance.  Respiratory:  Negative for shortness of breath.   Cardiovascular:  Negative for chest pain.  Gastrointestinal:  Positive for abdominal pain. Negative for nausea and vomiting.  Genitourinary:  Positive for pelvic pain. Negative for difficulty urinating, dysuria, flank pain, vaginal bleeding, vaginal discharge and vaginal pain.  Neurological:  Negative for dizziness and headaches.  Psychiatric/Behavioral: Negative.       I have reviewed patient's Past Medical Hx, Surgical Hx, Family Hx, Social Hx, medications and allergies.   Physical Exam  Patient Vitals for the past 24 hrs:  BP Pulse Resp  SpO2 Height Weight  12/22/24 1625 122/69 100 18 100 % 5' 9 (1.753 m) (!) 139.2 kg   Constitutional: Well-developed, well-nourished female in no acute distress.  Cardiovascular: normal rate Respiratory: normal effort GI: Abd soft, non-tender, gravid appropriate for gestational age.  MS: Extremities nontender, no edema, normal ROM Neurologic: Alert and oriented x 4.  GU: Neg CVAT.  PELVIC EXAM: Cervix pink, visually closed, without lesion, scant white creamy discharge, vaginal walls and external genitalia normal Bimanual exam: Cervix 0/long/high, firm, anterior, neg CMT, uterus nontender, nonenlarged, adnexa without tenderness, enlargement, or mass  Dilation: Closed Effacement (%): Thick Exam by:: Olam, CNM  FHT:  Baseline 150 , moderate variability, accelerations present, no decelerations Contractions: irregular, mild to palpation   Labs: Results for orders placed or performed during the hospital encounter of 12/22/24 (from the past 24 hours)  Urinalysis, Routine w reflex microscopic -Urine, Clean Catch     Status: Abnormal   Collection Time: 12/22/24  4:29 PM  Result Value Ref Range   Color, Urine YELLOW YELLOW   APPearance HAZY (A) CLEAR   Specific Gravity, Urine 1.011 1.005 - 1.030   pH 7.0 5.0 - 8.0   Glucose, UA NEGATIVE NEGATIVE mg/dL   Hgb urine dipstick NEGATIVE NEGATIVE   Bilirubin Urine NEGATIVE NEGATIVE   Ketones, ur 20 (A) NEGATIVE mg/dL   Protein, ur NEGATIVE NEGATIVE mg/dL   Nitrite NEGATIVE NEGATIVE   Leukocytes,Ua TRACE (A) NEGATIVE   RBC / HPF 0-5 0 - 5 RBC/hpf   WBC, UA 0-5 0 - 5 WBC/hpf   Bacteria, UA RARE (A) NONE SEEN   Squamous Epithelial / HPF 6-10 0 - 5 /HPF   Mucus PRESENT    O/Positive/-- (07/24 1457)  Imaging:    MAU Course/MDM: Orders Placed This Encounter  Procedures   Urinalysis, Routine w reflex microscopic -Urine, Clean Catch   Discharge patient Discharge disposition: 01-Home or Self Care; Discharge patient date: 12/22/2024    No  orders of the defined types were placed in this encounter.    NST reviewed and reactive Pt feeling normal fetal movement in MAU Cervix closed/thick/high, no evidence of preterm labor UA without evidence of UTI, with ketones, pt encouraged to increase PO fluids Pain with movement so some evidence of round ligament or MSK pain.  Rest/ice/heat/warm bath/increase PO fluids/Tylenol /pregnancy support belt  PTL precautions reviewed Keep scheduled appts at  [facility]  Assessment: 1. Decreased fetal movements in third trimester, single or unspecified fetus   2. Abdominal pain during pregnancy in third trimester   3. [redacted] weeks gestation of pregnancy     Plan: Discharge home Labor precautions and fetal kick counts  Follow-up Information     [facility] Follow up.   Specialty: Obstetrics and Gynecology Why: As scheduled Contact information: [address] [place]  [address] [phone]        [facility] Follow up.   Specialty: Obstetrics and Gynecology Why: As needed for emergencies Contact information: [address] [place]  [phone]               Allergies as of 12/22/2024       Reactions   Gluten Meal Other (See Comments), Diarrhea        Medication List     TAKE these medications    Accu-Chek Guide w/Device Kit 1 Device by Does not apply route 4 (four) times daily.   Accu-Chek Softclix Lancets lancets Use as instructed   acetaminophen  500 MG tablet Commonly known as: TYLENOL  Take 500 mg by mouth every 6 (six) hours as needed for mild pain.   aspirin  EC 81 MG tablet Take 1 tablet (81 mg total) by mouth daily. Take after 12 weeks for prevention of preeclampsia later in pregnancy   glucose blood test strip Use as instructed   multivitamin-prenatal 27-0.8 MG Tabs tablet Take 1 tablet by mouth daily at 12 noon.         Olam Boards Certified Nurse-Midwife 12/22/2024 6:48 PM      [1]  Social History Tobacco Use   Smoking status: Former    Types: Cigarettes   Smokeless tobacco: Never  Vaping Use   Vaping status: Never Used  Substance Use Topics   Alcohol use: Not Currently   Drug use: No  [2]  Allergies Allergen Reactions   Gluten Meal Other (See Comments) and Diarrhea

## 2024-12-22 NOTE — MAU Note (Signed)
 Kathryn Huff is a 34 y.o. at [redacted]w[redacted]d here in MAU reporting: woke up with lower abdominal pain mainly on the left. Felt like she was recovering from a csection- hurt to sit, stand and walk. Pain is worse with movement. Pressure in pelvic area. Left sided back pain that is worse when she is sitting. Bladder pain and pressure when her bladder is full.  DFM all day with minimal movements. States she is feeling fatigue and weak today. Denies any LOF or VB.   Onset of complaint: today  Pain score: 3 with movement 7 Vitals:   12/22/24 1625  BP: 122/69  Pulse: 100  Resp: 18  SpO2: 100%     FHT:158 Lab orders placed from triage:  UA

## 2024-12-23 ENCOUNTER — Inpatient Hospital Stay (HOSPITAL_COMMUNITY)

## 2024-12-23 ENCOUNTER — Encounter (HOSPITAL_COMMUNITY): Payer: Self-pay | Admitting: Obstetrics & Gynecology

## 2024-12-23 ENCOUNTER — Inpatient Hospital Stay (HOSPITAL_COMMUNITY)
Admission: AD | Admit: 2024-12-23 | Discharge: 2024-12-23 | Disposition: A | Discharge status: Home / Self Care | Attending: Obstetrics & Gynecology | Admitting: Obstetrics & Gynecology

## 2024-12-23 DIAGNOSIS — O2343 Unspecified infection of urinary tract in pregnancy, third trimester: Secondary | ICD-10-CM | POA: Insufficient documentation

## 2024-12-23 DIAGNOSIS — R1031 Right lower quadrant pain: Secondary | ICD-10-CM | POA: Diagnosis not present

## 2024-12-23 DIAGNOSIS — O99113 Other diseases of the blood and blood-forming organs and certain disorders involving the immune mechanism complicating pregnancy, third trimester: Secondary | ICD-10-CM | POA: Insufficient documentation

## 2024-12-23 DIAGNOSIS — O26893 Other specified pregnancy related conditions, third trimester: Secondary | ICD-10-CM | POA: Diagnosis present

## 2024-12-23 DIAGNOSIS — Z3A32 32 weeks gestation of pregnancy: Secondary | ICD-10-CM | POA: Diagnosis not present

## 2024-12-23 DIAGNOSIS — D72829 Elevated white blood cell count, unspecified: Secondary | ICD-10-CM | POA: Diagnosis not present

## 2024-12-23 DIAGNOSIS — N390 Urinary tract infection, site not specified: Secondary | ICD-10-CM | POA: Diagnosis not present

## 2024-12-23 DIAGNOSIS — R509 Fever, unspecified: Secondary | ICD-10-CM | POA: Diagnosis not present

## 2024-12-23 LAB — CBC WITH DIFFERENTIAL/PLATELET
Abs Immature Granulocytes: 0.07 K/uL (ref 0.00–0.07)
Basophils Absolute: 0 K/uL (ref 0.0–0.1)
Basophils Relative: 0 %
Eosinophils Absolute: 0 K/uL (ref 0.0–0.5)
Eosinophils Relative: 0 %
HCT: 38.5 % (ref 36.0–46.0)
Hemoglobin: 12.6 g/dL (ref 12.0–15.0)
Immature Granulocytes: 1 %
Lymphocytes Relative: 3 %
Lymphs Abs: 0.4 K/uL — ABNORMAL LOW (ref 0.7–4.0)
MCH: 29.1 pg (ref 26.0–34.0)
MCHC: 32.7 g/dL (ref 30.0–36.0)
MCV: 88.9 fL (ref 80.0–100.0)
Monocytes Absolute: 0.6 K/uL (ref 0.1–1.0)
Monocytes Relative: 5 %
Neutro Abs: 12.3 K/uL — ABNORMAL HIGH (ref 1.7–7.7)
Neutrophils Relative %: 91 %
Platelets: 157 K/uL (ref 150–400)
RBC: 4.33 MIL/uL (ref 3.87–5.11)
RDW: 14.2 % (ref 11.5–15.5)
WBC: 13.4 K/uL — ABNORMAL HIGH (ref 4.0–10.5)
nRBC: 0 % (ref 0.0–0.2)

## 2024-12-23 LAB — URINALYSIS, ROUTINE W REFLEX MICROSCOPIC
Bilirubin Urine: NEGATIVE
Glucose, UA: NEGATIVE mg/dL
Hgb urine dipstick: NEGATIVE
Ketones, ur: 80 mg/dL — AB
Leukocytes,Ua: NEGATIVE
Nitrite: POSITIVE — AB
Protein, ur: 100 mg/dL — AB
Specific Gravity, Urine: 1.021 (ref 1.005–1.030)
pH: 6 (ref 5.0–8.0)

## 2024-12-23 LAB — COMPREHENSIVE METABOLIC PANEL WITH GFR
ALT: 18 U/L (ref 0–44)
AST: 19 U/L (ref 15–41)
Albumin: 3.7 g/dL (ref 3.5–5.0)
Alkaline Phosphatase: 111 U/L (ref 38–126)
Anion gap: 14 (ref 5–15)
BUN: 8 mg/dL (ref 6–20)
CO2: 17 mmol/L — ABNORMAL LOW (ref 22–32)
Calcium: 8.8 mg/dL — ABNORMAL LOW (ref 8.9–10.3)
Chloride: 103 mmol/L (ref 98–111)
Creatinine, Ser: 0.75 mg/dL (ref 0.44–1.00)
GFR, Estimated: >60 mL/min
Glucose, Bld: 96 mg/dL (ref 70–99)
Potassium: 4.2 mmol/L (ref 3.5–5.1)
Sodium: 134 mmol/L — ABNORMAL LOW (ref 135–145)
Total Bilirubin: 0.8 mg/dL (ref 0.0–1.2)
Total Protein: 7 g/dL (ref 6.5–8.1)

## 2024-12-23 LAB — PROTEIN / CREATININE RATIO, URINE
Creatinine, Urine: 168 mg/dL
Protein Creatinine Ratio: 0.3 mg/mg — ABNORMAL HIGH
Total Protein, Urine: 56 mg/dL

## 2024-12-23 LAB — TYPE AND SCREEN
ABO/RH(D): O POS
Antibody Screen: NEGATIVE

## 2024-12-23 LAB — RESP PANEL BY RT-PCR (RSV, FLU A&B, COVID)  RVPGX2
Influenza A by PCR: NEGATIVE
Influenza B by PCR: NEGATIVE
Resp Syncytial Virus by PCR: NEGATIVE
SARS Coronavirus 2 by RT PCR: NEGATIVE

## 2024-12-23 LAB — LACTIC ACID, PLASMA: Lactic Acid, Venous: 1.4 mmol/L (ref 0.5–1.9)

## 2024-12-23 MED ORDER — METOCLOPRAMIDE HCL 5 MG/ML IJ SOLN
10.0000 mg | Freq: Once | INTRAMUSCULAR | Status: AC
  Administered 2024-12-23: 10 mg via INTRAVENOUS
  Filled 2024-12-23: qty 2

## 2024-12-23 MED ORDER — IOHEXOL 350 MG/ML SOLN
75.0000 mL | Freq: Once | INTRAVENOUS | Status: AC | PRN
  Administered 2024-12-23: 75 mL via INTRAVENOUS

## 2024-12-23 MED ORDER — LACTATED RINGERS IV BOLUS
1000.0000 mL | Freq: Once | INTRAVENOUS | Status: AC
  Administered 2024-12-23: 1000 mL via INTRAVENOUS

## 2024-12-23 MED ORDER — HYDROMORPHONE HCL 1 MG/ML IJ SOLN
1.0000 mg | Freq: Once | INTRAMUSCULAR | Status: AC
  Administered 2024-12-23: 1 mg via INTRAVENOUS
  Filled 2024-12-23: qty 1

## 2024-12-23 MED ORDER — ACETAMINOPHEN 500 MG PO TABS
1000.0000 mg | ORAL_TABLET | Freq: Once | ORAL | Status: AC
  Administered 2024-12-23: 1000 mg via ORAL
  Filled 2024-12-23: qty 2

## 2024-12-23 MED ORDER — SODIUM CHLORIDE 0.9 % IV SOLN: INTRAVENOUS | Status: DC

## 2024-12-23 MED ORDER — CEFDINIR 300 MG PO CAPS: 300.0000 mg | ORAL_CAPSULE | Freq: Two times a day (BID) | ORAL | 0 refills | Status: AC

## 2024-12-23 MED ORDER — SODIUM CHLORIDE 0.9 % IV SOLN
2.0000 g | Freq: Once | INTRAVENOUS | Status: AC
  Administered 2024-12-23: 2 g via INTRAVENOUS
  Filled 2024-12-23: qty 20

## 2024-12-23 NOTE — MAU Provider Note (Addendum)
 " History     CSN: [id number]  Arrival date and time: 12/23/24 1700   Event Date/Time   First Provider Initiated Contact with Patient 12/23/24 1719      No chief complaint on file.  Kathryn Huff is a 34 y.o. female [phone] at [redacted]w[redacted]d presenting to MAU with chief complaint of sudden onset acute sharp, stabbing RLQ abdominal pain. Patient reports that her pain was sudden onset this morning and has been constant since. She currently rates her pain as a 7/10 at rest which increases to a 9/10 with movement and 10/10 with fetal movement. Patient's husband notes that patient experienced worsened pain when driving over bumps en route. She reports associated nausea and vomiting. Patient notes that she was seen yesterday for bilateral lower abdominal pain but states that her pain today is new and different. 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 Denies vaginal bleeding or discharge, LOF.    OB History     Gravida  4   Para  2   Term  2   Preterm  0   AB  1   Living  2      SAB  1   IAB      Ectopic      Multiple      Live Births  2           Past Medical History:  Diagnosis Date   ADD (attention deficit disorder)    ADHD 2002   Anxiety    Back pain    Celiac disease 12/22/2017   Chest pain    Complication of anesthesia    Constipation    Depression    Fatty liver    GDM (gestational diabetes mellitus) 08/2024   Heart murmur    Liver function abnormality    HIGH   Migraines    Obesity    Polyphagia 10/25/2022   Prediabetes    Vitamin D  deficiency 2017    Past Surgical History:  Procedure Laterality Date   CESAREAN SECTION  2020   CESAREAN SECTION  03/25/2021   MOUTH SURGERY  2012   two teeth surgically removed.      Family History  Problem Relation Age of Onset   Factor V Leiden deficiency Mother    Obesity Mother    Heart disease Mother    Diabetes Father    Hyperlipidemia Father    Sleep apnea Father    Obesity  Father    Diabetes Sister 5       Type 1   Celiac disease Sister    Heart Problems Sister        left ventricular non-compaction   Diabetes Mellitus II Maternal Grandmother    Factor V Leiden deficiency Maternal Grandmother    Diabetes Mellitus II Maternal Grandfather    Cancer Paternal Grandmother        breast   Breast cancer Paternal Grandmother    Cancer Paternal Grandfather        throat/throat and lung, smoker   Esophageal cancer Paternal Grandfather    Colon cancer Neg Hx    Colon polyps Neg Hx    Stomach cancer Neg Hx    Rectal cancer Neg Hx     Social History[1]  Allergies: Allergies[2]  Medications Prior to Admission  Medication Sig Dispense Refill Last Dose/Taking   Accu-Chek Softclix Lancets lancets Use as instructed 100 each 12    acetaminophen  (TYLENOL ) 500 MG tablet Take 500 mg by  mouth every 6 (six) hours as needed for mild pain. (Patient not taking: Reported on 12/16/2024)      aspirin  EC 81 MG tablet Take 1 tablet (81 mg total) by mouth daily. Take after 12 weeks for prevention of preeclampsia later in pregnancy 300 tablet 2    Blood Glucose Monitoring Suppl (ACCU-CHEK GUIDE) w/Device KIT 1 Device by Does not apply route 4 (four) times daily. 1 kit 0    glucose blood test strip Use as instructed 100 each 12    Prenatal Vit-Fe Fumarate-FA (MULTIVITAMIN-PRENATAL) 27-0.8 MG TABS tablet Take 1 tablet by mouth daily at 12 noon.       Review of Systems  Constitutional:  Positive for fever.  HENT:  Negative for congestion.   Eyes:  Negative for visual disturbance.  Respiratory:  Negative for cough.   Cardiovascular:  Negative for chest pain.  Gastrointestinal:  Positive for abdominal pain, nausea and vomiting. Negative for diarrhea.  Genitourinary:  Negative for dysuria, flank pain, vaginal bleeding and vaginal discharge.  Neurological:  Negative for dizziness and headaches.   Physical Exam   Blood pressure (!) 127/57, pulse (!) 119, temperature (!) 100.7 F  (38.2 C), resp. rate (!) 25, last menstrual period 05/12/2024, SpO2 97%, unknown if currently breastfeeding.  Physical Exam Constitutional:      General: She is in acute distress.  Cardiovascular:     Rate and Rhythm: Regular rhythm. Tachycardia present.     Heart sounds: Normal heart sounds.  Pulmonary:     Effort: Pulmonary effort is normal. No respiratory distress.  Abdominal:     Tenderness: There is abdominal tenderness (RLQ). There is no right CVA tenderness, left CVA tenderness or rebound.  Genitourinary:    Comments: Cervix closed.  Skin:    General: Skin is warm and dry.  Neurological:     Mental Status: She is alert.    MAU Course  Procedures  MDM 34 y.o. female [phone] at [redacted]w[redacted]d with hx of C-section x2 presenting with acute RLQ abdominal pain associated with nausea and vomiting. Physical exam remarkable for febrile patient with significant RLQ tenderness to palpation without rebound tenderness or CVA tenderness. UA, CBC, CMP, lactic acid, respiratory swab, type and screen, urine protein/creatinine ratio, and blood cultures x2 obtained. Tylenol  given for fever as well as 1L LR bolus, Reglan , and Dilaudid . CT abdomen/pelvis with contrast obtained due to concern for acute appendicitis. UA consistent with UTI. CBC remarkable for mild leukocytosis. Rocephin  and NS ordered. Lactic acid WNL. Pain improved with medications. NST reassuring during stay. Pt signed out to oncoming provider with blood cultures, CT results, and respiratory panel pending.   Assessment and Plan  Abdominal pain in pregnancy  Labs obtained and CT pending, as above. Mild leukocytosis noted. Disposition per CT results.  2.   Urinary tract infection in pregnancy  UA remarkable for UTI. Rocephin  and fluids given. No CVA tenderness on exam.   Franchot Budge 12/23/2024, 5:27 PM    Attestation of Supervision of Student:  I confirm that I have verified the information documented in the physician assistant  students note and that I have also personally performed the history, physical exam and all medical decision making activities.  I have verified that all services and findings are accurately documented in this student's note; and I agree with management and plan as outlined in the documentation. I have also made any necessary editorial changes.   Care turned over to Dr. Magali Rocky Satterfield NP 12/23/2024 8:20 PM   -  PCR respiratory panel negative -U/A consistent with E. Coli UTI. Ceftriaxone  given in the MAU. Ucx and Bcx pending -Lactic acid normal -No evidence of end organ damage on CMP -CBC with no evidence of anemia -CT abdomen with no acute abdominal process identified -No CVA tenderness on exam  -Stable for discharge home -Likely culprit UTI in setting of dehydration -Ceftriaxone  given in MAU. Will continue antibiotics outpatient. Rx sent for 5 day course of omnicef .  -Will f/u on urine culture -All questions answered, anticipatory guidance and detailed return precautions provided.  Barkley Angles, MD OB Fellow, Faculty [facility], [facility] [employer]       [1]  Social History Tobacco Use   Smoking status: Former    Types: Cigarettes   Smokeless tobacco: Never  Vaping Use   Vaping status: Never Used  Substance Use Topics   Alcohol use: Not Currently   Drug use: No  [2]  Allergies Allergen Reactions   Gluten Meal Other (See Comments) and Diarrhea   "

## 2024-12-23 NOTE — MAU Note (Signed)
 Kathryn Huff is a 34 y.o. at [redacted]w[redacted]d here in MAU reporting: SOB, RLQ abd pain, especially painful when moving, being touched or baby moving. Fever on and off all day at home. Here last night with lower abd pain all over but at 12N went straight to right side significantly worse and has not stopped. She reports +FMs, Denies LOF, VB, regular contractions, blurry vision, headaches, peripheral edema, or RUQ pain.   LMP: -- Onset of complaint: 1200 Pain score: 9/10 when moving There were no vitals filed for this visit.   FHT: 190  Lab orders placed from triage: ua

## 2024-12-25 LAB — CULTURE, OB URINE: Culture: >=100000 — AB

## 2024-12-28 LAB — CULTURE, BLOOD (ROUTINE X 2)
Culture: NO GROWTH
Culture: NO GROWTH
Special Requests: ADEQUATE
Special Requests: ADEQUATE

## 2025-01-01 ENCOUNTER — Ambulatory Visit: Admitting: Family Medicine

## 2025-01-01 VITALS — BP 117/62 | HR 91 | Wt 299.0 lb

## 2025-01-01 DIAGNOSIS — Z98891 History of uterine scar from previous surgery: Secondary | ICD-10-CM | POA: Diagnosis not present

## 2025-01-01 DIAGNOSIS — F411 Generalized anxiety disorder: Secondary | ICD-10-CM

## 2025-01-01 DIAGNOSIS — O0990 Supervision of high risk pregnancy, unspecified, unspecified trimester: Secondary | ICD-10-CM | POA: Diagnosis not present

## 2025-01-01 DIAGNOSIS — Z6841 Body Mass Index (BMI) 40.0 and over, adult: Secondary | ICD-10-CM | POA: Diagnosis not present

## 2025-01-01 DIAGNOSIS — Z3A33 33 weeks gestation of pregnancy: Secondary | ICD-10-CM

## 2025-01-01 DIAGNOSIS — O24410 Gestational diabetes mellitus in pregnancy, diet controlled: Secondary | ICD-10-CM | POA: Diagnosis not present

## 2025-01-01 NOTE — Progress Notes (Signed)
 "  PRENATAL VISIT NOTE  Subjective:  Kathryn Huff is a 34 y.o. H5E7987 at [redacted]w[redacted]d being seen today for ongoing prenatal care.  She is currently monitored for the following issues for this high-risk pregnancy and has ADHD (attention deficit hyperactivity disorder); Insulin  resistance; Depression; Vitamin D  deficiency; Celiac disease; Pre-existing severe obesity in mother affecting pregnancy (HCC); Anxiety; GAD (generalized anxiety disorder); Obesity complicating pregnancy in second trimester; Elevated alkaline phosphatase level; Complication of anesthesia (difficult epidural); Supervision of high risk pregnancy, antepartum; History of 2 cesarean sections; and Gestational diabetes mellitus in pregnancy, diet controlled on their problem list.  Patient reports no complaints.  Contractions: Not present. Vag. Bleeding: None.  Movement: Present. Denies leaking of fluid.   The following portions of the patient's history were reviewed and updated as appropriate: allergies, current medications, past family history, past medical history, past social history, past surgical history and problem list.   Objective:   Vitals:   01/01/25 1020  BP: 117/62  Pulse: 91  Weight: 299 lb (135.6 kg)    Fetal Status:      Movement: Present    General: Alert, oriented and cooperative. Patient is in no acute distress.  Skin: Skin is warm and dry. No rash noted.   Cardiovascular: Normal heart rate noted  Respiratory: Normal respiratory effort, no problems with respiration noted  Abdomen: Soft, gravid, appropriate for gestational age.  Pain/Pressure: Absent     Pelvic: Cervical exam deferred        Extremities: Normal range of motion.  Edema: None  Mental Status: Normal mood and affect. Normal behavior. Normal judgment and thought content.      08/08/2024    9:57 AM 06/12/2024    8:30 AM 06/09/2023   11:15 AM  Depression screen PHQ 2/9  Decreased Interest 0 0 0  Down, Depressed, Hopeless 0 0 0  PHQ - 2  Score 0 0 0  Altered sleeping 1  0  Tired, decreased energy 1  0  Change in appetite 1  0  Feeling bad or failure about yourself  0  0  Trouble concentrating 0  0  Moving slowly or fidgety/restless 0  0  Suicidal thoughts 0  0  PHQ-9 Score 3   0   Difficult doing work/chores   Not difficult at all     Data saved with a previous flowsheet row definition        08/08/2024    9:57 AM  GAD 7 : Generalized Anxiety Score  Nervous, Anxious, on Edge 0  Control/stop worrying 1  Worry too much - different things 0  Trouble relaxing 1  Restless 0  Easily annoyed or irritable 1  Afraid - awful might happen 0  Total GAD 7 Score 3    Assessment and Plan:  Pregnancy: [id number] at [redacted]w[redacted]d 1. [redacted] weeks gestation of pregnancy (Primary)  2. Supervision of high risk pregnancy, antepartum FHT normal  3. Diet controlled gestational diabetes mellitus (GDM) in third trimester Missed a few days while she was sick.  CBGs normal Continue Serial US  for growth.  4. History of 2 cesarean sections Desires TOLAC.  5. GAD (generalized anxiety disorder)   6. BMI 40.0-44.9, adult (HCC)   Preterm labor symptoms and general obstetric precautions including but not limited to vaginal bleeding, contractions, leaking of fluid and fetal movement were reviewed in detail with the patient. Please refer to After Visit Summary for other counseling recommendations.   No follow-ups on file.  Future Appointments  Date Time Provider Department Center  01/10/2025  2:15 PM [facility] PROVIDER 1 [facility]  01/10/2025  2:30 PM [facility] US3 [facility]  01/13/2025 10:15 AM Dunn, Rollo DASEN, MD [facility] None  01/27/2025 10:15 AM Anyanwu, Gloris LABOR, MD [facility] None  02/05/2025 10:35 AM Abigail, Rollo DASEN, MD [facility] None  02/10/2025 10:15 AM Abigail, Rollo DASEN, MD [facility] None  06/13/2025  9:00 AM Daryl Setter, NP [facility] [id number]    Daveah Varone J Masha Orbach, DO "

## 2025-01-10 ENCOUNTER — Other Ambulatory Visit: Payer: Self-pay | Admitting: Obstetrics and Gynecology

## 2025-01-10 ENCOUNTER — Ambulatory Visit (HOSPITAL_BASED_OUTPATIENT_CLINIC_OR_DEPARTMENT_OTHER): Admitting: Obstetrics

## 2025-01-10 ENCOUNTER — Ambulatory Visit: Attending: Obstetrics and Gynecology

## 2025-01-10 VITALS — BP 110/65 | HR 80

## 2025-01-10 DIAGNOSIS — O99213 Obesity complicating pregnancy, third trimester: Secondary | ICD-10-CM

## 2025-01-10 DIAGNOSIS — E669 Obesity, unspecified: Secondary | ICD-10-CM

## 2025-01-10 DIAGNOSIS — Z3A35 35 weeks gestation of pregnancy: Secondary | ICD-10-CM

## 2025-01-10 DIAGNOSIS — O24410 Gestational diabetes mellitus in pregnancy, diet controlled: Secondary | ICD-10-CM | POA: Insufficient documentation

## 2025-01-10 DIAGNOSIS — O99212 Obesity complicating pregnancy, second trimester: Secondary | ICD-10-CM | POA: Diagnosis present

## 2025-01-10 DIAGNOSIS — O358XX0 Maternal care for other (suspected) fetal abnormality and damage, not applicable or unspecified: Secondary | ICD-10-CM | POA: Insufficient documentation

## 2025-01-10 DIAGNOSIS — O34219 Maternal care for unspecified type scar from previous cesarean delivery: Secondary | ICD-10-CM

## 2025-01-10 NOTE — Progress Notes (Signed)
 MFM Consult Note  Kathryn Huff is currently at [redacted]w[redacted]d. She has been followed due to maternal obesity with a BMI of 43.2 and diet-controlled gestational diabetes.    She denies any problems since her last exam and reports that her fingerstick values have mostly been within normal limits.    Her blood pressure was 110/65.  Sonographic findings Single intrauterine pregnancy at 35w 1d. Fetal cardiac activity: Observed. Presentation: Cephalic. Fetal biometry shows the estimated fetal weight of 5 lb 5 oz,  2408g (26%). Amniotic fluid: Within normal limits. AFI: 17.5 cm.  MVP: 4.96 cm. Placenta: Posterior. BPP: 8/8.   Due to maternal obesity and gestational diabetes, she should continue weekly fetal testing until delivery.    Delivery should be considered at around 39 weeks.    She will return in 1 week for an NST.  The patient stated that all of her questions were answered.   A total of 20 minutes was spent counseling and coordinating the care for this patient.  Greater than 50% of the time was spent in direct face-to-face contact.

## 2025-01-13 ENCOUNTER — Encounter: Admitting: Obstetrics and Gynecology

## 2025-01-15 ENCOUNTER — Ambulatory Visit: Admitting: Obstetrics and Gynecology

## 2025-01-15 VITALS — BP 115/60 | HR 96 | Wt 299.0 lb

## 2025-01-15 DIAGNOSIS — O99210 Obesity complicating pregnancy, unspecified trimester: Secondary | ICD-10-CM | POA: Diagnosis not present

## 2025-01-15 DIAGNOSIS — O24410 Gestational diabetes mellitus in pregnancy, diet controlled: Secondary | ICD-10-CM | POA: Diagnosis not present

## 2025-01-15 DIAGNOSIS — Z0289 Encounter for other administrative examinations: Secondary | ICD-10-CM

## 2025-01-15 DIAGNOSIS — O0990 Supervision of high risk pregnancy, unspecified, unspecified trimester: Secondary | ICD-10-CM

## 2025-01-15 DIAGNOSIS — O34219 Maternal care for unspecified type scar from previous cesarean delivery: Secondary | ICD-10-CM | POA: Diagnosis not present

## 2025-01-15 NOTE — Progress Notes (Signed)
" ° °  PRENATAL VISIT NOTE  Subjective:  Kathryn Huff is a 34 y.o. [id number] at [redacted]w[redacted]d being seen today for ongoing prenatal care.  She is currently monitored for the following issues for this high-risk pregnancy and has ADHD (attention deficit hyperactivity disorder); Insulin  resistance; Depression; Vitamin D  deficiency; Celiac disease; Pre-existing severe obesity in mother affecting pregnancy (HCC); Anxiety; GAD (generalized anxiety disorder); Obesity complicating pregnancy in second trimester; Elevated alkaline phosphatase level; Complication of anesthesia (difficult epidural); Supervision of high risk pregnancy, antepartum; History of 2 cesarean sections; and Gestational diabetes mellitus in pregnancy, diet controlled on their problem list.  Patient reports no complaints. Notes a rash on her wrist, behind her knee and on her arm that is improving. Somewhat itchy. Contractions: Not present. Vag. Bleeding: None.  Movement: Present. Denies leaking of fluid.   The following portions of the patient's history were reviewed and updated as appropriate: allergies, current medications, past family history, past medical history, past social history, past surgical history and problem list.   Objective:   Vitals:   01/15/25 1425  BP: 115/60  Pulse: 96  Weight: 299 lb (135.6 kg)    Fetal Status:  Fetal Heart Rate (bpm): 135   Movement: Present    NST REACTIVE FHR: 135, moderate variability, +accels, no decels Toco: quiet  General: Alert, oriented and cooperative. Patient is in no acute distress.  Skin: Skin is warm and dry. No rash noted.   Cardiovascular: Normal heart rate noted  Respiratory: Normal respiratory effort, no problems with respiration noted  Abdomen: Soft, gravid, appropriate for gestational age.  Pain/Pressure: Absent     Pelvic: Cervical exam deferred        Extremities: Normal range of motion.  Edema: None  Mental Status: Normal mood and affect. Normal behavior. Normal judgment  and thought content.     Assessment and Plan:  Pregnancy: [id number] at [redacted]w[redacted]d 1. Supervision of high risk pregnancy, antepartum (Primary) Anticipatory guidance Hydrocortisone prn rash, though it is improving GBS next visit Never completed third trimester HIV/RPR, will complete now - Fetal nonstress test - HIV Antibody (routine testing w rflx) - RPR W/RFLX TO RPR TITER, TREPONEMAL AB, SCREEN AND DIAGNOSIS  2. Diet controlled gestational diabetes mellitus (GDM) in third trimester Reports good control, rare elevations Last growth 1/23 EFW 26%, BPP 8/8 IOL per MFM at 39 weeks, given TOLAC x 2, will schedule for latter half of that week  3. History of cesarean delivery, currently pregnant Hx c-section x 2, plans TOLAC, consent signed 11/27/24  4. Obesity during pregnancy Last growth as above Weekly antenatal testing NST today   Preterm labor symptoms and general obstetric precautions including but not limited to vaginal bleeding, contractions, leaking of fluid and fetal movement were reviewed in detail with the patient. Please refer to After Visit Summary for other counseling recommendations.   No follow-ups on file.  Future Appointments  Date Time Provider Department Center  01/27/2025 10:15 AM Herchel, Gloris LABOR, MD [facility] None  02/05/2025 10:35 AM Abigail, Rollo DASEN, MD [facility] None  02/10/2025 10:15 AM Abigail, Rollo DASEN, MD [facility] None  06/13/2025  9:00 AM Daryl Setter, NP [facility] [id number] Rollo DASEN Abigail, MD "

## 2025-01-16 ENCOUNTER — Ambulatory Visit: Payer: Self-pay | Admitting: Obstetrics and Gynecology

## 2025-01-16 LAB — HIV ANTIBODY (ROUTINE TESTING W REFLEX): HIV Screen 4th Generation wRfx: NONREACTIVE

## 2025-01-16 LAB — SYPHILIS: RPR W/REFLEX TO RPR TITER AND TREPONEMAL ANTIBODIES, TRADITIONAL SCREENING AND DIAGNOSIS ALGORITHM: RPR Ser Ql: NONREACTIVE

## 2025-01-21 ENCOUNTER — Encounter: Payer: Self-pay | Admitting: Family Medicine

## 2025-01-24 ENCOUNTER — Inpatient Hospital Stay (HOSPITAL_COMMUNITY)
Admission: AD | Admit: 2025-01-24 | Disposition: A | Discharge status: Home / Self Care | Source: Home / Self Care | Admitting: Obstetrics and Gynecology

## 2025-01-24 ENCOUNTER — Encounter (HOSPITAL_COMMUNITY): Payer: Self-pay | Admitting: Obstetrics and Gynecology

## 2025-01-24 DIAGNOSIS — R519 Headache, unspecified: Secondary | ICD-10-CM

## 2025-01-24 DIAGNOSIS — Z3689 Encounter for other specified antenatal screening: Secondary | ICD-10-CM

## 2025-01-24 DIAGNOSIS — Z3A37 37 weeks gestation of pregnancy: Secondary | ICD-10-CM

## 2025-01-24 MED ORDER — ACETAMINOPHEN-CAFFEINE 500-65 MG PO TABS
2.0000 | ORAL_TABLET | Freq: Once | ORAL | Status: AC
  Administered 2025-01-24: 2 via ORAL
  Filled 2025-01-24: qty 2

## 2025-01-24 NOTE — MAU Provider Note (Cosign Needed Addendum)
 Chief Complaint:  Headache and Contractions  HPI   Event Date/Time   First Provider Initiated Contact with Patient 01/24/25 2137     Kathryn Huff is a 34 y.o. H5E7987 at [redacted]w[redacted]d who presents to maternity admissions reporting headache, backache and contractions. She has a history of migraines, this does not feel like one as she is not having any visual disturbances or aura, just pain on the front left side of her forehead. Had a contraction when she arrived to MAU, but none since. Lower back hurts with contractions, but not constant. No other physical complaints, denies vaginal bleeding/discharge, LOF or DFM.   Pregnancy Course: Receives care at [facility], prenatal records reviewed.  Past Medical History:  Diagnosis Date   ADD (attention deficit disorder)    ADHD 2002   Anxiety    Back pain    Celiac disease 12/22/2017   Chest pain    Complication of anesthesia    Constipation    Depression    Fatty liver    GDM (gestational diabetes mellitus) 08/2024   Heart murmur    Liver function abnormality    HIGH   Migraines    Obesity    Polyphagia 10/25/2022   Prediabetes    Vitamin D  deficiency 2017   OB History  Gravida Para Term Preterm AB Living  4 2 2  0 1 2  SAB IAB Ectopic Multiple Live Births  1    2    # Outcome Date GA Lbr Len/2nd Weight Sex Type Anes PTL Lv  4 Current           3 Term 03/25/21 [redacted]w[redacted]d  7 lb 6.5 oz (3.36 kg) F CS-LTranv Spinal N LIV  2 SAB 02/06/20 [redacted]w[redacted]d            Birth Comments: D&C  1 Term 06/14/19 [redacted]w[redacted]d  7 lb 0.5 oz (3.19 kg) M CS-LTranv EPI N LIV   Past Surgical History:  Procedure Laterality Date   CESAREAN SECTION  2020   CESAREAN SECTION  03/25/2021   MOUTH SURGERY  2012   two teeth surgically removed.     Family History  Problem Relation Age of Onset   Factor V Leiden deficiency Mother    Obesity Mother    Heart disease Mother    Diabetes Father    Hyperlipidemia Father    Sleep apnea Father    Obesity Father    Diabetes Sister 5        Type 1   Celiac disease Sister    Heart Problems Sister        left ventricular non-compaction   Diabetes Mellitus II Maternal Grandmother    Factor V Leiden deficiency Maternal Grandmother    Diabetes Mellitus II Maternal Grandfather    Cancer Paternal Grandmother        breast   Breast cancer Paternal Grandmother    Cancer Paternal Grandfather        throat/throat and lung, smoker   Esophageal cancer Paternal Grandfather    Colon cancer Neg Hx    Colon polyps Neg Hx    Stomach cancer Neg Hx    Rectal cancer Neg Hx    Social History[1] Allergies[2] Medications Prior to Admission  Medication Sig Dispense Refill Last Dose/Taking   aspirin  EC 81 MG tablet Take 1 tablet (81 mg total) by mouth daily. Take after 12 weeks for prevention of preeclampsia later in pregnancy 300 tablet 2 01/24/2025   Prenatal Vit-Fe Fumarate-FA (MULTIVITAMIN-PRENATAL) 27-0.8 MG TABS  tablet Take 1 tablet by mouth daily at 12 noon.   01/24/2025   Accu-Chek Softclix Lancets lancets Use as instructed 100 each 12    acetaminophen  (TYLENOL ) 500 MG tablet Take 500 mg by mouth every 6 (six) hours as needed for mild pain.   More than a month   Blood Glucose Monitoring Suppl (ACCU-CHEK GUIDE) w/Device KIT 1 Device by Does not apply route 4 (four) times daily. 1 kit 0    glucose blood test strip Use as instructed 100 each 12    I have reviewed patient's Past Medical Hx, Surgical Hx, Family Hx, Social Hx, medications and allergies.   ROS  Pertinent items noted in HPI and remainder of comprehensive ROS otherwise negative.   PHYSICAL EXAM  Patient Vitals for the past 24 hrs:  BP Temp Temp src Pulse Resp SpO2 Height Weight  01/24/25 2130 119/79 -- -- 83 -- 100 % -- --  01/24/25 2111 115/62 97.7 F (36.5 C) Oral 86 12 -- 5' 9 (1.753 m) (!) 303 lb 1.6 oz (137.5 kg)   Constitutional: Well-developed, well-nourished female in no acute distress.  Cardiovascular: normal rate & rhythm, warm and  well-perfused Respiratory: normal effort, no problems with respiration noted GI: Abd soft, non-tender, non-distended MS: Extremities nontender, no edema, normal ROM Neurologic: Alert and oriented x 4.  GU: no CVA tenderness Pelvic: normal external female genitalia, physiologic discharge, no blood, cervix clean.   Dilation: Fingertip Effacement (%): Thick Cervical Position: Posterior Exam by:: DOROTHA Finder, CNM  Fetal Tracing: reactive Baseline: 135 Variability: moderate Accelerations: 15x15 Decelerations: none Toco: very occasional, mild, mostly UI   Labs: No results found for this or any previous visit (from the past 24 hours).  Imaging:  No results found.  ASSESSMENT & PLAN  MDM: Moderate  MAU Course: Orders Placed This Encounter  Procedures   Discharge patient   Meds ordered this encounter  Medications   acetaminophen -caffeine  (EXCEDRIN TENSION HEADACHE) 500-65 MG per tablet 2 tablet   Cervical exam performed, not laboring, only had 1-2 other contractions while waiting on headache to abate.  Headache resolved with excedrin, BP normal throughout. Stable for discharge home.  DISCHARGE DIAGNOSIS  1. Pregnancy headache in third trimester (Primary) - Discharge patient  2. [redacted] weeks gestation of pregnancy  3. NST (non-stress test) reactive   DISCHARGE PLAN  Discharge home in stable condition with return precautions.     Follow-up Information     [facility] Follow up.   Specialty: Obstetrics and Gynecology Why: as scheduled for ongoing prenatal care Contact information: [address] [place]  [address] [phone]                Allergies as of 01/24/2025       Reactions   Gluten Meal Other (See Comments), Diarrhea        Medication List     TAKE these medications    Accu-Chek Guide w/Device Kit 1 Device by Does not apply route 4 (four) times daily.   Accu-Chek  Softclix Lancets lancets Use as instructed   acetaminophen  500 MG tablet Commonly known as: TYLENOL  Take 500 mg by mouth every 6 (six) hours as needed for mild pain.   aspirin  EC 81 MG tablet Take 1 tablet (81 mg total) by mouth daily. Take after 12 weeks for prevention of preeclampsia later in pregnancy   glucose blood test strip Use as instructed   multivitamin-prenatal 27-0.8 MG Tabs  tablet Take 1 tablet by mouth daily at 12 noon.       Cornell Finder, CNM, MSN, IBCLC Certified Nurse Midwife, [facility]        [1]  Social History Tobacco Use   Smoking status: Former    Types: Cigarettes   Smokeless tobacco: Never  Vaping Use   Vaping status: Never Used  Substance Use Topics   Alcohol use: Not Currently   Drug use: No  [2]  Allergies Allergen Reactions   Gluten Meal Other (See Comments) and Diarrhea

## 2025-01-24 NOTE — MAU Note (Signed)
" (   1st -C/S - failed induction  And 2nd - repeat )  This baby - VBAC  Pt says feels UC's - strong since 5-6pm. H/A started  at 8 pm- 4/10  now 6/10 Back pain - started with UC's - BP- at 830pm- 130/86 . Then called Dr.  VERL

## 2025-01-27 ENCOUNTER — Encounter: Admitting: Obstetrics & Gynecology

## 2025-02-05 ENCOUNTER — Encounter: Admitting: Obstetrics and Gynecology

## 2025-02-06 ENCOUNTER — Inpatient Hospital Stay (HOSPITAL_COMMUNITY)

## 2025-02-06 ENCOUNTER — Inpatient Hospital Stay (HOSPITAL_COMMUNITY): Admission: RE | Admit: 2025-02-06 | Source: Home / Self Care | Admitting: Family Medicine

## 2025-02-10 ENCOUNTER — Encounter: Admitting: Obstetrics and Gynecology

## 2025-06-13 ENCOUNTER — Encounter: Admitting: Family
